# Patient Record
Sex: Female | Born: 1975
Health system: Southern US, Community
[De-identification: ages and names within clinical notes are randomized; demographics above are authoritative.]

## PROBLEM LIST (undated history)

## (undated) DIAGNOSIS — R7303 Prediabetes: Secondary | ICD-10-CM

## (undated) DIAGNOSIS — G4730 Sleep apnea, unspecified: Secondary | ICD-10-CM

## (undated) DIAGNOSIS — B009 Herpesviral infection, unspecified: Secondary | ICD-10-CM

## (undated) DIAGNOSIS — K5900 Constipation, unspecified: Secondary | ICD-10-CM

## (undated) DIAGNOSIS — M1990 Unspecified osteoarthritis, unspecified site: Secondary | ICD-10-CM

## (undated) DIAGNOSIS — N879 Dysplasia of cervix uteri, unspecified: Secondary | ICD-10-CM

## (undated) DIAGNOSIS — R0683 Snoring: Secondary | ICD-10-CM

## (undated) DIAGNOSIS — R87620 Atypical squamous cells of undetermined significance on cytologic smear of vagina (ASC-US): Secondary | ICD-10-CM

## (undated) DIAGNOSIS — M722 Plantar fascial fibromatosis: Secondary | ICD-10-CM

## (undated) DIAGNOSIS — E669 Obesity, unspecified: Secondary | ICD-10-CM

## (undated) DIAGNOSIS — K219 Gastro-esophageal reflux disease without esophagitis: Secondary | ICD-10-CM

## (undated) DIAGNOSIS — G43909 Migraine, unspecified, not intractable, without status migrainosus: Secondary | ICD-10-CM

## (undated) DIAGNOSIS — E559 Vitamin D deficiency, unspecified: Secondary | ICD-10-CM

## (undated) HISTORY — DX: Atypical squamous cells of undetermined significance on cytologic smear of vagina (ASC-US): R87.620

## (undated) HISTORY — PX: COLPOSCOPY: SHX161

## (undated) HISTORY — DX: Unspecified osteoarthritis, unspecified site: M19.90

## (undated) HISTORY — DX: Obesity, unspecified: E66.9

## (undated) HISTORY — PX: ENDOMETRIAL ABLATION: SHX621

## (undated) HISTORY — DX: Vitamin D deficiency, unspecified: E55.9

## (undated) HISTORY — DX: Dysplasia of cervix uteri, unspecified: N87.9

## (undated) HISTORY — PX: LEEP: SHX91

## (undated) HISTORY — DX: Migraine, unspecified, not intractable, without status migrainosus: G43.909

## (undated) HISTORY — DX: Herpesviral infection, unspecified: B00.9

## (undated) HISTORY — DX: Constipation, unspecified: K59.00

## (undated) HISTORY — PX: TUBAL LIGATION: SHX77

## (undated) HISTORY — DX: Plantar fascial fibromatosis: M72.2

## (undated) HISTORY — DX: Snoring: R06.83

---

## 1998-10-22 HISTORY — PX: TUBAL LIGATION: SHX77

## 2005-03-13 ENCOUNTER — Ambulatory Visit: Payer: Self-pay | Admitting: Orthopedic Surgery

## 2009-10-22 HISTORY — PX: ENDOMETRIAL ABLATION: SHX621

## 2010-10-12 ENCOUNTER — Ambulatory Visit: Payer: Self-pay

## 2011-11-30 ENCOUNTER — Ambulatory Visit: Payer: Self-pay | Admitting: Family Medicine

## 2012-09-22 ENCOUNTER — Other Ambulatory Visit: Payer: Self-pay

## 2012-09-22 LAB — COMPREHENSIVE METABOLIC PANEL
Alkaline Phosphatase: 98 U/L (ref 50–136)
BUN: 7 mg/dL (ref 7–18)
Bilirubin,Total: 0.4 mg/dL (ref 0.2–1.0)
Calcium, Total: 8.7 mg/dL (ref 8.5–10.1)
Co2: 26 mmol/L (ref 21–32)
Creatinine: 0.64 mg/dL (ref 0.60–1.30)
EGFR (African American): >60
EGFR (Non-African Amer.): >60
Glucose: 89 mg/dL (ref 65–99)
Osmolality: 277 (ref 275–301)
SGOT(AST): 20 U/L (ref 15–37)
SGPT (ALT): 17 U/L (ref 12–78)

## 2013-09-16 ENCOUNTER — Ambulatory Visit: Payer: Self-pay | Admitting: Family Medicine

## 2013-09-21 LAB — HM PAP SMEAR: HM Pap smear: NORMAL

## 2013-09-23 ENCOUNTER — Other Ambulatory Visit: Payer: Self-pay

## 2013-09-23 LAB — LIPID PANEL
Cholesterol: 143 mg/dL (ref 0–200)
HDL Cholesterol: 56 mg/dL (ref 40–60)
Triglycerides: 165 mg/dL (ref 0–200)
VLDL Cholesterol, Calc: 33 mg/dL (ref 5–40)

## 2013-09-23 LAB — COMPREHENSIVE METABOLIC PANEL
Alkaline Phosphatase: 68 U/L
Anion Gap: 4 — ABNORMAL LOW (ref 7–16)
Bilirubin,Total: 0.5 mg/dL (ref 0.2–1.0)
Creatinine: 0.6 mg/dL (ref 0.60–1.30)
Glucose: 91 mg/dL (ref 65–99)
SGPT (ALT): 15 U/L (ref 12–78)
Total Protein: 6.9 g/dL (ref 6.4–8.2)

## 2014-09-29 ENCOUNTER — Ambulatory Visit: Payer: Self-pay

## 2014-09-29 LAB — COMPREHENSIVE METABOLIC PANEL
ALK PHOS: 71 U/L
ANION GAP: 6 — AB (ref 7–16)
Albumin: 3.4 g/dL (ref 3.4–5.0)
BUN: 8 mg/dL (ref 7–18)
Bilirubin,Total: 0.5 mg/dL (ref 0.2–1.0)
CALCIUM: 8.2 mg/dL — AB (ref 8.5–10.1)
Chloride: 107 mmol/L (ref 98–107)
Co2: 28 mmol/L (ref 21–32)
Creatinine: 0.69 mg/dL (ref 0.60–1.30)
EGFR (African American): >60
GLUCOSE: 84 mg/dL (ref 65–99)
OSMOLALITY: 279 (ref 275–301)
Potassium: 4.1 mmol/L (ref 3.5–5.1)
SGOT(AST): 18 U/L (ref 15–37)
SGPT (ALT): 15 U/L
SODIUM: 141 mmol/L (ref 136–145)
Total Protein: 7 g/dL (ref 6.4–8.2)

## 2014-09-29 LAB — TSH: Thyroid Stimulating Horm: 1.86 u[IU]/mL

## 2014-09-29 LAB — LIPID PANEL
Cholesterol: 157 mg/dL (ref 0–200)
HDL: 52 mg/dL (ref 40–60)
Ldl Cholesterol, Calc: 83 mg/dL (ref 0–100)
Triglycerides: 108 mg/dL (ref 0–200)
VLDL Cholesterol, Calc: 22 mg/dL (ref 5–40)

## 2014-10-01 LAB — LIPID PANEL
CHOLESTEROL: 157 mg/dL (ref 0–200)
HDL: 52 mg/dL (ref 35–70)
LDL CALC: 83 mg/dL
TRIGLYCERIDES: 108 mg/dL (ref 40–160)

## 2014-11-17 ENCOUNTER — Ambulatory Visit: Payer: Self-pay

## 2014-11-18 LAB — HM MAMMOGRAPHY

## 2015-02-18 ENCOUNTER — Encounter: Payer: Self-pay | Admitting: *Deleted

## 2015-04-15 ENCOUNTER — Other Ambulatory Visit: Payer: Self-pay | Admitting: Family Medicine

## 2015-04-15 ENCOUNTER — Telehealth: Payer: Self-pay

## 2015-04-15 MED ORDER — OMEPRAZOLE 40 MG PO CPDR: 40.0000 mg | DELAYED_RELEASE_CAPSULE | Freq: Every day | ORAL | Status: DC

## 2015-04-15 NOTE — Telephone Encounter (Signed)
Patient is requesting refill of Omeprazole. Also states she is ready to try another weight loss medication and states she needs it due to no energy; as Dr. Ancil Boozer and patient discuss during last visit.

## 2015-04-15 NOTE — Telephone Encounter (Signed)
Sent omeprazole but I can't change weight loss medication without seeing her

## 2015-04-15 NOTE — Telephone Encounter (Signed)
Patient states she spoke with Dr. Ancil Boozer on her last visit on 03/03/15, and there is no open appointments for a couple of weeks.

## 2015-05-04 ENCOUNTER — Ambulatory Visit (INDEPENDENT_AMBULATORY_CARE_PROVIDER_SITE_OTHER): Payer: 59 | Admitting: Family Medicine

## 2015-05-04 ENCOUNTER — Encounter: Payer: Self-pay | Admitting: Family Medicine

## 2015-05-04 VITALS — BP 112/68 | HR 86 | Temp 98.1°F | Resp 16 | Ht 62.0 in | Wt 183.0 lb

## 2015-05-04 DIAGNOSIS — G43009 Migraine without aura, not intractable, without status migrainosus: Secondary | ICD-10-CM

## 2015-05-04 DIAGNOSIS — K219 Gastro-esophageal reflux disease without esophagitis: Secondary | ICD-10-CM

## 2015-05-04 DIAGNOSIS — E669 Obesity, unspecified: Secondary | ICD-10-CM | POA: Diagnosis not present

## 2015-05-04 DIAGNOSIS — J3089 Other allergic rhinitis: Secondary | ICD-10-CM | POA: Diagnosis not present

## 2015-05-04 DIAGNOSIS — R0683 Snoring: Secondary | ICD-10-CM | POA: Insufficient documentation

## 2015-05-04 DIAGNOSIS — E66811 Obesity, class 1: Secondary | ICD-10-CM | POA: Insufficient documentation

## 2015-05-04 DIAGNOSIS — E559 Vitamin D deficiency, unspecified: Secondary | ICD-10-CM | POA: Insufficient documentation

## 2015-05-04 DIAGNOSIS — M722 Plantar fascial fibromatosis: Secondary | ICD-10-CM | POA: Insufficient documentation

## 2015-05-04 MED ORDER — PHENTERMINE HCL 37.5 MG PO CAPS: 37.5000 mg | ORAL_CAPSULE | ORAL | Status: DC

## 2015-05-04 NOTE — Progress Notes (Signed)
Name: Lori Beltran   MRN: [id number]    DOB: 1975-10-26   Date:05/04/2015       Progress Note  Subjective  Chief Complaint  Chief Complaint  Patient presents with  . Obesity    wants to try another weight loss med    HPI  Migraine headaches: episodes not as frequent now. Last episode one month ago. Tried Topamax but she stopped because it caused tingling. Taking otc medication, Triptans did not work for her.   Obesity: she has struggling with her weight for the past 10 years. She has tried Archivist without success. She took Adipex before her wedding last year and lost about 30 lbs and would like to try it again.    Environmental Allergies/Hives: she has been seeing Dr. Donneta Romberg, and since started allergy shots about 2 months ago she has not had any hives  Patient Active Problem List   Diagnosis Date Noted  . Migraine without aura and without status migrainosus, not intractable 05/04/2015  . Environmental and seasonal allergies 05/04/2015  . Obesity (BMI 30.0-34.9) 05/04/2015  . Plantar fasciitis of left foot 05/04/2015  . Vitamin D deficiency 05/04/2015  . Snoring 05/04/2015    Past Surgical History  Procedure Laterality Date  . Tubal ligation    . Endometrial ablation      Family History  Problem Relation Age of Onset  . Sleep apnea Mother   . Sleep apnea Father     History   Social History  . Marital Status: Divorced    Spouse Name: N/A  . Number of Children: N/A  . Years of Education: N/A   Occupational History  . Not on file.   Social History Main Topics  . Smoking status: Never Smoker   . Smokeless tobacco: Never Used  . Alcohol Use: No  . Drug Use: No  . Sexual Activity: Yes   Other Topics Concern  . Not on file   Social History Narrative  . No narrative on file     Current outpatient prescriptions:  .  omeprazole (PRILOSEC) 40 MG capsule, Take 1 capsule (40 mg total) by mouth daily., Disp: 30 capsule, Rfl: 2 .  phentermine  37.5 MG capsule, Take 1 capsule (37.5 mg total) by mouth every morning., Disp: 30 capsule, Rfl: 0  No Known Allergies   ROS  Constitutional: Negative for fever but positive for  weight change.  Respiratory: Negative for cough and shortness of breath.   Cardiovascular: Negative for chest pain or palpitations.  Gastrointestinal: Negative for abdominal pain, no bowel changes.  Musculoskeletal: Negative for gait problem or joint swelling.  Skin: Negative for rash.  Neurological: Negative for dizziness or headache.  No other specific complaints in a complete review of systems (except as listed in HPI above).  Objective  Filed Vitals:   05/04/15 1052  BP: 112/68  Pulse: 86  Temp: 98.1 F (36.7 C)  TempSrc: Oral  Resp: 16  Height: 5\' 2"  (1.575 m)  Weight: 183 lb (83.008 kg)  SpO2: 96%    Body mass index is 33.46 kg/(m^2).  Physical Exam   Constitutional: Patient appears well-developed and well-nourished. No distress. Obese Eyes:  No scleral icterus. PERL Neck: Normal range of motion. Neck supple. No thyromegaly Cardiovascular: Normal rate, regular rhythm and normal heart sounds.  No murmur heard. No BLE edema. Pulmonary/Chest: Effort normal and breath sounds normal. No respiratory distress. Abdominal: Soft.  There is no tenderness. Psychiatric: Patient has a normal mood and affect.  behavior is normal. Judgment and thought content normal.  PHQ2/9: Depression screen PHQ 2/9 05/04/2015  Decreased Interest 0  Down, Depressed, Hopeless 0  PHQ - 2 Score 0     Fall Risk: Fall Risk  05/04/2015  Falls in the past year? No     Assessment & Plan  1. Migraine without aura and without status migrainosus, not intractable Continue prn otc Excedrin migraine  2. Environmental and seasonal allergies Continue follow up with Dr. Donneta Romberg and allergy shots  3. Obesity (BMI 30.0-34.9)  we will try phentermine again, discussed possible side effects and importance of combining with  diet and exercise - phentermine 37.5 MG capsule; Take 1 capsule (37.5 mg total) by mouth every morning.  Dispense: 30 capsule; Refill: 0

## 2015-05-04 NOTE — Patient Instructions (Signed)

## 2015-07-13 ENCOUNTER — Ambulatory Visit: Payer: 59 | Admitting: Family Medicine

## 2015-07-15 ENCOUNTER — Ambulatory Visit (INDEPENDENT_AMBULATORY_CARE_PROVIDER_SITE_OTHER): Payer: 59 | Admitting: Family Medicine

## 2015-07-15 ENCOUNTER — Encounter: Payer: Self-pay | Admitting: Family Medicine

## 2015-07-15 VITALS — BP 118/68 | HR 88 | Temp 98.1°F | Resp 18 | Ht 62.0 in | Wt 174.7 lb

## 2015-07-15 DIAGNOSIS — J3089 Other allergic rhinitis: Secondary | ICD-10-CM

## 2015-07-15 DIAGNOSIS — G43009 Migraine without aura, not intractable, without status migrainosus: Secondary | ICD-10-CM

## 2015-07-15 DIAGNOSIS — K5900 Constipation, unspecified: Secondary | ICD-10-CM | POA: Diagnosis not present

## 2015-07-15 DIAGNOSIS — Z862 Personal history of diseases of the blood and blood-forming organs and certain disorders involving the immune mechanism: Secondary | ICD-10-CM | POA: Insufficient documentation

## 2015-07-15 DIAGNOSIS — B009 Herpesviral infection, unspecified: Secondary | ICD-10-CM | POA: Insufficient documentation

## 2015-07-15 DIAGNOSIS — E669 Obesity, unspecified: Secondary | ICD-10-CM

## 2015-07-15 DIAGNOSIS — K5909 Other constipation: Secondary | ICD-10-CM

## 2015-07-15 MED ORDER — PHENTERMINE HCL 37.5 MG PO CAPS: 37.5000 mg | ORAL_CAPSULE | ORAL | Status: DC

## 2015-07-15 NOTE — Progress Notes (Signed)
Name: Lori Beltran   MRN: [id number]    DOB: 07-19-1976   Date:07/15/2015       Progress Note  Subjective  Chief Complaint  Chief Complaint  Patient presents with  . Medication Refill  . Obesity    Doing well on Phentermine, and losted 3 more pounds since last visit. Gives patient more energy.  . Allergic Rhinitis     Well controlled  . Constipation    Uses Linzess as needs goes 2-3 weekly.  . Migraine    Has not had one recently, takes medication prn and will take Excedrin as a headache comes on  . Gastrophageal Reflux    Well controlled with medication, takes PRN    HPI  Migraine Headaches: episodes have been less frequent, no episodes in the past 2 months.  She states when she has an episode they are described as starting on nuchal area or the top of her head, described as sharp, associated with nausea, and occasional vomiting. She has phonophobia and photophobia. She has been taking prn Excedrin Migraine.  Environmental allergies and one episode of angioedema: seeing Dr. Donneta Romberg she is having allergy shots since January 2016. Feeling better. No recent angioedema  Obesity: she was given a prescription for Adipex in July, but started a week later and her starting weight was 181 lbs, she has lost 5 lbs since she started on medication ( 5% weight loss is 9 lbs ) . She takes medications 5 days weekly. She states she has noticed some dry mouth and increase in constipation, however is taking Linzess and is controlling the bowel movement frequency - 3 times weekly - takes Linzess prn   GERD: under control, she has intermittent heartburn, and takes medication prn. Symptoms less than once week.    Patient Active Problem List   Diagnosis Date Noted  . 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 Chronic constipation 07/15/2015  . History of iron deficiency anemia 07/15/2015  . Migraine without aura and without status migrainosus, not intractable 05/04/2015  . Environmental and  seasonal allergies 05/04/2015  . Obesity (BMI 30.0-34.9) 05/04/2015  . Plantar fasciitis of left foot 05/04/2015  . Vitamin D deficiency 05/04/2015  . Snoring 05/04/2015  . GERD (gastroesophageal reflux disease) 05/04/2015    Past Surgical History  Procedure Laterality Date  . Tubal ligation    . Endometrial ablation      Family History  Problem Relation Age of Onset  . Sleep apnea Mother   . Sleep apnea Father   . Allergic rhinitis Daughter   . Allergic rhinitis Son     Social History   Social History  . Marital Status: Married    Spouse Name: N/A  . Number of Children: N/A  . Years of Education: N/A   Occupational History  . Not on file.   Social History Main Topics  . Smoking status: Never Smoker   . Smokeless tobacco: Never Used  . Alcohol Use: No  . Drug Use: No  . Sexual Activity:    Partners: Male   Other Topics Concern  . Not on file   Social History Narrative     Current outpatient prescriptions:  .  EPINEPHrine (EPIPEN 2-PAK) 0.3 mg/0.3 mL IJ SOAJ injection, EPIPEN 2-PAK, 0.3MG /0.3ML (Injection Solution Auto-injector)  1 (one) Soln Auto-inj Soln Auto-inj prn anaphylaxis for 30 days  Quantity: 2;  Refills: 0   Ordered :11-Oct-2014  Cathrine Muster ;  Started 21-Sep-2014 Active, Disp: , Rfl:  .  hydrOXYzine (  ATARAX/VISTARIL) 25 MG tablet, Take by mouth., Disp: , Rfl:  .  Linaclotide (LINZESS) 145 MCG CAPS capsule, Take by mouth., Disp: , Rfl:  .  loratadine (CLARITIN) 10 MG tablet, Take by mouth., Disp: , Rfl:  .  meloxicam (MOBIC) 15 MG tablet, Take by mouth., Disp: , Rfl:  .  Multiple Vitamins-Minerals (MULTIVITAL) tablet, Take by mouth., Disp: , Rfl:  .  omeprazole (PRILOSEC) 40 MG capsule, Take 1 capsule (40 mg total) by mouth daily., Disp: 30 capsule, Rfl: 2 .  phentermine 37.5 MG capsule, Take 1 capsule (37.5 mg total) by mouth every morning., Disp: 30 capsule, Rfl: 0 .  ranitidine (ZANTAC) 150 MG tablet, Take by mouth., Disp: , Rfl:  .   SUMAtriptan (IMITREX) 100 MG tablet, Take by mouth., Disp: , Rfl:  .  valACYclovir (VALTREX) 500 MG tablet, , Disp: , Rfl: 12  No Known Allergies   ROS  Constitutional: Negative for fever . Positive for  weight change.  Respiratory: Negative for cough and shortness of breath.   Cardiovascular: Negative for chest pain or palpitations.  Gastrointestinal: Negative for abdominal pain, no bowel changes.  Musculoskeletal: Negative for gait problem or joint swelling.  Skin: Negative for rash.  Neurological: Negative for dizziness, intermittent  headache.  No other specific complaints in a complete review of systems (except as listed in HPI above).  Objective  Filed Vitals:   07/15/15 0907  BP: 118/68  Pulse: 88  Temp: 98.1 F (36.7 C)  TempSrc: Oral  Resp: 18  Height: 5\' 2"  (1.575 m)  Weight: 174 lb 11.2 oz (79.243 kg)  SpO2: 97%    Body mass index is 31.94 kg/(m^2).  Physical Exam  Constitutional: Patient appears well-developed and well-nourished. Obese No distress.  HEENT: head atraumatic, normocephalic, pupils equal and reactive to light, neck supple, throat within normal limits Cardiovascular: Normal rate, regular rhythm and normal heart sounds.  No murmur heard. No BLE edema. Pulmonary/Chest: Effort normal and breath sounds normal. No respiratory distress. Abdominal: Soft.  There is no tenderness. Psychiatric: Patient has a normal mood and affect. behavior is normal. Judgment and thought content normal.  PHQ2/9: Depression screen [facility] 2/9 07/15/2015 05/04/2015  Decreased Interest 0 0  Down, Depressed, Hopeless 0 0  PHQ - 2 Score 0 0    Fall Risk: Fall Risk  07/15/2015 05/04/2015  Falls in the past year? No No    Functional Status Survey: Is the patient deaf or have difficulty hearing?: No Does the patient have difficulty seeing, even when wearing glasses/contacts?: Yes (glasses) Does the patient have difficulty concentrating, remembering, or making decisions?:  No Does the patient have difficulty walking or climbing stairs?: No Does the patient have difficulty dressing or bathing?: No Does the patient have difficulty doing errands alone such as visiting a doctor's office or shopping?: No    Assessment & Plan  1. Migraine without aura and without status migrainosus, not intractable  Doing well, on prn Excedrin, no recent episodes  2. Obesity (BMI 30.0-34.9)  Doing well on medication, advised to increase water intake and try to take Linzess more often to control constipation  - phentermine 37.5 MG capsule; Take 1 capsule (37.5 mg total) by mouth every morning.  Dispense: 30 capsule; Refill: 0  3. Chronic constipation  Still has Linzess at home  4. Environmental and seasonal allergies  Continue follow up with Dr. Donneta Romberg

## 2015-08-24 ENCOUNTER — Other Ambulatory Visit: Payer: Self-pay

## 2015-08-24 ENCOUNTER — Other Ambulatory Visit: Payer: Self-pay | Admitting: Family Medicine

## 2015-08-24 MED ORDER — POLYETHYLENE GLYCOL 3350 17 GM/SCOOP PO POWD: 17.0000 g | Freq: Two times a day (BID) | ORAL | Status: DC | PRN

## 2015-08-24 MED ORDER — LINACLOTIDE 145 MCG PO CAPS: 145.0000 ug | ORAL_CAPSULE | Freq: Every day | ORAL | Status: DC

## 2015-08-24 NOTE — Telephone Encounter (Signed)
Patient requesting refill, Not sure if this is the right mirlax? It was under historical in her old chart?

## 2015-10-10 ENCOUNTER — Other Ambulatory Visit
Admission: RE | Admit: 2015-10-10 | Discharge: 2015-10-10 | Disposition: A | Discharge status: Home / Self Care | Payer: 59 | Source: Ambulatory Visit | Attending: Obstetrics and Gynecology | Admitting: Obstetrics and Gynecology

## 2015-10-10 DIAGNOSIS — Z0000 Encounter for general adult medical examination without abnormal findings: Secondary | ICD-10-CM | POA: Insufficient documentation

## 2015-10-10 DIAGNOSIS — E559 Vitamin D deficiency, unspecified: Secondary | ICD-10-CM | POA: Insufficient documentation

## 2015-10-10 LAB — LIPID PANEL
CHOL/HDL RATIO: 3.7 ratio
CHOLESTEROL: 155 mg/dL (ref 0–200)
HDL: 42 mg/dL (ref >40)
LDL CALC: 94 mg/dL (ref 0–99)
TRIGLYCERIDES: 96 mg/dL (ref <150)
VLDL: 19 mg/dL (ref 0–40)

## 2015-10-10 LAB — HEMOGLOBIN AND HEMATOCRIT, BLOOD
HCT: 37.7 % (ref 35.0–47.0)
Hemoglobin: 12.4 g/dL (ref 12.0–16.0)

## 2015-10-11 LAB — HIV ANTIBODY (ROUTINE TESTING W REFLEX): HIV Screen 4th Generation wRfx: NONREACTIVE

## 2015-10-11 LAB — HEPATITIS C ANTIBODY

## 2015-10-11 LAB — VITAMIN D 25 HYDROXY (VIT D DEFICIENCY, FRACTURES): Vit D, 25-Hydroxy: 17.5 ng/mL — ABNORMAL LOW (ref 30.0–100.0)

## 2015-10-11 LAB — RPR: RPR: NONREACTIVE

## 2015-10-12 LAB — HM PAP SMEAR: HM PAP: NEGATIVE

## 2015-10-27 DIAGNOSIS — J3089 Other allergic rhinitis: Secondary | ICD-10-CM | POA: Diagnosis not present

## 2015-10-27 DIAGNOSIS — J3081 Allergic rhinitis due to animal (cat) (dog) hair and dander: Secondary | ICD-10-CM | POA: Diagnosis not present

## 2015-10-27 DIAGNOSIS — J301 Allergic rhinitis due to pollen: Secondary | ICD-10-CM | POA: Diagnosis not present

## 2015-11-03 DIAGNOSIS — J301 Allergic rhinitis due to pollen: Secondary | ICD-10-CM | POA: Diagnosis not present

## 2015-11-03 DIAGNOSIS — J3081 Allergic rhinitis due to animal (cat) (dog) hair and dander: Secondary | ICD-10-CM | POA: Diagnosis not present

## 2015-11-03 DIAGNOSIS — J3089 Other allergic rhinitis: Secondary | ICD-10-CM | POA: Diagnosis not present

## 2015-11-10 DIAGNOSIS — J3081 Allergic rhinitis due to animal (cat) (dog) hair and dander: Secondary | ICD-10-CM | POA: Diagnosis not present

## 2015-11-10 DIAGNOSIS — J3089 Other allergic rhinitis: Secondary | ICD-10-CM | POA: Diagnosis not present

## 2015-11-10 DIAGNOSIS — J301 Allergic rhinitis due to pollen: Secondary | ICD-10-CM | POA: Diagnosis not present

## 2015-11-15 DIAGNOSIS — J301 Allergic rhinitis due to pollen: Secondary | ICD-10-CM | POA: Diagnosis not present

## 2015-11-15 DIAGNOSIS — J3081 Allergic rhinitis due to animal (cat) (dog) hair and dander: Secondary | ICD-10-CM | POA: Diagnosis not present

## 2015-11-15 DIAGNOSIS — J3089 Other allergic rhinitis: Secondary | ICD-10-CM | POA: Diagnosis not present

## 2015-11-22 ENCOUNTER — Other Ambulatory Visit: Payer: Self-pay | Admitting: Family Medicine

## 2015-11-22 DIAGNOSIS — Z1231 Encounter for screening mammogram for malignant neoplasm of breast: Secondary | ICD-10-CM

## 2015-11-22 DIAGNOSIS — J3089 Other allergic rhinitis: Secondary | ICD-10-CM | POA: Diagnosis not present

## 2015-11-22 DIAGNOSIS — J3081 Allergic rhinitis due to animal (cat) (dog) hair and dander: Secondary | ICD-10-CM | POA: Diagnosis not present

## 2015-11-22 DIAGNOSIS — J301 Allergic rhinitis due to pollen: Secondary | ICD-10-CM | POA: Diagnosis not present

## 2015-11-29 DIAGNOSIS — J3081 Allergic rhinitis due to animal (cat) (dog) hair and dander: Secondary | ICD-10-CM | POA: Diagnosis not present

## 2015-11-29 DIAGNOSIS — J3089 Other allergic rhinitis: Secondary | ICD-10-CM | POA: Diagnosis not present

## 2015-11-29 DIAGNOSIS — J301 Allergic rhinitis due to pollen: Secondary | ICD-10-CM | POA: Diagnosis not present

## 2015-12-01 ENCOUNTER — Telehealth: Payer: Self-pay

## 2015-12-01 MED ORDER — FLUTICASONE PROPIONATE 50 MCG/ACT NA SUSP: 2.0000 | Freq: Every day | NASAL | Status: DC

## 2015-12-01 NOTE — Telephone Encounter (Signed)
Patient would like to know if you could call her in a nasal spray due to nasal congestion? Thanks

## 2015-12-01 NOTE — Telephone Encounter (Signed)
Patient notified

## 2015-12-01 NOTE — Telephone Encounter (Signed)
done

## 2015-12-02 ENCOUNTER — Encounter: Payer: 59 | Attending: Family Medicine | Admitting: Dietician

## 2015-12-02 VITALS — Ht 61.0 in | Wt 174.8 lb

## 2015-12-02 DIAGNOSIS — E669 Obesity, unspecified: Secondary | ICD-10-CM

## 2015-12-02 DIAGNOSIS — Z713 Dietary counseling and surveillance: Secondary | ICD-10-CM | POA: Insufficient documentation

## 2015-12-02 NOTE — Progress Notes (Signed)
Notes from [employer] employee "self referral" nutrition session: Start time: 0900   End time: 1000  Met with employee to discuss her nutritional concerns and diet history. The employee's questions/concerns were also addressed.  We discussed the following topics:  Healthy Eating  Exercise  Weight Concerns  I also provided the following handouts as reinforcement of the educational session:  Planning a Balanced Meal  Sample menus and/or recipes: Quick and Healthy Meal Ideas, Smart Snacking   Physical activity: walking 20-30 min, treadmill or elliptical 40 min,3-4 times a week  Dietary Intake:  Usual eating pattern includes 3 meals and 2 snacks per day. Dining out frequency: 4 meals per week.  Breakfast: yoplait yogurt Snack: carrots or trail mix or grapes or dry cereal Lunch: salad or varitey of meats i.e fried chicken or baked chicken or salmon. Food brought in by drug reps 4x a week.   Other day: sandwich, carrots Snack: same as am  Supper: [place] burgers, boiled/baked chicken. Limits red meats. Salmon pattie. Frozen vegetables, occasional rice. Decreased pasta Snack: none Beverages: 80oz water daily. Used to drink tea at work, but has now stopped  Encouraged tracking food intake and regular weighing.  Set goals with patient input.

## 2015-12-02 NOTE — Patient Instructions (Signed)
   Include fruits and vegetables often during the day.   Use food lists and your meal plan to eat balanced meals. Include palm-size or less meat portions, fist-size or less for starch portions.   Limit added fats such as salad dressings, mayo, butter, creamy dips or sauces, bacon.  Keep up with your regular exercise -- great job so far!!  Track food intake using phone app such as MyFitnessPal or Lose It. Or use the LiveLifeWell website. At the very least keep a paper diary of what you eat.  Check weight regularly, ideally every day. Think of the weight as just a number, not something that defines who you are.

## 2015-12-06 ENCOUNTER — Ambulatory Visit
Admission: RE | Admit: 2015-12-06 | Discharge: 2015-12-06 | Disposition: A | Discharge status: Home / Self Care | Payer: 59 | Source: Ambulatory Visit | Attending: Family Medicine | Admitting: Family Medicine

## 2015-12-06 DIAGNOSIS — Z1231 Encounter for screening mammogram for malignant neoplasm of breast: Secondary | ICD-10-CM | POA: Diagnosis not present

## 2015-12-06 DIAGNOSIS — J3081 Allergic rhinitis due to animal (cat) (dog) hair and dander: Secondary | ICD-10-CM | POA: Diagnosis not present

## 2015-12-06 DIAGNOSIS — J3089 Other allergic rhinitis: Secondary | ICD-10-CM | POA: Diagnosis not present

## 2015-12-06 DIAGNOSIS — J301 Allergic rhinitis due to pollen: Secondary | ICD-10-CM | POA: Diagnosis not present

## 2015-12-15 DIAGNOSIS — J301 Allergic rhinitis due to pollen: Secondary | ICD-10-CM | POA: Diagnosis not present

## 2015-12-15 DIAGNOSIS — J3089 Other allergic rhinitis: Secondary | ICD-10-CM | POA: Diagnosis not present

## 2015-12-15 DIAGNOSIS — J3081 Allergic rhinitis due to animal (cat) (dog) hair and dander: Secondary | ICD-10-CM | POA: Diagnosis not present

## 2015-12-20 DIAGNOSIS — J301 Allergic rhinitis due to pollen: Secondary | ICD-10-CM | POA: Diagnosis not present

## 2015-12-20 DIAGNOSIS — J3089 Other allergic rhinitis: Secondary | ICD-10-CM | POA: Diagnosis not present

## 2015-12-20 DIAGNOSIS — J3081 Allergic rhinitis due to animal (cat) (dog) hair and dander: Secondary | ICD-10-CM | POA: Diagnosis not present

## 2015-12-27 DIAGNOSIS — J3089 Other allergic rhinitis: Secondary | ICD-10-CM | POA: Diagnosis not present

## 2015-12-27 DIAGNOSIS — J3081 Allergic rhinitis due to animal (cat) (dog) hair and dander: Secondary | ICD-10-CM | POA: Diagnosis not present

## 2015-12-27 DIAGNOSIS — J301 Allergic rhinitis due to pollen: Secondary | ICD-10-CM | POA: Diagnosis not present

## 2016-01-03 DIAGNOSIS — J3089 Other allergic rhinitis: Secondary | ICD-10-CM | POA: Diagnosis not present

## 2016-01-03 DIAGNOSIS — J3081 Allergic rhinitis due to animal (cat) (dog) hair and dander: Secondary | ICD-10-CM | POA: Diagnosis not present

## 2016-01-03 DIAGNOSIS — J301 Allergic rhinitis due to pollen: Secondary | ICD-10-CM | POA: Diagnosis not present

## 2016-01-10 DIAGNOSIS — J3089 Other allergic rhinitis: Secondary | ICD-10-CM | POA: Diagnosis not present

## 2016-01-10 DIAGNOSIS — J3081 Allergic rhinitis due to animal (cat) (dog) hair and dander: Secondary | ICD-10-CM | POA: Diagnosis not present

## 2016-01-10 DIAGNOSIS — J301 Allergic rhinitis due to pollen: Secondary | ICD-10-CM | POA: Diagnosis not present

## 2016-01-17 DIAGNOSIS — J3089 Other allergic rhinitis: Secondary | ICD-10-CM | POA: Diagnosis not present

## 2016-01-17 DIAGNOSIS — J301 Allergic rhinitis due to pollen: Secondary | ICD-10-CM | POA: Diagnosis not present

## 2016-01-17 DIAGNOSIS — J3081 Allergic rhinitis due to animal (cat) (dog) hair and dander: Secondary | ICD-10-CM | POA: Diagnosis not present

## 2016-01-26 DIAGNOSIS — J301 Allergic rhinitis due to pollen: Secondary | ICD-10-CM | POA: Diagnosis not present

## 2016-01-26 DIAGNOSIS — J3089 Other allergic rhinitis: Secondary | ICD-10-CM | POA: Diagnosis not present

## 2016-01-26 DIAGNOSIS — J3081 Allergic rhinitis due to animal (cat) (dog) hair and dander: Secondary | ICD-10-CM | POA: Diagnosis not present

## 2016-02-02 DIAGNOSIS — J3081 Allergic rhinitis due to animal (cat) (dog) hair and dander: Secondary | ICD-10-CM | POA: Diagnosis not present

## 2016-02-02 DIAGNOSIS — J3089 Other allergic rhinitis: Secondary | ICD-10-CM | POA: Diagnosis not present

## 2016-02-02 DIAGNOSIS — J301 Allergic rhinitis due to pollen: Secondary | ICD-10-CM | POA: Diagnosis not present

## 2016-02-07 DIAGNOSIS — J301 Allergic rhinitis due to pollen: Secondary | ICD-10-CM | POA: Diagnosis not present

## 2016-02-07 DIAGNOSIS — J3081 Allergic rhinitis due to animal (cat) (dog) hair and dander: Secondary | ICD-10-CM | POA: Diagnosis not present

## 2016-02-07 DIAGNOSIS — J3089 Other allergic rhinitis: Secondary | ICD-10-CM | POA: Diagnosis not present

## 2016-02-17 DIAGNOSIS — J301 Allergic rhinitis due to pollen: Secondary | ICD-10-CM | POA: Diagnosis not present

## 2016-02-17 DIAGNOSIS — J3089 Other allergic rhinitis: Secondary | ICD-10-CM | POA: Diagnosis not present

## 2016-02-17 DIAGNOSIS — J3081 Allergic rhinitis due to animal (cat) (dog) hair and dander: Secondary | ICD-10-CM | POA: Diagnosis not present

## 2016-02-23 DIAGNOSIS — J3081 Allergic rhinitis due to animal (cat) (dog) hair and dander: Secondary | ICD-10-CM | POA: Diagnosis not present

## 2016-02-23 DIAGNOSIS — J301 Allergic rhinitis due to pollen: Secondary | ICD-10-CM | POA: Diagnosis not present

## 2016-02-23 DIAGNOSIS — J3089 Other allergic rhinitis: Secondary | ICD-10-CM | POA: Diagnosis not present

## 2016-02-28 DIAGNOSIS — J3089 Other allergic rhinitis: Secondary | ICD-10-CM | POA: Diagnosis not present

## 2016-02-28 DIAGNOSIS — J301 Allergic rhinitis due to pollen: Secondary | ICD-10-CM | POA: Diagnosis not present

## 2016-02-28 DIAGNOSIS — J3081 Allergic rhinitis due to animal (cat) (dog) hair and dander: Secondary | ICD-10-CM | POA: Diagnosis not present

## 2016-03-01 DIAGNOSIS — J3089 Other allergic rhinitis: Secondary | ICD-10-CM | POA: Diagnosis not present

## 2016-03-01 DIAGNOSIS — J301 Allergic rhinitis due to pollen: Secondary | ICD-10-CM | POA: Diagnosis not present

## 2016-03-01 DIAGNOSIS — J3081 Allergic rhinitis due to animal (cat) (dog) hair and dander: Secondary | ICD-10-CM | POA: Diagnosis not present

## 2016-03-06 DIAGNOSIS — J3081 Allergic rhinitis due to animal (cat) (dog) hair and dander: Secondary | ICD-10-CM | POA: Diagnosis not present

## 2016-03-06 DIAGNOSIS — J301 Allergic rhinitis due to pollen: Secondary | ICD-10-CM | POA: Diagnosis not present

## 2016-03-06 DIAGNOSIS — J3089 Other allergic rhinitis: Secondary | ICD-10-CM | POA: Diagnosis not present

## 2016-03-09 DIAGNOSIS — J301 Allergic rhinitis due to pollen: Secondary | ICD-10-CM | POA: Diagnosis not present

## 2016-03-09 DIAGNOSIS — J3089 Other allergic rhinitis: Secondary | ICD-10-CM | POA: Diagnosis not present

## 2016-03-09 DIAGNOSIS — J3081 Allergic rhinitis due to animal (cat) (dog) hair and dander: Secondary | ICD-10-CM | POA: Diagnosis not present

## 2016-03-13 DIAGNOSIS — J301 Allergic rhinitis due to pollen: Secondary | ICD-10-CM | POA: Diagnosis not present

## 2016-03-13 DIAGNOSIS — J3089 Other allergic rhinitis: Secondary | ICD-10-CM | POA: Diagnosis not present

## 2016-03-13 DIAGNOSIS — J3081 Allergic rhinitis due to animal (cat) (dog) hair and dander: Secondary | ICD-10-CM | POA: Diagnosis not present

## 2016-03-20 DIAGNOSIS — J301 Allergic rhinitis due to pollen: Secondary | ICD-10-CM | POA: Diagnosis not present

## 2016-03-20 DIAGNOSIS — J3089 Other allergic rhinitis: Secondary | ICD-10-CM | POA: Diagnosis not present

## 2016-03-20 DIAGNOSIS — J3081 Allergic rhinitis due to animal (cat) (dog) hair and dander: Secondary | ICD-10-CM | POA: Diagnosis not present

## 2016-03-27 DIAGNOSIS — J3089 Other allergic rhinitis: Secondary | ICD-10-CM | POA: Diagnosis not present

## 2016-03-27 DIAGNOSIS — J301 Allergic rhinitis due to pollen: Secondary | ICD-10-CM | POA: Diagnosis not present

## 2016-03-27 DIAGNOSIS — J3081 Allergic rhinitis due to animal (cat) (dog) hair and dander: Secondary | ICD-10-CM | POA: Diagnosis not present

## 2016-03-29 ENCOUNTER — Other Ambulatory Visit: Payer: Self-pay | Admitting: Family Medicine

## 2016-03-29 ENCOUNTER — Ambulatory Visit (INDEPENDENT_AMBULATORY_CARE_PROVIDER_SITE_OTHER): Payer: 59 | Admitting: Family Medicine

## 2016-03-29 ENCOUNTER — Encounter: Payer: Self-pay | Admitting: Family Medicine

## 2016-03-29 VITALS — BP 114/66 | HR 92 | Temp 97.5°F | Resp 16 | Ht 61.0 in | Wt 163.8 lb

## 2016-03-29 DIAGNOSIS — K5909 Other constipation: Secondary | ICD-10-CM

## 2016-03-29 DIAGNOSIS — K5900 Constipation, unspecified: Secondary | ICD-10-CM

## 2016-03-29 DIAGNOSIS — G43009 Migraine without aura, not intractable, without status migrainosus: Secondary | ICD-10-CM

## 2016-03-29 DIAGNOSIS — K219 Gastro-esophageal reflux disease without esophagitis: Secondary | ICD-10-CM

## 2016-03-29 DIAGNOSIS — E559 Vitamin D deficiency, unspecified: Secondary | ICD-10-CM

## 2016-03-29 DIAGNOSIS — E669 Obesity, unspecified: Secondary | ICD-10-CM

## 2016-03-29 DIAGNOSIS — Z862 Personal history of diseases of the blood and blood-forming organs and certain disorders involving the immune mechanism: Secondary | ICD-10-CM | POA: Diagnosis not present

## 2016-03-29 DIAGNOSIS — J3089 Other allergic rhinitis: Secondary | ICD-10-CM

## 2016-03-29 MED ORDER — PHENTERMINE HCL 37.5 MG PO CAPS: 37.5000 mg | ORAL_CAPSULE | ORAL | Status: DC

## 2016-03-29 MED ORDER — POLYETHYLENE GLYCOL 3350 17 GM/SCOOP PO POWD: 17.0000 g | Freq: Two times a day (BID) | ORAL | Status: DC | PRN

## 2016-03-29 MED ORDER — LINACLOTIDE 145 MCG PO CAPS: 145.0000 ug | ORAL_CAPSULE | Freq: Every day | ORAL | Status: DC

## 2016-03-29 NOTE — Progress Notes (Signed)
Name: Lori Beltran   MRN: [id number]    DOB: 1976-01-23   Date:03/29/2016       Progress Note  Subjective  Chief Complaint  Chief Complaint  Patient presents with  . Obesity    Wants to talk about weight loss medication  . Migraine    Gets them monthly  . Gastroesophageal Reflux    Well controlled  . Urticaria    Going to allergist for shots weekly    HPI  Migraine Headaches: episodes are on average once a month. She takes Excedrin migraine prn, she has Imitrex but does not take it very often because she does not like the side effect.  She states when she has an episode they are described as starting on nuchal area or the top of her head, described as sharp, associated with nausea, and occasional vomiting. She has phonophobia and photophobia. She denies aura.  Environmental allergies and one episode of angioedema: seeing Dr. Donneta Romberg she is having allergy shots weekly since January 2016. Feeling better. No recent angioedema, she also denies any hives. She also states seasonal allergies also under control, nasal congestion is minimal.  Obesity: she was given a prescription for Adipex in July 2016 and again in September. It works well for her, she could not tolerate Contrave and Qsymia is too expensive. She tried Belviq but it did not work for her. She has noticed  dry mouth in the past and increase in constipation, but improved with increase water intake. She has lost 11 lbs since last visit in September 2016. She had one visit with a dietician and has decrease carbohydrate intake, however frustrated because she is no longer losing weight  Chronic Constipation: she takes Linzess every other day and MIralax occasionally. Bowel movements daily with this regiment. Side effects is diarrhea, but no soiling or accidents.  GERD: under control, she has intermittent heartburn, and takes medication prn. Symptoms less than once week. Advised to stop Omeprazole and only take Ranitidine prn  Patient  Active Problem List   Diagnosis Date Noted  . 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 Chronic constipation 07/15/2015  . History of iron deficiency anemia 07/15/2015  . Migraine without aura and without status migrainosus, not intractable 05/04/2015  . Environmental and seasonal allergies 05/04/2015  . Obesity (BMI 30.0-34.9) 05/04/2015  . Plantar fasciitis of left foot 05/04/2015  . Vitamin D deficiency 05/04/2015  . Snoring 05/04/2015  . GERD (gastroesophageal reflux disease) 05/04/2015    Past Surgical History  Procedure Laterality Date  . Tubal ligation    . Endometrial ablation      Family History  Problem Relation Age of Onset  . Sleep apnea Mother   . Sleep apnea Father   . Allergic rhinitis Daughter   . Allergic rhinitis Son   . Breast cancer Paternal Grandmother     Social History   Social History  . Marital Status: Married    Spouse Name: N/A  . Number of Children: N/A  . Years of Education: N/A   Occupational History  . Not on file.   Social History Main Topics  . Smoking status: Never Smoker   . Smokeless tobacco: Never Used  . Alcohol Use: No  . Drug Use: No  . Sexual Activity:    Partners: Male   Other Topics Concern  . Not on file   Social History Narrative     Current outpatient prescriptions:  .  EPINEPHrine (EPIPEN 2-PAK) 0.3 mg/0.3 mL IJ SOAJ injection,  EPIPEN 2-PAK, 0.3MG /0.3ML (Injection Solution Auto-injector)  1 (one) Soln Auto-inj Soln Auto-inj prn anaphylaxis for 30 days  Quantity: 2;  Refills: 0   Ordered :11-Oct-2014  Cathrine Muster ;  Started 21-Sep-2014 Active, Disp: , Rfl:  .  fluticasone (FLONASE) 50 MCG/ACT nasal spray, Place 2 sprays into both nostrils daily., Disp: 16 g, Rfl: 6 .  hydrOXYzine (ATARAX/VISTARIL) 25 MG tablet, Take by mouth., Disp: , Rfl:  .  linaclotide (LINZESS) 145 MCG CAPS capsule, Take 1 capsule (145 mcg total) by mouth daily., Disp: 30 capsule, Rfl: 2 .  loratadine (CLARITIN) 10 MG tablet, Take by  mouth., Disp: , Rfl:  .  Multiple Vitamins-Minerals (MULTIVITAL) tablet, Take by mouth., Disp: , Rfl:  .  phentermine 37.5 MG capsule, Take 1 capsule (37.5 mg total) by mouth every morning., Disp: 30 capsule, Rfl: 0 .  polyethylene glycol powder (GLYCOLAX/MIRALAX) powder, Take 17 g by mouth 2 (two) times daily as needed., Disp: 3350 g, Rfl: 2 .  ranitidine (ZANTAC) 150 MG tablet, Take by mouth., Disp: , Rfl:  .  SUMAtriptan (IMITREX) 100 MG tablet, Take by mouth., Disp: , Rfl:  .  valACYclovir (VALTREX) 500 MG tablet, , Disp: , Rfl: 12  No Known Allergies   ROS  Constitutional: Negative for fever, positive for  weight change - loss.  Respiratory: Negative for cough and shortness of breath.   Cardiovascular: Negative for chest pain or palpitations.  Gastrointestinal: Negative for abdominal pain, no bowel changes.  Musculoskeletal: Negative for gait problem or joint swelling.  Skin: Negative for rash.  Neurological: Negative for dizziness, positive for intermittent headache No other specific complaints in a complete review of systems (except as listed in HPI above).  Objective  Filed Vitals:   03/29/16 1119  BP: 114/66  Pulse: 92  Temp: 97.5 F (36.4 C)  TempSrc: Oral  Resp: 16  Height: 5\' 1"  (1.549 m)  Weight: 163 lb 12.8 oz (74.299 kg)  SpO2: 98%    Body mass index is 30.97 kg/(m^2).  Physical Exam  Constitutional: Patient appears well-developed and well-nourished. Obese No distress.  HEENT: head atraumatic, normocephalic, pupils equal and reactive to light,  neck supple, throat within normal limits Cardiovascular: Normal rate, regular rhythm and normal heart sounds.  No murmur heard. No BLE edema. Pulmonary/Chest: Effort normal and breath sounds normal. No respiratory distress. Abdominal: Soft.  There is no tenderness. Psychiatric: Patient has a normal mood and affect. behavior is normal. Judgment and thought content normal.  PHQ2/9: Depression screen [facility] 2/9  07/15/2015 05/04/2015  Decreased Interest 0 0  Down, Depressed, Hopeless 0 0  PHQ - 2 Score 0 0    Fall Risk: Fall Risk  07/15/2015 05/04/2015  Falls in the past year? No No    Assessment & Plan  1. Migraine without aura and without status migrainosus, not intractable  Continue prn medication   2. Obesity (BMI 30.0-34.9)  Discussed with the patient the risk posed by an increased BMI. Discussed importance of portion control, calorie counting and at least 150 minutes of physical activity weekly. Avoid sweet beverages and drink more water. Eat at least 6 servings of fruit and vegetables daily  Discussed all optiosns for weight loss medications including Belviq, Qsymia, Saxenda and Contrave. Discussed risk and benefits of each of them. She prefers going back on Adipex - phentermine 37.5 MG capsule; Take 1 capsule (37.5 mg total) by mouth every morning.  Dispense: 30 capsule; Refill: 0  3. Gastroesophageal reflux disease without esophagitis  Continue prn medication  4. History of iron deficiency anemia  Doing well   5. Chronic constipation  Discussed lower dose of Linzess and she will think about it - linaclotide (LINZESS) 145 MCG CAPS capsule; Take 1 capsule (145 mcg total) by mouth daily.  Dispense: 30 capsule; Refill: 2 - polyethylene glycol powder (GLYCOLAX/MIRALAX) powder; Take 17 g by mouth 2 (two) times daily as needed.  Dispense: 3350 g; Refill: 2  6. Environmental and seasonal allergies  Continue follow up with Dr. Donneta Romberg   7. Vitamin D deficiency  Continue otc supplements

## 2016-04-03 DIAGNOSIS — J3089 Other allergic rhinitis: Secondary | ICD-10-CM | POA: Diagnosis not present

## 2016-04-03 DIAGNOSIS — J301 Allergic rhinitis due to pollen: Secondary | ICD-10-CM | POA: Diagnosis not present

## 2016-04-03 DIAGNOSIS — J3081 Allergic rhinitis due to animal (cat) (dog) hair and dander: Secondary | ICD-10-CM | POA: Diagnosis not present

## 2016-04-17 DIAGNOSIS — J301 Allergic rhinitis due to pollen: Secondary | ICD-10-CM | POA: Diagnosis not present

## 2016-04-17 DIAGNOSIS — J3081 Allergic rhinitis due to animal (cat) (dog) hair and dander: Secondary | ICD-10-CM | POA: Diagnosis not present

## 2016-04-17 DIAGNOSIS — J3089 Other allergic rhinitis: Secondary | ICD-10-CM | POA: Diagnosis not present

## 2016-04-27 DIAGNOSIS — J3081 Allergic rhinitis due to animal (cat) (dog) hair and dander: Secondary | ICD-10-CM | POA: Diagnosis not present

## 2016-04-27 DIAGNOSIS — J3089 Other allergic rhinitis: Secondary | ICD-10-CM | POA: Diagnosis not present

## 2016-04-27 DIAGNOSIS — J301 Allergic rhinitis due to pollen: Secondary | ICD-10-CM | POA: Diagnosis not present

## 2016-04-30 ENCOUNTER — Ambulatory Visit: Payer: 59 | Admitting: Family Medicine

## 2016-04-30 ENCOUNTER — Ambulatory Visit: Payer: Self-pay | Admitting: Physician Assistant

## 2016-04-30 ENCOUNTER — Ambulatory Visit (INDEPENDENT_AMBULATORY_CARE_PROVIDER_SITE_OTHER): Payer: 59 | Admitting: Family Medicine

## 2016-04-30 ENCOUNTER — Encounter: Payer: Self-pay | Admitting: Family Medicine

## 2016-04-30 VITALS — BP 110/62 | HR 97 | Temp 98.6°F | Resp 18 | Wt 162.0 lb

## 2016-04-30 DIAGNOSIS — K5909 Other constipation: Secondary | ICD-10-CM

## 2016-04-30 DIAGNOSIS — K5900 Constipation, unspecified: Secondary | ICD-10-CM | POA: Diagnosis not present

## 2016-04-30 DIAGNOSIS — R1013 Epigastric pain: Secondary | ICD-10-CM

## 2016-04-30 DIAGNOSIS — R1033 Periumbilical pain: Secondary | ICD-10-CM

## 2016-04-30 DIAGNOSIS — R112 Nausea with vomiting, unspecified: Secondary | ICD-10-CM

## 2016-04-30 MED ORDER — OMEPRAZOLE 40 MG PO CPDR: 40.0000 mg | DELAYED_RELEASE_CAPSULE | Freq: Every day | ORAL | Status: DC

## 2016-04-30 MED ORDER — LINACLOTIDE 72 MCG PO CAPS: 72.0000 ug | ORAL_CAPSULE | Freq: Every day | ORAL | Status: DC

## 2016-04-30 NOTE — Progress Notes (Signed)
Name: Lori Beltran   MRN: [id number]    DOB: 1976-05-25   Date:04/30/2016       Progress Note  Subjective  Chief Complaint  Chief Complaint  Patient presents with  . Abdominal Pain    patient stated for the past month she has been having abdominal pains everytime she eats something. patient stated that she was vomitting today but normally it is just queasiness. patient stated that it has not been any changes with her bowel moverment. patient has generalized abdominal pain that stays in one place. patient stated that after she takes a Linzess she feels a release on the RLQ.    HPI  Abdominal pain: she states that for the past month she has noticed intermittent episodes of periumbilical pain, associated with nausea and vomiting, usually occurs after bowel movements. No fever, no blood in stools. She has chronic constipations and has Bristol scale of 7 after taking Linzess in am's but if she skips medications stools are Bristol scale of 1. She states sometimes the pain seems to localize on RLQ. She still has her gallbladder. She states it does not matter what kind of food she eats, at times even water can bother her. Vomit usually contains food fragments. Occasionally has regurgitation .   Patient Active Problem List   Diagnosis Date Noted  . 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 Chronic constipation 07/15/2015  . History of iron deficiency anemia 07/15/2015  . Migraine without aura and without status migrainosus, not intractable 05/04/2015  . Environmental and seasonal allergies 05/04/2015  . Obesity (BMI 30.0-34.9) 05/04/2015  . Plantar fasciitis of left foot 05/04/2015  . Vitamin D deficiency 05/04/2015  . Snoring 05/04/2015  . GERD (gastroesophageal reflux disease) 05/04/2015    Past Surgical History  Procedure Laterality Date  . Tubal ligation    . Endometrial ablation      Family History  Problem Relation Age of Onset  . Sleep apnea Mother   . Sleep apnea Father    . Allergic rhinitis Daughter   . Allergic rhinitis Son   . Breast cancer Paternal Grandmother     Social History   Social History  . Marital Status: Married    Spouse Name: N/A  . Number of Children: N/A  . Years of Education: N/A   Occupational History  . Not on file.   Social History Main Topics  . Smoking status: Never Smoker   . Smokeless tobacco: Never Used  . Alcohol Use: No  . Drug Use: No  . Sexual Activity:    Partners: Male   Other Topics Concern  . Not on file   Social History Narrative     Current outpatient prescriptions:  .  fluticasone (FLONASE) 50 MCG/ACT nasal spray, Place 2 sprays into both nostrils daily., Disp: 16 g, Rfl: 6 .  hydrOXYzine (ATARAX/VISTARIL) 25 MG tablet, Take by mouth., Disp: , Rfl:  .  linaclotide (LINZESS) 72 MCG capsule, Take 1 capsule (72 mcg total) by mouth daily., Disp: 30 capsule, Rfl: 2 .  loratadine (CLARITIN) 10 MG tablet, Take by mouth., Disp: , Rfl:  .  Multiple Vitamins-Minerals (MULTIVITAL) tablet, Take by mouth., Disp: , Rfl:  .  polyethylene glycol powder (GLYCOLAX/MIRALAX) powder, Take 17 g by mouth 2 (two) times daily as needed., Disp: 3350 g, Rfl: 2 .  ranitidine (ZANTAC) 150 MG tablet, Take by mouth., Disp: , Rfl:  .  EPINEPHrine (EPIPEN 2-PAK) 0.3 mg/0.3 mL IJ SOAJ injection, EPIPEN 2-PAK, 0.3MG /0.3ML (Injection Solution  Auto-injector)  1 (one) Soln Auto-inj Soln Auto-inj prn anaphylaxis for 30 days  Quantity: 2;  Refills: 0   Ordered :11-Oct-2014  Cathrine Muster ;  Started 21-Sep-2014 Active, Disp: , Rfl:  .  phentermine 37.5 MG capsule, Take 1 capsule (37.5 mg total) by mouth every morning. (Patient not taking: Reported on 04/30/2016), Disp: 30 capsule, Rfl: 0 .  valACYclovir (VALTREX) 500 MG tablet, Reported on 04/30/2016, Disp: , Rfl: 12  No Known Allergies   ROS  Ten systems reviewed and is negative except as mentioned in HPI   Objective  Filed Vitals:   04/30/16 1326  BP: 110/62  Pulse: 97  Temp: 98.6  F (37 C)  TempSrc: Oral  Resp: 18  Weight: 162 lb (73.483 kg)  SpO2: 97%    Body mass index is 30.63 kg/(m^2).  Physical Exam  Constitutional: Patient appears well-developed and well-nourished. Obese  No distress.  HEENT: head atraumatic, normocephalic, pupils equal and reactive to light,  neck supple, throat within normal limits Cardiovascular: Normal rate, regular rhythm and normal heart sounds.  No murmur heard. No BLE edema. Pulmonary/Chest: Effort normal and breath sounds normal. No respiratory distress. Abdominal: Soft.  There pain during palpation of epigastric and RUQ , normal bowel sounds.  Psychiatric: Patient has a normal mood and affect. behavior is normal. Judgment and thought content normal.  PHQ2/9: Depression screen [facility] 2/9 04/30/2016 07/15/2015 05/04/2015  Decreased Interest 0 0 0  Down, Depressed, Hopeless 0 0 0  PHQ - 2 Score 0 0 0     Fall Risk: Fall Risk  04/30/2016 07/15/2015 05/04/2015  Falls in the past year? No No No     Functional Status Survey: Is the patient deaf or have difficulty hearing?: No Does the patient have difficulty seeing, even when wearing glasses/contacts?: No Does the patient have difficulty concentrating, remembering, or making decisions?: No Does the patient have difficulty walking or climbing stairs?: No Does the patient have difficulty dressing or bathing?: No Does the patient have difficulty doing errands alone such as visiting a doctor's office or shopping?: No    Assessment & Plan  1. Periumbilical abdominal pain  Discussed getting labs first , consider gallbladder US if negative h. Pylori test and no improvement of symptoms.  - CBC with Differential/Platelet - Comprehensive metabolic panel - Lipase  2. Nausea and vomiting, vomiting of unspecified type  - CBC with Differential/Platelet - Comprehensive metabolic panel - Lipase  3. Chronic constipation  - linaclotide (LINZESS) 72 MCG capsule; Take 1 capsule (72 mcg  total) by mouth daily.  Dispense: 30 capsule; Refill: 2 Decrease dose, discussed KUB and soiling - but she would like to hold off for now  4. Dyspepsia  - H. pylori antigen, stool - omeprazole (PRILOSEC) 40 MG capsule; Take 1 capsule (40 mg total) by mouth daily.  Dispense: 30 capsule; Refill: 3

## 2016-04-30 NOTE — Addendum Note (Signed)
Addended by: Inda Coke on: 04/30/2016 01:53 PM   Modules accepted: Orders

## 2016-05-01 ENCOUNTER — Ambulatory Visit: Payer: Self-pay | Admitting: Physician Assistant

## 2016-05-01 LAB — COMPREHENSIVE METABOLIC PANEL
ALT: 10 U/L (ref 6–29)
AST: 12 U/L (ref 10–30)
Albumin: 3.9 g/dL (ref 3.6–5.1)
Alkaline Phosphatase: 71 U/L (ref 33–115)
BUN: 7 mg/dL (ref 7–25)
CHLORIDE: 106 mmol/L (ref 98–110)
CO2: 26 mmol/L (ref 20–31)
Calcium: 8.9 mg/dL (ref 8.6–10.2)
Creat: 0.72 mg/dL (ref 0.50–1.10)
GLUCOSE: 91 mg/dL (ref 65–99)
POTASSIUM: 4.1 mmol/L (ref 3.5–5.3)
Sodium: 140 mmol/L (ref 135–146)
Total Bilirubin: 0.5 mg/dL (ref 0.2–1.2)
Total Protein: 6.4 g/dL (ref 6.1–8.1)

## 2016-05-01 LAB — LIPASE: Lipase: 8 U/L (ref 7–60)

## 2016-05-01 LAB — CBC WITH DIFFERENTIAL/PLATELET
BASOS PCT: 1 %
Basophils Absolute: 65 cells/uL (ref 0–200)
EOS ABS: 195 {cells}/uL (ref 15–500)
Eosinophils Relative: 3 %
HCT: 37.4 % (ref 35.0–45.0)
Hemoglobin: 12.1 g/dL (ref 11.7–15.5)
LYMPHS PCT: 41 %
Lymphs Abs: 2665 cells/uL (ref 850–3900)
MCH: 27.3 pg (ref 27.0–33.0)
MCHC: 32.4 g/dL (ref 32.0–36.0)
MCV: 84.2 fL (ref 80.0–100.0)
MONOS PCT: 10 %
MPV: 9.6 fL (ref 7.5–12.5)
Monocytes Absolute: 650 cells/uL (ref 200–950)
Neutro Abs: 2925 cells/uL (ref 1500–7800)
Neutrophils Relative %: 45 %
PLATELETS: 414 10*3/uL — AB (ref 140–400)
RBC: 4.44 MIL/uL (ref 3.80–5.10)
RDW: 14.8 % (ref 11.0–15.0)
WBC: 6.5 10*3/uL (ref 3.8–10.8)

## 2016-05-01 LAB — H. PYLORI BREATH TEST: H. pylori Breath Test: NOT DETECTED

## 2016-05-04 DIAGNOSIS — J301 Allergic rhinitis due to pollen: Secondary | ICD-10-CM | POA: Diagnosis not present

## 2016-05-04 DIAGNOSIS — J3081 Allergic rhinitis due to animal (cat) (dog) hair and dander: Secondary | ICD-10-CM | POA: Diagnosis not present

## 2016-05-04 DIAGNOSIS — J3089 Other allergic rhinitis: Secondary | ICD-10-CM | POA: Diagnosis not present

## 2016-05-18 DIAGNOSIS — J301 Allergic rhinitis due to pollen: Secondary | ICD-10-CM | POA: Diagnosis not present

## 2016-05-18 DIAGNOSIS — J3081 Allergic rhinitis due to animal (cat) (dog) hair and dander: Secondary | ICD-10-CM | POA: Diagnosis not present

## 2016-05-18 DIAGNOSIS — J3089 Other allergic rhinitis: Secondary | ICD-10-CM | POA: Diagnosis not present

## 2016-05-25 DIAGNOSIS — J301 Allergic rhinitis due to pollen: Secondary | ICD-10-CM | POA: Diagnosis not present

## 2016-05-25 DIAGNOSIS — J3089 Other allergic rhinitis: Secondary | ICD-10-CM | POA: Diagnosis not present

## 2016-05-25 DIAGNOSIS — J3081 Allergic rhinitis due to animal (cat) (dog) hair and dander: Secondary | ICD-10-CM | POA: Diagnosis not present

## 2016-06-01 DIAGNOSIS — J301 Allergic rhinitis due to pollen: Secondary | ICD-10-CM | POA: Diagnosis not present

## 2016-06-01 DIAGNOSIS — J3089 Other allergic rhinitis: Secondary | ICD-10-CM | POA: Diagnosis not present

## 2016-06-01 DIAGNOSIS — J3081 Allergic rhinitis due to animal (cat) (dog) hair and dander: Secondary | ICD-10-CM | POA: Diagnosis not present

## 2016-06-08 DIAGNOSIS — J301 Allergic rhinitis due to pollen: Secondary | ICD-10-CM | POA: Diagnosis not present

## 2016-06-08 DIAGNOSIS — J3081 Allergic rhinitis due to animal (cat) (dog) hair and dander: Secondary | ICD-10-CM | POA: Diagnosis not present

## 2016-06-08 DIAGNOSIS — J3089 Other allergic rhinitis: Secondary | ICD-10-CM | POA: Diagnosis not present

## 2016-06-15 DIAGNOSIS — J301 Allergic rhinitis due to pollen: Secondary | ICD-10-CM | POA: Diagnosis not present

## 2016-06-15 DIAGNOSIS — J3089 Other allergic rhinitis: Secondary | ICD-10-CM | POA: Diagnosis not present

## 2016-06-15 DIAGNOSIS — J3081 Allergic rhinitis due to animal (cat) (dog) hair and dander: Secondary | ICD-10-CM | POA: Diagnosis not present

## 2016-06-22 DIAGNOSIS — J3089 Other allergic rhinitis: Secondary | ICD-10-CM | POA: Diagnosis not present

## 2016-06-22 DIAGNOSIS — J3081 Allergic rhinitis due to animal (cat) (dog) hair and dander: Secondary | ICD-10-CM | POA: Diagnosis not present

## 2016-06-22 DIAGNOSIS — J301 Allergic rhinitis due to pollen: Secondary | ICD-10-CM | POA: Diagnosis not present

## 2016-06-29 DIAGNOSIS — J3081 Allergic rhinitis due to animal (cat) (dog) hair and dander: Secondary | ICD-10-CM | POA: Diagnosis not present

## 2016-06-29 DIAGNOSIS — J301 Allergic rhinitis due to pollen: Secondary | ICD-10-CM | POA: Diagnosis not present

## 2016-06-29 DIAGNOSIS — J3089 Other allergic rhinitis: Secondary | ICD-10-CM | POA: Diagnosis not present

## 2016-07-06 DIAGNOSIS — J301 Allergic rhinitis due to pollen: Secondary | ICD-10-CM | POA: Diagnosis not present

## 2016-07-06 DIAGNOSIS — J3089 Other allergic rhinitis: Secondary | ICD-10-CM | POA: Diagnosis not present

## 2016-07-06 DIAGNOSIS — J3081 Allergic rhinitis due to animal (cat) (dog) hair and dander: Secondary | ICD-10-CM | POA: Diagnosis not present

## 2016-07-19 DIAGNOSIS — J301 Allergic rhinitis due to pollen: Secondary | ICD-10-CM | POA: Diagnosis not present

## 2016-07-19 DIAGNOSIS — J3089 Other allergic rhinitis: Secondary | ICD-10-CM | POA: Diagnosis not present

## 2016-08-03 DIAGNOSIS — J301 Allergic rhinitis due to pollen: Secondary | ICD-10-CM | POA: Diagnosis not present

## 2016-08-03 DIAGNOSIS — J3089 Other allergic rhinitis: Secondary | ICD-10-CM | POA: Diagnosis not present

## 2016-08-03 DIAGNOSIS — J3081 Allergic rhinitis due to animal (cat) (dog) hair and dander: Secondary | ICD-10-CM | POA: Diagnosis not present

## 2016-08-09 DIAGNOSIS — J301 Allergic rhinitis due to pollen: Secondary | ICD-10-CM | POA: Diagnosis not present

## 2016-08-09 DIAGNOSIS — J3081 Allergic rhinitis due to animal (cat) (dog) hair and dander: Secondary | ICD-10-CM | POA: Diagnosis not present

## 2016-08-09 DIAGNOSIS — J3089 Other allergic rhinitis: Secondary | ICD-10-CM | POA: Diagnosis not present

## 2016-08-10 DIAGNOSIS — J3081 Allergic rhinitis due to animal (cat) (dog) hair and dander: Secondary | ICD-10-CM | POA: Diagnosis not present

## 2016-08-10 DIAGNOSIS — J3089 Other allergic rhinitis: Secondary | ICD-10-CM | POA: Diagnosis not present

## 2016-08-10 DIAGNOSIS — J301 Allergic rhinitis due to pollen: Secondary | ICD-10-CM | POA: Diagnosis not present

## 2016-08-16 DIAGNOSIS — H1045 Other chronic allergic conjunctivitis: Secondary | ICD-10-CM | POA: Diagnosis not present

## 2016-08-16 DIAGNOSIS — J301 Allergic rhinitis due to pollen: Secondary | ICD-10-CM | POA: Diagnosis not present

## 2016-08-16 DIAGNOSIS — L509 Urticaria, unspecified: Secondary | ICD-10-CM | POA: Diagnosis not present

## 2016-08-16 DIAGNOSIS — J3081 Allergic rhinitis due to animal (cat) (dog) hair and dander: Secondary | ICD-10-CM | POA: Diagnosis not present

## 2016-08-16 DIAGNOSIS — J3089 Other allergic rhinitis: Secondary | ICD-10-CM | POA: Diagnosis not present

## 2016-08-24 DIAGNOSIS — J3089 Other allergic rhinitis: Secondary | ICD-10-CM | POA: Diagnosis not present

## 2016-08-24 DIAGNOSIS — J301 Allergic rhinitis due to pollen: Secondary | ICD-10-CM | POA: Diagnosis not present

## 2016-08-24 DIAGNOSIS — J3081 Allergic rhinitis due to animal (cat) (dog) hair and dander: Secondary | ICD-10-CM | POA: Diagnosis not present

## 2016-08-29 ENCOUNTER — Telehealth: Payer: Self-pay

## 2016-09-05 DIAGNOSIS — H5213 Myopia, bilateral: Secondary | ICD-10-CM | POA: Diagnosis not present

## 2016-09-05 DIAGNOSIS — H43313 Vitreous membranes and strands, bilateral: Secondary | ICD-10-CM | POA: Diagnosis not present

## 2016-09-05 DIAGNOSIS — H3589 Other specified retinal disorders: Secondary | ICD-10-CM | POA: Diagnosis not present

## 2016-09-06 DIAGNOSIS — J301 Allergic rhinitis due to pollen: Secondary | ICD-10-CM | POA: Diagnosis not present

## 2016-09-06 DIAGNOSIS — J3089 Other allergic rhinitis: Secondary | ICD-10-CM | POA: Diagnosis not present

## 2016-09-06 DIAGNOSIS — J3081 Allergic rhinitis due to animal (cat) (dog) hair and dander: Secondary | ICD-10-CM | POA: Diagnosis not present

## 2016-09-19 ENCOUNTER — Other Ambulatory Visit: Payer: Self-pay | Admitting: Family Medicine

## 2016-09-19 DIAGNOSIS — E559 Vitamin D deficiency, unspecified: Secondary | ICD-10-CM | POA: Diagnosis not present

## 2016-09-19 DIAGNOSIS — E538 Deficiency of other specified B group vitamins: Secondary | ICD-10-CM | POA: Diagnosis not present

## 2016-09-19 DIAGNOSIS — R51 Headache: Secondary | ICD-10-CM | POA: Diagnosis not present

## 2016-09-19 DIAGNOSIS — H35361 Drusen (degenerative) of macula, right eye: Secondary | ICD-10-CM | POA: Insufficient documentation

## 2016-09-19 DIAGNOSIS — G43019 Migraine without aura, intractable, without status migrainosus: Secondary | ICD-10-CM | POA: Diagnosis not present

## 2016-09-19 DIAGNOSIS — G43009 Migraine without aura, not intractable, without status migrainosus: Secondary | ICD-10-CM

## 2016-09-20 ENCOUNTER — Encounter (INDEPENDENT_AMBULATORY_CARE_PROVIDER_SITE_OTHER): Payer: 59 | Admitting: Ophthalmology

## 2016-09-20 DIAGNOSIS — H3530 Unspecified macular degeneration: Secondary | ICD-10-CM | POA: Diagnosis not present

## 2016-09-20 DIAGNOSIS — H43813 Vitreous degeneration, bilateral: Secondary | ICD-10-CM

## 2016-09-20 DIAGNOSIS — H2513 Age-related nuclear cataract, bilateral: Secondary | ICD-10-CM | POA: Diagnosis not present

## 2016-09-21 ENCOUNTER — Other Ambulatory Visit: Payer: Self-pay | Admitting: Neurology

## 2016-09-21 DIAGNOSIS — G43019 Migraine without aura, intractable, without status migrainosus: Secondary | ICD-10-CM

## 2016-10-02 NOTE — Telephone Encounter (Signed)
Erroneous Encounter

## 2016-10-03 ENCOUNTER — Ambulatory Visit: Payer: 59

## 2016-10-08 DIAGNOSIS — E559 Vitamin D deficiency, unspecified: Secondary | ICD-10-CM | POA: Diagnosis not present

## 2016-10-08 DIAGNOSIS — Z0000 Encounter for general adult medical examination without abnormal findings: Secondary | ICD-10-CM | POA: Diagnosis not present

## 2016-10-08 DIAGNOSIS — Z01419 Encounter for gynecological examination (general) (routine) without abnormal findings: Secondary | ICD-10-CM | POA: Diagnosis not present

## 2016-10-08 DIAGNOSIS — Z13220 Encounter for screening for lipoid disorders: Secondary | ICD-10-CM | POA: Diagnosis not present

## 2016-10-08 DIAGNOSIS — Z113 Encounter for screening for infections with a predominantly sexual mode of transmission: Secondary | ICD-10-CM | POA: Diagnosis not present

## 2016-10-08 DIAGNOSIS — N920 Excessive and frequent menstruation with regular cycle: Secondary | ICD-10-CM | POA: Diagnosis not present

## 2016-10-08 DIAGNOSIS — Z1329 Encounter for screening for other suspected endocrine disorder: Secondary | ICD-10-CM | POA: Diagnosis not present

## 2016-10-08 DIAGNOSIS — Z1239 Encounter for other screening for malignant neoplasm of breast: Secondary | ICD-10-CM | POA: Diagnosis not present

## 2016-10-11 ENCOUNTER — Other Ambulatory Visit
Admission: RE | Admit: 2016-10-11 | Discharge: 2016-10-11 | Disposition: A | Discharge status: Home / Self Care | Payer: 59 | Source: Ambulatory Visit | Attending: Obstetrics and Gynecology | Admitting: Obstetrics and Gynecology

## 2016-10-11 DIAGNOSIS — E079 Disorder of thyroid, unspecified: Secondary | ICD-10-CM | POA: Diagnosis not present

## 2016-10-11 DIAGNOSIS — E7800 Pure hypercholesterolemia, unspecified: Secondary | ICD-10-CM | POA: Diagnosis not present

## 2016-10-11 LAB — CBC
HEMATOCRIT: 36.1 % (ref 35.0–47.0)
HEMOGLOBIN: 12.3 g/dL (ref 12.0–16.0)
MCH: 29.1 pg (ref 26.0–34.0)
MCHC: 34.1 g/dL (ref 32.0–36.0)
MCV: 85.2 fL (ref 80.0–100.0)
Platelets: 388 10*3/uL (ref 150–440)
RBC: 4.24 MIL/uL (ref 3.80–5.20)
RDW: 14.7 % — AB (ref 11.5–14.5)
WBC: 6.6 10*3/uL (ref 3.6–11.0)

## 2016-10-11 LAB — LIPID PANEL
CHOL/HDL RATIO: 3.3 ratio
Cholesterol: 148 mg/dL (ref 0–200)
HDL: 45 mg/dL (ref >40)
LDL Cholesterol: 72 mg/dL (ref 0–99)
TRIGLYCERIDES: 156 mg/dL — AB (ref <150)
VLDL: 31 mg/dL (ref 0–40)

## 2016-10-11 LAB — TSH: TSH: 2.161 u[IU]/mL (ref 0.350–4.500)

## 2016-10-12 LAB — HIV ANTIBODY (ROUTINE TESTING W REFLEX): HIV Screen 4th Generation wRfx: NONREACTIVE

## 2016-10-12 LAB — RPR: RPR Ser Ql: NONREACTIVE

## 2016-10-12 LAB — HEPATITIS C ANTIBODY: HCV AB: 0.2 {s_co_ratio} (ref 0.0–0.9)

## 2016-10-29 ENCOUNTER — Other Ambulatory Visit: Payer: Self-pay | Admitting: Family Medicine

## 2016-10-29 DIAGNOSIS — Z87898 Personal history of other specified conditions: Secondary | ICD-10-CM

## 2016-10-29 MED ORDER — SCOPOLAMINE 1 MG/3DAYS TD PT72: 1.0000 | MEDICATED_PATCH | TRANSDERMAL | 12 refills | Status: DC

## 2016-12-06 ENCOUNTER — Other Ambulatory Visit: Payer: Self-pay | Admitting: Family Medicine

## 2016-12-06 DIAGNOSIS — Z1231 Encounter for screening mammogram for malignant neoplasm of breast: Secondary | ICD-10-CM

## 2016-12-11 DIAGNOSIS — J301 Allergic rhinitis due to pollen: Secondary | ICD-10-CM | POA: Diagnosis not present

## 2016-12-11 DIAGNOSIS — J3089 Other allergic rhinitis: Secondary | ICD-10-CM | POA: Diagnosis not present

## 2016-12-11 DIAGNOSIS — J3081 Allergic rhinitis due to animal (cat) (dog) hair and dander: Secondary | ICD-10-CM | POA: Diagnosis not present

## 2016-12-12 ENCOUNTER — Ambulatory Visit: Payer: Self-pay

## 2016-12-13 ENCOUNTER — Encounter: Payer: Self-pay | Admitting: Family Medicine

## 2016-12-13 ENCOUNTER — Other Ambulatory Visit: Payer: Self-pay | Admitting: Family Medicine

## 2016-12-13 MED ORDER — RANITIDINE HCL 150 MG PO TABS: 150.0000 mg | ORAL_TABLET | Freq: Two times a day (BID) | ORAL | 5 refills | Status: DC

## 2016-12-13 MED ORDER — FLUTICASONE PROPIONATE 50 MCG/ACT NA SUSP: 2.0000 | Freq: Every day | NASAL | 6 refills | Status: DC

## 2016-12-18 ENCOUNTER — Ambulatory Visit
Admission: RE | Admit: 2016-12-18 | Discharge: 2016-12-18 | Disposition: A | Discharge status: Home / Self Care | Payer: 59 | Source: Ambulatory Visit | Attending: Family Medicine | Admitting: Family Medicine

## 2016-12-18 DIAGNOSIS — Z1231 Encounter for screening mammogram for malignant neoplasm of breast: Secondary | ICD-10-CM | POA: Insufficient documentation

## 2017-01-11 ENCOUNTER — Other Ambulatory Visit: Payer: Self-pay | Admitting: Emergency Medicine

## 2017-01-11 MED ORDER — PREDNISONE 10 MG PO TABS: 10.0000 mg | ORAL_TABLET | Freq: Two times a day (BID) | ORAL | 0 refills | Status: DC

## 2017-01-11 MED ORDER — BENZONATATE 100 MG PO CAPS: 100.0000 mg | ORAL_CAPSULE | Freq: Four times a day (QID) | ORAL | 0 refills | Status: DC

## 2017-08-07 ENCOUNTER — Encounter: Payer: Self-pay | Admitting: Family Medicine

## 2017-08-07 ENCOUNTER — Other Ambulatory Visit: Payer: Self-pay | Admitting: Family Medicine

## 2017-08-07 ENCOUNTER — Ambulatory Visit (INDEPENDENT_AMBULATORY_CARE_PROVIDER_SITE_OTHER): Payer: 59 | Admitting: Family Medicine

## 2017-08-07 VITALS — BP 110/80 | HR 80 | Resp 14 | Ht 61.0 in | Wt 175.1 lb

## 2017-08-07 DIAGNOSIS — G43009 Migraine without aura, not intractable, without status migrainosus: Secondary | ICD-10-CM

## 2017-08-07 DIAGNOSIS — L819 Disorder of pigmentation, unspecified: Secondary | ICD-10-CM

## 2017-08-07 DIAGNOSIS — L83 Acanthosis nigricans: Secondary | ICD-10-CM

## 2017-08-07 DIAGNOSIS — B079 Viral wart, unspecified: Secondary | ICD-10-CM | POA: Diagnosis not present

## 2017-08-07 DIAGNOSIS — Z79899 Other long term (current) drug therapy: Secondary | ICD-10-CM

## 2017-08-07 DIAGNOSIS — E669 Obesity, unspecified: Secondary | ICD-10-CM

## 2017-08-07 DIAGNOSIS — K219 Gastro-esophageal reflux disease without esophagitis: Secondary | ICD-10-CM

## 2017-08-07 DIAGNOSIS — Z13220 Encounter for screening for lipoid disorders: Secondary | ICD-10-CM | POA: Diagnosis not present

## 2017-08-07 DIAGNOSIS — E66811 Obesity, class 1: Secondary | ICD-10-CM

## 2017-08-07 NOTE — Progress Notes (Signed)
Name: Lori Beltran   MRN: [id number]    DOB: Jan 01, 1976   Date:08/07/2017       Progress Note  Subjective  Chief Complaint  Chief Complaint  Patient presents with  . Nevus    HPI  Skin lesion: she states that she has been noticing increase in skin tags but there is one that is changing in color ( white ) and growing and she is concerned about cancer. It does not bleed but it is bothering her  Migraines: resolved since right ear lobe piercing Feb 2018, migraines used to be severe and had episodes about 2-3 times monthly plus daily headaches. Only two migraines since Feb, doing great  GERD: doing better, taking Zantac of Omeprazole prn only, at most once a week. Advised to continue H2 blocker prn but try to avoid PPI because of long term risk  Obesity: going to the gym 3 days a week, however gained weight. Discussed logging calorie intake.   Chronic constipation: she states it is better, she takes Linzess prn only, about once every 2 weeks, she drinks senna leaves tea and has been having bowel movements daily. She still has to strain.    Patient Active Problem List   Diagnosis Date Noted  . 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 Chronic constipation 07/15/2015  . History of iron deficiency anemia 07/15/2015  . Migraine without aura and without status migrainosus, not intractable 05/04/2015  . Environmental and seasonal allergies 05/04/2015  . Obesity (BMI 30.0-34.9) 05/04/2015  . Plantar fasciitis of left foot 05/04/2015  . Vitamin D deficiency 05/04/2015  . Snoring 05/04/2015  . GERD (gastroesophageal reflux disease) 05/04/2015    Past Surgical History:  Procedure Laterality Date  . ENDOMETRIAL ABLATION    . TUBAL LIGATION      Family History  Problem Relation Age of Onset  . Sleep apnea Mother   . Sleep apnea Father   . Allergic rhinitis Daughter   . Allergic rhinitis Son   . Breast cancer Paternal Grandmother    . Breast cancer Maternal Aunt     Social History   Social History  . Marital status: Married    Spouse name: N/A  . Number of children: N/A  . Years of education: N/A   Occupational History  . Not on file.   Social History Main Topics  . Smoking status: Never Smoker  . Smokeless tobacco: Never Used  . Alcohol use No  . Drug use: No  . Sexual activity: Yes    Partners: Male   Other Topics Concern  . Not on file   Social History Narrative  . No narrative on file     Current Outpatient Prescriptions:  .  EPINEPHrine (EPIPEN 2-PAK) 0.3 mg/0.3 mL IJ SOAJ injection, EPIPEN 2-PAK, 0.3MG /0.3ML (Injection Solution Auto-injector)  1 (one) Soln Auto-inj Soln Auto-inj prn anaphylaxis for 30 days  Quantity: 2;  Refills: 0   Ordered :11-Oct-2014  Cathrine Muster ;  Started 21-Sep-2014 Active, Disp: , Rfl:  .  fluticasone (FLONASE) 50 MCG/ACT nasal spray, Place 2 sprays into both nostrils daily., Disp: 16 g, Rfl: 6 .  linaclotide (LINZESS) 72 MCG capsule, Take 1 capsule (72 mcg total) by mouth daily., Disp: 30 capsule, Rfl: 2 .  loratadine (CLARITIN) 10 MG tablet, Take by mouth., Disp: , Rfl:  .  Multiple Vitamins-Minerals (MULTIVITAL) tablet, Take by mouth., Disp: , Rfl:  .  omeprazole (PRILOSEC) 40 MG capsule, Take 1  capsule (40 mg total) by mouth daily., Disp: 30 capsule, Rfl: 3 .  ranitidine (ZANTAC) 150 MG tablet, Take 1 tablet (150 mg total) by mouth 2 (two) times daily., Disp: 60 tablet, Rfl: 5 .  valACYclovir (VALTREX) 500 MG tablet, Reported on 04/30/2016, Disp: , Rfl: 12 .  predniSONE (DELTASONE) 10 MG tablet, Take 1 tablet (10 mg total) by mouth 2 (two) times daily with a meal. (Patient not taking: Reported on 08/07/2017), Disp: 6 tablet, Rfl: 0  No Known Allergies   ROS  Constitutional: Negative for fever, positive for weight change.  Respiratory: Negative for cough and shortness of breath.   Cardiovascular: Negative for chest pain or palpitations.  Gastrointestinal:  Negative for abdominal pain, no bowel changes.  Musculoskeletal: Negative for gait problem or joint swelling.  Skin: Negative for rash.  Neurological: Negative for dizziness or headache.  No other specific complaints in a complete review of systems (except as listed in HPI above).  Objective  Vitals:   08/07/17 0741  BP: 110/80  Pulse: 80  Resp: 14  SpO2: 97%  Weight: 175 lb 1.6 oz (79.4 kg)  Height: 5\' 1"  (1.549 m)    Body mass index is 33.08 kg/m.  Physical Exam  Constitutional: Patient appears well-developed and well-nourished. Obese No distress.  HEENT: head atraumatic, normocephalic, pupils equal and reactive to light,  neck supple, throat within normal limits Cardiovascular: Normal rate, regular rhythm and normal heart sounds.  No murmur heard. No BLE edema. Pulmonary/Chest: Effort normal and breath sounds normal. No respiratory distress. Abdominal: Soft.  There is no tenderness. Skin acanthosis nigricans on her neck, also skin tags on neck ( larger one on the left anterior neck and white in color) Psychiatric: Patient has a normal mood and affect. behavior is normal. Judgment and thought content normal.  PHQ2/9: Depression screen [facility] 2/9 08/07/2017 04/30/2016 07/15/2015 05/04/2015  Decreased Interest 0 0 0 0  Down, Depressed, Hopeless 0 0 0 0  PHQ - 2 Score 0 0 0 0     Fall Risk: Fall Risk  08/07/2017 04/30/2016 07/15/2015 05/04/2015  Falls in the past year? No No No No     Assessment & Plan  1. Change in pigmented skin lesion  - Dermatology pathology ( for one of the skin tags on her neck)   Consent signed: YES  Procedure: Skin Mass Removal Location: neck has multiple skin tags, one looks longer, white and inflamed Equipment used: derma-blade, high temperature cautery, sterile scalpel, tweezers, curved scissors Anesthesia: 1% Lidocaine w/o Epinephrine  Cleaned and prepped: Betadine  After consent signed, are of skin prepped with betadine. Lidocaine w/o  epinephrine injected into skin underneath skin mass. After properly numbed sterile equipment used to remove tag.  Specimen sent for pathology analysis. Instructed on proper care to allow for proper healing. F/U for nursing visit if needed.  2. Acanthosis nigricans  - Hemoglobin A1c - Insulin, random  3. Gastroesophageal reflux disease without esophagitis  Doing well, taking medication prn   4. Migraine without aura and without status migrainosus, not intractable  Doing well since piercing on ear lobe  5. Obesity (BMI 30.0-34.9)  Discussed with the patient the risk posed by an increased BMI. Discussed importance of portion control, calorie counting and at least 150 minutes of physical activity weekly. Avoid sweet beverages and drink more water. Eat at least 6 servings of fruit and vegetables daily   6. Lipid screening  - Lipid panel  7. Long-term use of high-risk medication  -  COMPLETE METABOLIC PANEL WITH GFR

## 2017-08-09 LAB — TISSUE SPECIMEN

## 2017-08-09 LAB — PATHOLOGY

## 2017-08-27 ENCOUNTER — Ambulatory Visit: Payer: 59 | Admitting: Family Medicine

## 2017-08-29 ENCOUNTER — Ambulatory Visit (INDEPENDENT_AMBULATORY_CARE_PROVIDER_SITE_OTHER): Payer: 59 | Admitting: Family Medicine

## 2017-08-29 ENCOUNTER — Encounter: Payer: Self-pay | Admitting: Family Medicine

## 2017-08-29 VITALS — BP 114/76 | HR 81 | Temp 98.3°F | Resp 18 | Ht 61.0 in | Wt 177.0 lb

## 2017-08-29 DIAGNOSIS — E669 Obesity, unspecified: Secondary | ICD-10-CM | POA: Diagnosis not present

## 2017-08-29 DIAGNOSIS — R5383 Other fatigue: Secondary | ICD-10-CM | POA: Diagnosis not present

## 2017-08-29 DIAGNOSIS — F341 Dysthymic disorder: Secondary | ICD-10-CM

## 2017-08-29 DIAGNOSIS — E66811 Obesity, class 1: Secondary | ICD-10-CM

## 2017-08-29 DIAGNOSIS — E559 Vitamin D deficiency, unspecified: Secondary | ICD-10-CM | POA: Diagnosis not present

## 2017-08-29 DIAGNOSIS — K219 Gastro-esophageal reflux disease without esophagitis: Secondary | ICD-10-CM | POA: Diagnosis not present

## 2017-08-29 DIAGNOSIS — Z131 Encounter for screening for diabetes mellitus: Secondary | ICD-10-CM | POA: Diagnosis not present

## 2017-08-29 MED ORDER — OMEPRAZOLE 40 MG PO CPDR: 40.0000 mg | DELAYED_RELEASE_CAPSULE | Freq: Every day | ORAL | 3 refills | Status: DC

## 2017-08-29 NOTE — Progress Notes (Signed)
Name: Lori Beltran   MRN: [id number]    DOB: 11/06/1975   Date:08/29/2017       Progress Note  Subjective  Chief Complaint  Chief Complaint  Patient presents with  . Advice Only    HPI  Dysthymia/fatigue: she has noticed that over the past couple of months she has episodes that she feels sad, without knowing why. She states during episodes she has no energy, feels down and can last a couple of days. At times she wakes up feeling down but improves throughout the day. Her husband works as a Administrator - she has not talked to him about it. She misses when he is gone for months out of time. Her daughter is also a senior in [employer] and they are really close, she is already anticipating her being gone. Her husband also had recently eye surgery and she was really worried about it. She has a history of anemia and vitamin D deficiency and is not taking any supplements. Discussed options. Medication, counseling. She will try counseling through work, and will resume journaling.   Patient Active Problem List   Diagnosis Date Noted  . 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 Chronic constipation 07/15/2015  . History of iron deficiency anemia 07/15/2015  . Migraine without aura and without status migrainosus, not intractable 05/04/2015  . Environmental and seasonal allergies 05/04/2015  . Obesity (BMI 30.0-34.9) 05/04/2015  . Plantar fasciitis of left foot 05/04/2015  . Vitamin D deficiency 05/04/2015  . Snoring 05/04/2015  . GERD (gastroesophageal reflux disease) 05/04/2015    Past Surgical History:  Procedure Laterality Date  . ENDOMETRIAL ABLATION    . TUBAL LIGATION      Family History  Problem Relation Age of Onset  . Sleep apnea Mother   . Sleep apnea Father   . Allergic rhinitis Daughter   . Allergic rhinitis Son   . Breast cancer Paternal Grandmother   . Breast cancer Maternal Aunt     Social History   Socioeconomic History  .  Marital status: Married    Spouse name: Not on file  . Number of children: Not on file  . Years of education: Not on file  . Highest education level: Not on file  Social Needs  . Financial resource strain: Not on file  . Food insecurity - worry: Not on file  . Food insecurity - inability: Not on file  . Transportation needs - medical: Not on file  . Transportation needs - non-medical: Not on file  Occupational History  . Not on file  Tobacco Use  . Smoking status: Never Smoker  . Smokeless tobacco: Never Used  Substance and Sexual Activity  . Alcohol use: No    Alcohol/week: 0.0 oz  . Drug use: No  . Sexual activity: Yes    Partners: Male  Other Topics Concern  . Not on file  Social History Narrative  . Not on file     Current Outpatient Medications:  .  EPINEPHrine (EPIPEN 2-PAK) 0.3 mg/0.3 mL IJ SOAJ injection, EPIPEN 2-PAK, 0.3MG /0.3ML (Injection Solution Auto-injector)  1 (one) Soln Auto-inj Soln Auto-inj prn anaphylaxis for 30 days  Quantity: 2;  Refills: 0   Ordered :11-Oct-2014  Cathrine Muster ;  Started 21-Sep-2014 Active, Disp: , Rfl:  .  fluticasone (FLONASE) 50 MCG/ACT nasal spray, Place 2 sprays into both nostrils daily., Disp: 16 g, Rfl: 6 .  linaclotide (LINZESS) 72 MCG capsule, Take 1  capsule (72 mcg total) by mouth daily., Disp: 30 capsule, Rfl: 2 .  loratadine (CLARITIN) 10 MG tablet, Take by mouth., Disp: , Rfl:  .  Multiple Vitamins-Minerals (MULTIVITAL) tablet, Take by mouth., Disp: , Rfl:  .  omeprazole (PRILOSEC) 40 MG capsule, Take 1 capsule (40 mg total) daily by mouth., Disp: 30 capsule, Rfl: 3 .  ranitidine (ZANTAC) 150 MG tablet, Take 1 tablet (150 mg total) by mouth 2 (two) times daily., Disp: 60 tablet, Rfl: 5 .  valACYclovir (VALTREX) 500 MG tablet, Reported on 04/30/2016, Disp: , Rfl: 12  No Known Allergies   ROS  Constitutional: Negative for fever or significant weight change.  Respiratory: Negative for cough and shortness of breath.    Cardiovascular: Negative for chest pain or palpitations.  Gastrointestinal: Negative for abdominal pain, no bowel changes.  Musculoskeletal: Negative for gait problem or joint swelling.  Skin: Negative for rash.  Neurological: Negative for dizziness or headache.  No other specific complaints in a complete review of systems (except as listed in HPI above).  Objective  Vitals:   08/29/17 0851  BP: 114/76  Pulse: 81  Resp: 18  Temp: 98.3 F (36.8 C)  TempSrc: Oral  SpO2: 97%  Weight: 177 lb (80.3 kg)  Height: 5\' 1"  (1.549 m)    Body mass index is 33.44 kg/m.  Physical Exam  Constitutional: Patient appears well-developed and well-nourished. Obese  No distress.  HEENT: head atraumatic, normocephalic, pupils equal and reactive to light,  neck supple, throat within normal limits Cardiovascular: Normal rate, regular rhythm and normal heart sounds.  No murmur heard. No BLE edema. Pulmonary/Chest: Effort normal and breath sounds normal. No respiratory distress. Abdominal: Soft.  There is no tenderness. Psychiatric: Patient has a normal mood and affect. behavior is normal. Judgment and thought content normal.  Recent Results (from the past 2160 hour(s))  Pathology     Status: None   Collection Time: 08/07/17 10:58 AM  Result Value Ref Range   Relevant History:      Comment: L81.9, L83, history of skin tag, but this one  seems to be changing color     Pathologist:      Comment: Jerald L. Lovena Neighbours, MD, Board Certification in Anatomic/Clinical Pathology Electronically Signed   TISSUE SPECIMEN     Status: None   Collection Time: 08/07/17 10:58 AM  Result Value Ref Range   A Source      Comment: Skin Biopsy, left neck   A Gross Description      Comment: Received in a single container of formalin  labeled with two identifiers, patient name and  DOB, is a shave biopsy measuring 0.6 x 0.5 x 0.3  cm displaying a well-circumscribed, raised, flaky  and light tan lesion measuring  0.3 x 0.2 x 0.2  cm.  The surgical margin is inked red.  The  specimen is bisected and submitted entirely in  cassette A.     A Diagnosis      Comment: Inflamed verruca vulgaris.      PHQ2/9: Depression screen [facility] 2/9 08/29/2017 08/07/2017 04/30/2016 07/15/2015 05/04/2015  Decreased Interest 1 0 0 0 0  Down, Depressed, Hopeless 1 0 0 0 0  PHQ - 2 Score 2 0 0 0 0  Altered sleeping 1 - - - -  Tired, decreased energy 1 - - - -  Change in appetite 1 - - - -  Feeling bad or failure about yourself  1 - - - -  Trouble concentrating  0 - - - -  Moving slowly or fidgety/restless 0 - - - -  Suicidal thoughts 0 - - - -  PHQ-9 Score 6 - - - -  Difficult doing work/chores Somewhat difficult - - - -     Fall Risk: Fall Risk  08/29/2017 08/07/2017 04/30/2016 07/15/2015 05/04/2015  Falls in the past year? No No No No No     Functional Status Survey: Is the patient deaf or have difficulty hearing?: No Does the patient have difficulty seeing, even when wearing glasses/contacts?: No Does the patient have difficulty concentrating, remembering, or making decisions?: No Does the patient have difficulty walking or climbing stairs?: No Does the patient have difficulty dressing or bathing?: No Does the patient have difficulty doing errands alone such as visiting a doctor's office or shopping?: No    Assessment & Plan  1. Gastroesophageal reflux disease without esophagitis  - omeprazole (PRILOSEC) 40 MG capsule; Take 1 capsule (40 mg total) daily by mouth.  Dispense: 30 capsule; Refill: 3  2. Dysthymia  - CBC with Differential/Platelet - Vitamin B12 - VITAMIN D 25 Hydroxy (Vit-D Deficiency, Fractures)  3. Vitamin D deficiency  - VITAMIN D 25 Hydroxy (Vit-D Deficiency, Fractures)  4. Other fatigue  - CBC with Differential/Platelet - Vitamin B12 - VITAMIN D 25 Hydroxy (Vit-D Deficiency, Fractures) - COMPLETE METABOLIC PANEL WITH GFR - TSH  5. Obesity (BMI 30.0-34.9)  - TSH  6.  Diabetes mellitus screening  - Hemoglobin A1c

## 2017-10-04 DIAGNOSIS — H5213 Myopia, bilateral: Secondary | ICD-10-CM | POA: Diagnosis not present

## 2017-10-17 ENCOUNTER — Telehealth: Payer: Self-pay | Admitting: Obstetrics and Gynecology

## 2017-10-17 ENCOUNTER — Other Ambulatory Visit
Admission: RE | Admit: 2017-10-17 | Discharge: 2017-10-17 | Disposition: A | Discharge status: Home / Self Care | Payer: 59 | Source: Ambulatory Visit | Attending: Obstetrics and Gynecology | Admitting: Obstetrics and Gynecology

## 2017-10-17 ENCOUNTER — Ambulatory Visit (INDEPENDENT_AMBULATORY_CARE_PROVIDER_SITE_OTHER): Payer: 59 | Admitting: Obstetrics and Gynecology

## 2017-10-17 ENCOUNTER — Encounter: Payer: Self-pay | Admitting: Obstetrics and Gynecology

## 2017-10-17 VITALS — BP 100/70 | HR 73 | Ht 62.0 in | Wt 181.0 lb

## 2017-10-17 DIAGNOSIS — Z1239 Encounter for other screening for malignant neoplasm of breast: Secondary | ICD-10-CM

## 2017-10-17 DIAGNOSIS — Z01419 Encounter for gynecological examination (general) (routine) without abnormal findings: Secondary | ICD-10-CM | POA: Diagnosis not present

## 2017-10-17 DIAGNOSIS — Z1231 Encounter for screening mammogram for malignant neoplasm of breast: Secondary | ICD-10-CM

## 2017-10-17 DIAGNOSIS — A6004 Herpesviral vulvovaginitis: Secondary | ICD-10-CM | POA: Diagnosis not present

## 2017-10-17 DIAGNOSIS — Z0000 Encounter for general adult medical examination without abnormal findings: Secondary | ICD-10-CM

## 2017-10-17 DIAGNOSIS — Z113 Encounter for screening for infections with a predominantly sexual mode of transmission: Secondary | ICD-10-CM | POA: Diagnosis not present

## 2017-10-17 DIAGNOSIS — Z131 Encounter for screening for diabetes mellitus: Secondary | ICD-10-CM

## 2017-10-17 DIAGNOSIS — Z13220 Encounter for screening for lipoid disorders: Secondary | ICD-10-CM | POA: Diagnosis not present

## 2017-10-17 LAB — COMPREHENSIVE METABOLIC PANEL
ALBUMIN: 3.7 g/dL (ref 3.5–5.0)
ALT: 14 U/L (ref 14–54)
AST: 17 U/L (ref 15–41)
Alkaline Phosphatase: 78 U/L (ref 38–126)
Anion gap: 4 — ABNORMAL LOW (ref 5–15)
BUN: 9 mg/dL (ref 6–20)
CHLORIDE: 108 mmol/L (ref 101–111)
CO2: 25 mmol/L (ref 22–32)
CREATININE: 0.69 mg/dL (ref 0.44–1.00)
Calcium: 8.6 mg/dL — ABNORMAL LOW (ref 8.9–10.3)
GFR calc Af Amer: >60 mL/min (ref >60)
GFR calc non Af Amer: >60 mL/min (ref >60)
GLUCOSE: 102 mg/dL — AB (ref 65–99)
POTASSIUM: 3.9 mmol/L (ref 3.5–5.1)
Sodium: 137 mmol/L (ref 135–145)
Total Bilirubin: 0.4 mg/dL (ref 0.3–1.2)
Total Protein: 6.9 g/dL (ref 6.5–8.1)

## 2017-10-17 LAB — LIPID PANEL
CHOL/HDL RATIO: 3.2 ratio
Cholesterol: 172 mg/dL (ref 0–200)
HDL: 53 mg/dL (ref >40)
LDL CALC: 102 mg/dL — AB (ref 0–99)
Triglycerides: 83 mg/dL (ref <150)
VLDL: 17 mg/dL (ref 0–40)

## 2017-10-17 LAB — HEMOGLOBIN A1C
HEMOGLOBIN A1C: 5.7 % — AB (ref 4.8–5.6)
MEAN PLASMA GLUCOSE: 116.89 mg/dL

## 2017-10-17 MED ORDER — VALACYCLOVIR HCL 500 MG PO TABS: 500.0000 mg | ORAL_TABLET | Freq: Two times a day (BID) | ORAL | 1 refills | Status: DC

## 2017-10-17 NOTE — Telephone Encounter (Deleted)
10/23/17 AT 10:50 FOR NEXPLANON INSERT WITH ABC

## 2017-10-17 NOTE — Progress Notes (Signed)
PCP:  Steele Sizer, MD   Chief Complaint  Patient presents with  . Gynecologic Exam     HPI:      [name] is a 41 y.o. No obstetric history on file. who LMP was No LMP recorded. Patient has had an ablation., presents today for her annual examination.  Her menses are infrequent and light again. Pt was amenorrheic with ablation initially but had heavy periods last yr. Had neg labs and sx resolved. Pt doing well now.   Sex activity: single partner, contraception - tubal ligation.  Last Pap: October 06, 2015  Results were: no abnormalities  Hx of STDs: HSV, takes valtrex prn--needs Rx RF  Last mammo 12/18/16; WNL There is a FH of breast cancer in her pat aunt and PGM, genetic testing not indicated. There is no FH of ovarian cancer. The patient does do self-breast exams.  Tobacco use: The patient denies current or previous tobacco use. Alcohol use: none No drug use.  Exercise: not active  She does get adequate calcium and Vitamin D in her diet.  She would like labs done. FH hyperlipidemia and wants checked. Also wants STD screening--no sx/known exposures. Does at [facility] labs.   Past Medical History:  Diagnosis Date  . Cervical dysplasia    CINII  . Constipation   . Herpes   . Migraine   . Obesity   . Pap smear abnormality of vagina with ASC-US   . Plantar fasciitis   . Snoring   . Vitamin D deficiency     Past Surgical History:  Procedure Laterality Date  . COLPOSCOPY    . ENDOMETRIAL ABLATION    . LEEP    . TUBAL LIGATION      Family History  Problem Relation Age of Onset  . Sleep apnea Mother   . Sleep apnea Father   . Allergic rhinitis Daughter   . Allergic rhinitis Son   . Breast cancer Paternal Grandmother   . Brain cancer Paternal Grandmother   . Cancer Paternal Grandmother        breast  4  . Breast cancer Maternal Aunt   . Bone cancer Maternal Aunt   . Cancer Maternal Aunt        breast 80  . Colon cancer Maternal Uncle   . Lung  cancer Maternal Uncle   . Prostate cancer Maternal Uncle     Social History   Socioeconomic History  . Marital status: Married    Spouse name: Not on file  . Number of children: Not on file  . Years of education: Not on file  . Highest education level: Not on file  Social Needs  . Financial resource strain: Not on file  . Food insecurity - worry: Not on file  . Food insecurity - inability: Not on file  . Transportation needs - medical: Not on file  . Transportation needs - non-medical: Not on file  Occupational History  . Not on file  Tobacco Use  . Smoking status: Never Smoker  . Smokeless tobacco: Never Used  Substance and Sexual Activity  . Alcohol use: No    Alcohol/week: 0.0 oz  . Drug use: No  . Sexual activity: Yes    Partners: Male  Other Topics Concern  . Not on file  Social History Narrative  . Not on file    No outpatient medications have been marked as taking for the 10/17/17 encounter (Office Visit) with Copland, Deirdre Evener, PA-C.  ROS:  Review of Systems  Constitutional: Negative for fatigue, fever and unexpected weight change.  Respiratory: Negative for cough, shortness of breath and wheezing.   Cardiovascular: Negative for chest pain, palpitations and leg swelling.  Gastrointestinal: Negative for blood in stool, constipation, diarrhea, nausea and vomiting.  Endocrine: Negative for cold intolerance, heat intolerance and polyuria.  Genitourinary: Negative for dyspareunia, dysuria, flank pain, frequency, genital sores, hematuria, menstrual problem, pelvic pain, urgency, vaginal bleeding, vaginal discharge and vaginal pain.  Musculoskeletal: Negative for back pain, joint swelling and myalgias.  Skin: Negative for rash.  Neurological: Negative for dizziness, syncope, light-headedness, numbness and headaches.  Hematological: Negative for adenopathy.  Psychiatric/Behavioral: Negative for agitation, confusion, sleep disturbance and suicidal ideas. The  patient is not nervous/anxious.      Objective: BP 100/70   Pulse 73   Ht 5\' 2"  (1.575 m)   Wt 181 lb (82.1 kg)   BMI 33.11 kg/m    Physical Exam  Constitutional: She is oriented to person, place, and time. She appears well-developed and well-nourished.  Genitourinary: Vagina normal and uterus normal. There is no rash or tenderness on the right labia. There is no rash or tenderness on the left labia. No erythema or tenderness in the vagina. No vaginal discharge found. Right adnexum does not display mass and does not display tenderness. Left adnexum does not display mass and does not display tenderness. Cervix does not exhibit motion tenderness or polyp. Uterus is not enlarged or tender.  Neck: Normal range of motion. No thyromegaly present.  Cardiovascular: Normal rate, regular rhythm and normal heart sounds.  No murmur heard. Pulmonary/Chest: Effort normal and breath sounds normal. Right breast exhibits no mass, no nipple discharge, no skin change and no tenderness. Left breast exhibits no mass, no nipple discharge, no skin change and no tenderness.  Abdominal: Soft. There is no tenderness. There is no guarding.  Musculoskeletal: Normal range of motion.  Neurological: She is alert and oriented to person, place, and time. No cranial nerve deficit.  Psychiatric: She has a normal mood and affect. Her behavior is normal.  Vitals reviewed.   Assessment/Plan: Encounter for annual routine gynecological examination  Screening for breast cancer - Pt to sched mammo. - Plan: MM DIGITAL SCREENING BILATERAL  Blood tests for routine general physical examination - Plan: Comprehensive metabolic panel, Lipid panel, Hemoglobin A1c, Chlamydia/Gonococcus/Trichomonas, NAA  Screening cholesterol level - Plan: Lipid panel  Screening for diabetes mellitus - Plan: Hemoglobin A1c  Screening for STD (sexually transmitted disease) - Plan: Chlamydia/Gonococcus/Trichomonas, NAA, HIV antibody, RPR, Hepatitis  C Antibody  Herpes simplex vulvovaginitis - Rx RF valtrex - Plan: valACYclovir (VALTREX) 500 MG tablet  Meds ordered this encounter  Medications  . valACYclovir (VALTREX) 500 MG tablet    Sig: Take 1 tablet (500 mg total) by mouth 2 (two) times daily. For 3 days prn sx    Dispense:  30 tablet    Refill:  1             GYN counsel breast self exam, mammography screening, adequate intake of calcium and vitamin D, diet and exercise     F/U  Return in about 1 year (around 10/17/2018).  Alicia B. Copland, PA-C 10/17/2017 11:41 AM

## 2017-10-17 NOTE — Telephone Encounter (Deleted)
WRONG PT PLEASE IGNORE

## 2017-10-17 NOTE — Patient Instructions (Signed)
I value your feedback and entrusting us with your care. If you get a [facility] patient survey, I would appreciate you taking the time to let us know about your experience today. Thank you! 

## 2017-10-18 LAB — HEPATITIS C ANTIBODY: HCV Ab: <0.1 s/co ratio (ref 0.0–0.9)

## 2017-10-18 LAB — HIV ANTIBODY (ROUTINE TESTING W REFLEX): HIV SCREEN 4TH GENERATION: NONREACTIVE

## 2017-10-18 LAB — RPR: RPR Ser Ql: NONREACTIVE

## 2017-10-19 LAB — CHLAMYDIA/GONOCOCCUS/TRICHOMONAS, NAA
Chlamydia by NAA: NEGATIVE
Gonococcus by NAA: NEGATIVE
TRICH VAG BY NAA: NEGATIVE

## 2017-10-23 ENCOUNTER — Ambulatory Visit: Payer: 59 | Admitting: Obstetrics and Gynecology

## 2017-10-23 NOTE — Telephone Encounter (Signed)
Pt aware of lab results. Recommend diet/exericse/wt loss for LDL and HgA1C. Rechk in 1 yr.

## 2017-12-18 ENCOUNTER — Encounter (INDEPENDENT_AMBULATORY_CARE_PROVIDER_SITE_OTHER): Payer: Self-pay

## 2017-12-23 ENCOUNTER — Ambulatory Visit: Payer: Self-pay

## 2017-12-30 ENCOUNTER — Ambulatory Visit: Payer: 59 | Admitting: Family Medicine

## 2017-12-31 ENCOUNTER — Ambulatory Visit
Admission: RE | Admit: 2017-12-31 | Discharge: 2017-12-31 | Disposition: A | Discharge status: Home / Self Care | Payer: No Typology Code available for payment source | Source: Ambulatory Visit | Attending: Obstetrics and Gynecology | Admitting: Obstetrics and Gynecology

## 2017-12-31 DIAGNOSIS — Z1231 Encounter for screening mammogram for malignant neoplasm of breast: Secondary | ICD-10-CM | POA: Insufficient documentation

## 2017-12-31 DIAGNOSIS — Z1239 Encounter for other screening for malignant neoplasm of breast: Secondary | ICD-10-CM

## 2018-01-01 ENCOUNTER — Encounter: Payer: Self-pay | Admitting: Obstetrics and Gynecology

## 2018-01-02 ENCOUNTER — Encounter (INDEPENDENT_AMBULATORY_CARE_PROVIDER_SITE_OTHER): Payer: Self-pay

## 2018-01-02 ENCOUNTER — Ambulatory Visit (INDEPENDENT_AMBULATORY_CARE_PROVIDER_SITE_OTHER): Payer: 59 | Admitting: Family Medicine

## 2018-02-28 ENCOUNTER — Other Ambulatory Visit: Payer: Self-pay | Admitting: Family Medicine

## 2018-07-04 ENCOUNTER — Encounter: Payer: Self-pay | Admitting: Family Medicine

## 2018-07-04 ENCOUNTER — Ambulatory Visit (INDEPENDENT_AMBULATORY_CARE_PROVIDER_SITE_OTHER): Payer: No Typology Code available for payment source | Admitting: Family Medicine

## 2018-07-04 VITALS — BP 120/70 | HR 86 | Temp 98.0°F | Resp 16 | Ht 61.0 in | Wt 178.4 lb

## 2018-07-04 DIAGNOSIS — J3089 Other allergic rhinitis: Secondary | ICD-10-CM

## 2018-07-04 DIAGNOSIS — T783XXA Angioneurotic edema, initial encounter: Secondary | ICD-10-CM | POA: Diagnosis not present

## 2018-07-04 MED ORDER — DEXAMETHASONE SODIUM PHOSPHATE 4 MG/ML IJ SOLN: 8.0000 mg | Freq: Once | INTRAMUSCULAR | Status: DC

## 2018-07-04 MED ORDER — DIPHENHYDRAMINE HCL 50 MG/ML IJ SOLN
50.0000 mg | Freq: Once | INTRAMUSCULAR | Status: AC
  Administered 2018-07-04: 50 mg via INTRAMUSCULAR

## 2018-07-04 MED ORDER — EPINEPHRINE 0.3 MG/0.3ML IJ SOAJ: 0.3000 mg | Freq: Once | INTRAMUSCULAR | 1 refills | Status: AC

## 2018-07-04 MED ORDER — TRIAMCINOLONE ACETONIDE 40 MG/ML IJ SUSP
40.0000 mg | Freq: Once | INTRAMUSCULAR | Status: AC
  Administered 2018-07-04: 40 mg via INTRAMUSCULAR

## 2018-07-04 MED ORDER — DIPHENHYDRAMINE HCL 25 MG PO CAPS: 25.0000 mg | ORAL_CAPSULE | Freq: Once | ORAL | Status: DC

## 2018-07-04 NOTE — Progress Notes (Signed)
Name: Lori Beltran   MRN: [id number]    DOB: 10-30-1975   Date:07/04/2018       Progress Note  Subjective  Chief Complaint  Chief Complaint  Patient presents with  . Eye Problem    Onset-yesterday at lunch, Right Eye Swelled up.  Has been taking Benadryl and Allegra but her eye is still swollen and itchy.     HPI  Angioedema: she has seen Dr. Donneta Romberg in the past. She does not have food allergies, but has environmental allergies - "everything outside and inside " . 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 Her right eye was completely closed within minutes. She took some benadryl but is still swollen this am. She denies wheezing, cough or SOB. Discussed importance of taking anti-histamines daily and to have epinephrine with her at all times. Also carry benadryl in her purse. She denies palpitation, nausea, vomiting or diarrhea. No hives    Patient Active Problem List   Diagnosis Date Noted  . Angioedema 07/04/2018  . 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 Chronic constipation 07/15/2015  . History of iron deficiency anemia 07/15/2015  . Migraine without aura and without status migrainosus, not intractable 05/04/2015  . Environmental and seasonal allergies 05/04/2015  . Obesity (BMI 30.0-34.9) 05/04/2015  . Plantar fasciitis of left foot 05/04/2015  . Vitamin D deficiency 05/04/2015  . Snoring 05/04/2015  . GERD (gastroesophageal reflux disease) 05/04/2015    Past Surgical History:  Procedure Laterality Date  . COLPOSCOPY    . ENDOMETRIAL ABLATION    . LEEP    . TUBAL LIGATION      Family History  Problem Relation Age of Onset  . Sleep apnea Mother   . Sleep apnea Father   . Allergic rhinitis Daughter   . Allergic rhinitis Son   . Breast cancer Paternal Grandmother   . Brain cancer Paternal Grandmother   .  Cancer Paternal Grandmother        breast  68  . Breast cancer Maternal Aunt   . Bone cancer Maternal Aunt   . Cancer Maternal Aunt        breast 80  . Colon cancer Maternal Uncle   . Lung cancer Maternal Uncle   . Prostate cancer Maternal Uncle     Social History   Socioeconomic History  . Marital status: Married    Spouse name: Not on file  . Number of children: Not on file  . Years of education: Not on file  . Highest education level: Not on file  Occupational History  . Not on file  Social Needs  . Financial resource strain: Not on file  . Food insecurity:    Worry: Not on file    Inability: Not on file  . Transportation needs:    Medical: Not on file    Non-medical: Not on file  Tobacco Use  . Smoking status: Never Smoker  . Smokeless tobacco: Never Used  Substance and Sexual Activity  . Alcohol use: No    Alcohol/week: 0.0 standard drinks  . Drug use: No  . Sexual activity: Yes    Partners: Male  Lifestyle  . Physical activity:    Days per week: Not on file    Minutes per session: Not on file  . Stress: Not on file  Relationships  .  Social connections:    Talks on phone: Not on file    Gets together: Not on file    Attends religious service: Not on file    Active member of club or organization: Not on file    Attends meetings of clubs or organizations: Not on file    Relationship status: Not on file  . Intimate partner violence:    Fear of current or ex partner: Not on file    Emotionally abused: Not on file    Physically abused: Not on file    Forced sexual activity: Not on file  Other Topics Concern  . Not on file  Social History Narrative  . Not on file     Current Outpatient Medications:  .  EPINEPHrine (AUVI-Q) 0.3 mg/0.3 mL IJ SOAJ injection, Inject 0.3 mLs (0.3 mg total) into the muscle once for 1 dose., Disp: 4 Device, Rfl: 1 .  fluticasone (FLONASE) 50 MCG/ACT nasal spray, PLACE 2 SPRAYS INTO BOTH NOSTRILS DAILY., Disp: 16 g, Rfl: 6 .   linaclotide (LINZESS) 72 MCG capsule, Take 1 capsule (72 mcg total) by mouth daily., Disp: 30 capsule, Rfl: 2 .  loratadine (CLARITIN) 10 MG tablet, Take by mouth., Disp: , Rfl:  .  Multiple Vitamins-Minerals (MULTIVITAL) tablet, Take by mouth., Disp: , Rfl:  .  omeprazole (PRILOSEC) 40 MG capsule, Take 1 capsule (40 mg total) daily by mouth., Disp: 30 capsule, Rfl: 3 .  ranitidine (ZANTAC) 150 MG tablet, Take 1 tablet (150 mg total) by mouth 2 (two) times daily., Disp: 60 tablet, Rfl: 5 .  valACYclovir (VALTREX) 500 MG tablet, Take 1 tablet (500 mg total) by mouth 2 (two) times daily. For 3 days prn sx, Disp: 30 tablet, Rfl: 1  No Known Allergies  I personally reviewed active problem list, medication list, allergies, family history, social history with the patient/caregiver today.   ROS  Ten systems reviewed and is negative except as mentioned in HPI   Objective  Vitals:   07/04/18 0818  BP: 120/70  Pulse: 86  Resp: 16  Temp: 98 F (36.7 C)  TempSrc: Oral  SpO2: 98%  Weight: 178 lb 6.4 oz (80.9 kg)  Height: 5\' 1"  (1.549 m)    Body mass index is 33.71 kg/m.  Physical Exam  Constitutional: Patient appears well-developed and well-nourished. Obese  No distress.  HEENT: head atraumatic, normocephalic, angioedema right eye, barely able to see iris ,neck supple, throat within normal limits Cardiovascular: Normal rate, regular rhythm and normal heart sounds.  No murmur heard. No BLE edema. Pulmonary/Chest: Effort normal and breath sounds normal. No respiratory distress. Abdominal: Soft.  There is no tenderness. Psychiatric: Patient has a normal mood and affect. behavior is normal. Judgment and thought content normal.  PHQ2/9: Depression screen [facility] 2/9 07/04/2018 08/29/2017 08/07/2017 04/30/2016 07/15/2015  Decreased Interest 0 1 0 0 0  Down, Depressed, Hopeless 0 1 0 0 0  PHQ - 2 Score 0 2 0 0 0  Altered sleeping 0 1 - - -  Tired, decreased energy 1 1 - - -  Change in appetite  1 1 - - -  Feeling bad or failure about yourself  0 1 - - -  Trouble concentrating 0 0 - - -  Moving slowly or fidgety/restless 0 0 - - -  Suicidal thoughts 0 0 - - -  PHQ-9 Score 2 6 - - -  Difficult doing work/chores Not difficult at all Somewhat difficult - - -  Fall Risk: Fall Risk  07/04/2018 08/29/2017 08/07/2017 04/30/2016 07/15/2015  Falls in the past year? No No No No No      Assessment & Plan  1. Angioedema, initial encounter  - diphenhydrAMINE (BENADRYL) capsule 25 mg - kenalog 40 mg IM  - EPINEPHrine (AUVI-Q) 0.3 mg/0.3 mL IJ SOAJ injection; Inject 0.3 mLs (0.3 mg total) into the muscle once for 1 dose.  Dispense: 4 Device; Refill: 1  2. Environmental and seasonal allergies

## 2018-07-10 ENCOUNTER — Other Ambulatory Visit: Payer: Self-pay | Admitting: Obstetrics and Gynecology

## 2018-07-10 DIAGNOSIS — A6004 Herpesviral vulvovaginitis: Secondary | ICD-10-CM

## 2018-07-10 NOTE — Telephone Encounter (Signed)
Please advise 

## 2018-07-17 ENCOUNTER — Other Ambulatory Visit: Payer: Self-pay

## 2018-07-17 MED ORDER — EPINEPHRINE 0.15 MG/0.3ML IJ SOAJ: 0.1500 mg | INTRAMUSCULAR | 0 refills | Status: DC | PRN

## 2018-07-17 NOTE — Telephone Encounter (Signed)
Refill request for general medication. EpiPen to [facility]. [facility] was unable to fill the new Epi-Pens and the regular size Epi-Pen is on backstock at [facility]. Please send to [facility] per patient request.   Last office visit 07/04/2018   No follow-ups on file.

## 2018-08-15 ENCOUNTER — Other Ambulatory Visit: Payer: Self-pay

## 2018-08-15 MED ORDER — EPINEPHRINE 0.15 MG/0.15ML IJ SOAJ: 0.1500 mg | INTRAMUSCULAR | 1 refills | Status: DC | PRN

## 2018-08-15 MED ORDER — EPINEPHRINE 0.3 MG/0.3ML IJ SOAJ: 0.3000 mg | Freq: Once | INTRAMUSCULAR | 1 refills | Status: AC

## 2018-08-15 NOTE — Telephone Encounter (Signed)
[facility] stated the Auvi-Q was on recall and did not have the smaller EpiPen on hand. Patient is requesting we send them to [facility] since they do have them in stock. Thanks

## 2018-09-10 ENCOUNTER — Encounter: Payer: Self-pay | Admitting: Family Medicine

## 2018-09-10 ENCOUNTER — Ambulatory Visit (INDEPENDENT_AMBULATORY_CARE_PROVIDER_SITE_OTHER): Payer: No Typology Code available for payment source | Admitting: Family Medicine

## 2018-09-10 VITALS — BP 114/80 | HR 80 | Temp 98.1°F | Resp 16 | Ht 61.0 in | Wt 178.0 lb

## 2018-09-10 DIAGNOSIS — R8290 Unspecified abnormal findings in urine: Secondary | ICD-10-CM

## 2018-09-10 LAB — POCT URINALYSIS DIPSTICK
Appearance: ABNORMAL
BILIRUBIN UA: NEGATIVE
Glucose, UA: NEGATIVE
Ketones, UA: NEGATIVE
Nitrite, UA: POSITIVE
Odor: ABNORMAL
PH UA: 6 (ref 5.0–8.0)
Protein, UA: NEGATIVE
RBC UA: POSITIVE
Spec Grav, UA: 1.01 (ref 1.010–1.025)
UROBILINOGEN UA: 0.2 U/dL

## 2018-09-10 MED ORDER — CIPROFLOXACIN HCL 250 MG PO TABS: 250.0000 mg | ORAL_TABLET | Freq: Two times a day (BID) | ORAL | 0 refills | Status: DC

## 2018-09-10 NOTE — Addendum Note (Signed)
Addended by: Inda Coke on: 09/10/2018 02:11 PM   Modules accepted: Orders

## 2018-09-10 NOTE — Progress Notes (Signed)
Name: Lori Beltran   MRN: [id number]    DOB: 07/28/1976   Date:09/10/2018       Progress Note  Subjective  Chief Complaint  Chief Complaint  Patient presents with  . Urinary Tract Infection  . Back Pain    shooting pain.    HPI  Urine odor: she noticed an urine odor about 6 weeks ago. She states she increased water intake but symptoms did not improve. She states this week she has also noticed low back pain . Denies dysuria, no hematuria. She had an ablation years ago and did not cycles for a long time, but two months ago she had a cycle for 2.5 weeks but light  and spotting about 1 week later. She states cycles have been about 6 months lately. She denies vaginal odor or discharge. She denies fever, chills , nausea or vomiting   Patient Active Problem List   Diagnosis Date Noted  . Angioedema 07/04/2018  . 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 Chronic constipation 07/15/2015  . History of iron deficiency anemia 07/15/2015  . Migraine without aura and without status migrainosus, not intractable 05/04/2015  . Environmental and seasonal allergies 05/04/2015  . Obesity (BMI 30.0-34.9) 05/04/2015  . Plantar fasciitis of left foot 05/04/2015  . Vitamin D deficiency 05/04/2015  . Snoring 05/04/2015  . GERD (gastroesophageal reflux disease) 05/04/2015    Past Surgical History:  Procedure Laterality Date  . COLPOSCOPY    . ENDOMETRIAL ABLATION    . LEEP    . TUBAL LIGATION      Family History  Problem Relation Age of Onset  . Sleep apnea Mother   . Sleep apnea Father   . Allergic rhinitis Daughter   . Allergic rhinitis Son   . Breast cancer Paternal Grandmother   . Brain cancer Paternal Grandmother   . Cancer Paternal Grandmother        breast  30  . Breast cancer Maternal Aunt   . Bone cancer Maternal Aunt   . Cancer Maternal Aunt        breast 80  . Colon cancer Maternal Uncle   . Lung cancer Maternal Uncle   .  Prostate cancer Maternal Uncle     Social History   Socioeconomic History  . Marital status: Married    Spouse name: Gerald Stabs   . Number of children: 3  . Years of education: Not on file  . Highest education level: Associate degree: occupational, Hotel manager, or vocational program  Occupational History  . Not on file  Social Needs  . Financial resource strain: Not hard at all  . Food insecurity:    Worry: Never true    Inability: Never true  . Transportation needs:    Medical: No    Non-medical: No  Tobacco Use  . Smoking status: Never Smoker  . Smokeless tobacco: Never Used  Substance and Sexual Activity  . Alcohol use: No    Alcohol/week: 0.0 standard drinks  . Drug use: No  . Sexual activity: Yes    Partners: Male  Lifestyle  . Physical activity:    Days per week: 4 days    Minutes per session: 60 min  . Stress: Not at all  Relationships  . Social connections:    Talks on phone: More than three times a week    Gets together: More than three times a week    Attends religious service: More than 4 times  per year    Active member of club or organization: Yes    Attends meetings of clubs or organizations: More than 4 times per year    Relationship status: Married  . Intimate partner violence:    Fear of current or ex partner: No    Emotionally abused: No    Physically abused: No    Forced sexual activity: No  Other Topics Concern  . Not on file  Social History Narrative  . Not on file     Current Outpatient Medications:  .  EPINEPHrine 0.15 MG/0.15ML IJ injection, Inject 0.15 mLs (0.15 mg total) into the muscle as needed for anaphylaxis., Disp: 2 Device, Rfl: 1 .  fluticasone (FLONASE) 50 MCG/ACT nasal spray, PLACE 2 SPRAYS INTO BOTH NOSTRILS DAILY., Disp: 16 g, Rfl: 6 .  linaclotide (LINZESS) 72 MCG capsule, Take 1 capsule (72 mcg total) by mouth daily., Disp: 30 capsule, Rfl: 2 .  loratadine (CLARITIN) 10 MG tablet, Take by mouth., Disp: , Rfl:  .  Multiple  Vitamins-Minerals (MULTIVITAL) tablet, Take by mouth., Disp: , Rfl:  .  omeprazole (PRILOSEC) 40 MG capsule, Take 1 capsule (40 mg total) daily by mouth., Disp: 30 capsule, Rfl: 3 .  ranitidine (ZANTAC) 150 MG tablet, Take 1 tablet (150 mg total) by mouth 2 (two) times daily., Disp: 60 tablet, Rfl: 5 .  valACYclovir (VALTREX) 500 MG tablet, TAKE 1 TABLET BY MOUTH TWICE DAILY FOR 3 DAYS AS NEEDED FOR SYMPTOMS, Disp: 30 tablet, Rfl: 1 .  ciprofloxacin (CIPRO) 250 MG tablet, Take 1 tablet (250 mg total) by mouth 2 (two) times daily., Disp: 6 tablet, Rfl: 0  No Known Allergies  I personally reviewed active problem list, medication list, allergies, family history, social history with the patient/caregiver today.   ROS  Constitutional: Negative for fever or weight change.  Respiratory: Negative for cough and shortness of breath.   Cardiovascular: Negative for chest pain or palpitations.  Gastrointestinal: Negative for abdominal pain, no bowel changes.  Musculoskeletal: Negative for gait problem or joint swelling.  Skin: Negative for rash.  Neurological: Negative for dizziness or headache.  No other specific complaints in a complete review of systems (except as listed in HPI above).  Objective  Vitals:   09/10/18 1212  BP: 114/80  Pulse: 80  Resp: 16  Temp: 98.1 F (36.7 C)  TempSrc: Oral  SpO2: 99%  Weight: 178 lb (80.7 kg)  Height: 5\' 1"  (1.549 m)    Body mass index is 33.63 kg/m.  Physical Exam  Constitutional: Patient appears well-developed and well-nourished. Obese  No distress.  HEENT: head atraumatic, normocephalic, pupils equal and reactive to light,neck supple, throat within normal limits Cardiovascular: Normal rate, regular rhythm and normal heart sounds.  No murmur heard. No BLE edema. Pulmonary/Chest: Effort normal and breath sounds normal. No respiratory distress. Abdominal: Soft.  There is no tenderness.negative CVA tenderness  Psychiatric: Patient has a normal  mood and affect. behavior is normal. Judgment and thought content normal.  Recent Results (from the past 2160 hour(s))  POCT Urinalysis Dipstick     Status: Abnormal   Collection Time: 09/10/18 12:21 PM  Result Value Ref Range   Color, UA dark yellow    Clarity, UA cloudy    Glucose, UA Negative Negative   Bilirubin, UA Negative    Ketones, UA Negative    Spec Grav, UA 1.010 1.010 - 1.025   Blood, UA Positive    pH, UA 6.0 5.0 - 8.0   Protein,  UA Negative Negative   Urobilinogen, UA 0.2 0.2 or 1.0 E.U./dL   Nitrite, UA Positive    Leukocytes, UA Trace (A) Negative   Appearance Abnormal    Odor Abnormal       PHQ2/9: Depression screen [facility] 2/9 07/04/2018 08/29/2017 08/07/2017 04/30/2016 07/15/2015  Decreased Interest 0 1 0 0 0  Down, Depressed, Hopeless 0 1 0 0 0  PHQ - 2 Score 0 2 0 0 0  Altered sleeping 0 1 - - -  Tired, decreased energy 1 1 - - -  Change in appetite 1 1 - - -  Feeling bad or failure about yourself  0 1 - - -  Trouble concentrating 0 0 - - -  Moving slowly or fidgety/restless 0 0 - - -  Suicidal thoughts 0 0 - - -  PHQ-9 Score 2 6 - - -  Difficult doing work/chores Not difficult at all Somewhat difficult - - -     Fall Risk: Fall Risk  09/10/2018 07/04/2018 08/29/2017 08/07/2017 04/30/2016  Falls in the past year? 0 No No No No     Assessment & Plan  1. Abnormal urine odor  - POCT Urinalysis Dipstick - CULTURE, URINE COMPREHENSIVE - ciprofloxacin (CIPRO) 250 MG tablet; Take 1 tablet (250 mg total) by mouth 2 (two) times daily.  Dispense: 6 tablet; Refill: 0

## 2018-09-12 ENCOUNTER — Other Ambulatory Visit: Payer: Self-pay | Admitting: Family Medicine

## 2018-09-12 ENCOUNTER — Other Ambulatory Visit: Payer: Self-pay

## 2018-09-12 LAB — URINE CULTURE
MICRO NUMBER:: 91400536
SPECIMEN QUALITY:: ADEQUATE

## 2018-09-12 MED ORDER — SULFAMETHOXAZOLE-TRIMETHOPRIM 800-160 MG PO TABS: 1.0000 | ORAL_TABLET | Freq: Two times a day (BID) | ORAL | 0 refills | Status: DC

## 2018-10-03 ENCOUNTER — Encounter: Payer: Self-pay | Admitting: Family Medicine

## 2018-10-03 ENCOUNTER — Ambulatory Visit (INDEPENDENT_AMBULATORY_CARE_PROVIDER_SITE_OTHER): Payer: No Typology Code available for payment source | Admitting: Family Medicine

## 2018-10-03 VITALS — BP 110/62 | HR 67 | Temp 98.0°F | Ht 62.0 in | Wt 179.7 lb

## 2018-10-03 DIAGNOSIS — R739 Hyperglycemia, unspecified: Secondary | ICD-10-CM | POA: Diagnosis not present

## 2018-10-03 DIAGNOSIS — Z13220 Encounter for screening for lipoid disorders: Secondary | ICD-10-CM

## 2018-10-03 DIAGNOSIS — R51 Headache: Secondary | ICD-10-CM | POA: Diagnosis not present

## 2018-10-03 DIAGNOSIS — R0683 Snoring: Secondary | ICD-10-CM | POA: Diagnosis not present

## 2018-10-03 DIAGNOSIS — Z79899 Other long term (current) drug therapy: Secondary | ICD-10-CM

## 2018-10-03 DIAGNOSIS — R519 Headache, unspecified: Secondary | ICD-10-CM

## 2018-10-03 NOTE — Progress Notes (Signed)
Name: Lori Beltran   MRN: [id number]    DOB: 1976/02/14   Date:10/03/2018       Progress Note  Subjective  Chief Complaint  Chief Complaint  Patient presents with  . Snoring    HPI  Snoring: her husband noticed that this past week she had multiple pauses during sleep. She also snores, feels tired during the day and wakes up with headaches at times. He is concerned that she has sleep apnea.   Obesity: she has not been following a healthy diet lately, she goes to gym intermittently  Pre-diabetes: she denies polyphagia, polydipsia or polyuria  Constipation: takes linzess prn only, she states varies based on her diet    Patient Active Problem List   Diagnosis Date Noted  . Angioedema 07/04/2018  . 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 Chronic constipation 07/15/2015  . History of iron deficiency anemia 07/15/2015  . Migraine without aura and without status migrainosus, not intractable 05/04/2015  . Environmental and seasonal allergies 05/04/2015  . Obesity (BMI 30.0-34.9) 05/04/2015  . Plantar fasciitis of left foot 05/04/2015  . Vitamin D deficiency 05/04/2015  . Snoring 05/04/2015  . GERD (gastroesophageal reflux disease) 05/04/2015    Past Surgical History:  Procedure Laterality Date  . COLPOSCOPY    . ENDOMETRIAL ABLATION    . LEEP    . TUBAL LIGATION      Family History  Problem Relation Age of Onset  . Sleep apnea Mother   . Sleep apnea Father   . Allergic rhinitis Daughter   . Allergic rhinitis Son   . Breast cancer Paternal Grandmother   . Brain cancer Paternal Grandmother   . Cancer Paternal Grandmother        breast  68  . Breast cancer Maternal Aunt   . Bone cancer Maternal Aunt   . Cancer Maternal Aunt        breast 80  . Colon cancer Maternal Uncle   . Lung cancer Maternal Uncle   . Prostate cancer Maternal Uncle     Social History   Socioeconomic History  . Marital status: Married     Spouse name: Gerald Stabs   . Number of children: 3  . Years of education: Not on file  . Highest education level: Associate degree: occupational, Hotel manager, or vocational program  Occupational History  . Not on file  Social Needs  . Financial resource strain: Not hard at all  . Food insecurity:    Worry: Never true    Inability: Never true  . Transportation needs:    Medical: No    Non-medical: No  Tobacco Use  . Smoking status: Never Smoker  . Smokeless tobacco: Never Used  Substance and Sexual Activity  . Alcohol use: No    Alcohol/week: 0.0 standard drinks  . Drug use: No  . Sexual activity: Yes    Partners: Male  Lifestyle  . Physical activity:    Days per week: 4 days    Minutes per session: 60 min  . Stress: Not at all  Relationships  . Social connections:    Talks on phone: More than three times a week    Gets together: More than three times a week    Attends religious service: More than 4 times per year    Active member of club or organization: Yes    Attends meetings of clubs or organizations: More than 4 times per year  Relationship status: Married  . Intimate partner violence:    Fear of current or ex partner: No    Emotionally abused: No    Physically abused: No    Forced sexual activity: No  Other Topics Concern  . Not on file  Social History Narrative  . Not on file     Current Outpatient Medications:  .  EPINEPHrine 0.15 MG/0.15ML IJ injection, Inject 0.15 mLs (0.15 mg total) into the muscle as needed for anaphylaxis., Disp: 2 Device, Rfl: 1 .  fluticasone (FLONASE) 50 MCG/ACT nasal spray, PLACE 2 SPRAYS INTO BOTH NOSTRILS DAILY., Disp: 16 g, Rfl: 6 .  linaclotide (LINZESS) 72 MCG capsule, Take 1 capsule (72 mcg total) by mouth daily., Disp: 30 capsule, Rfl: 2 .  loratadine (CLARITIN) 10 MG tablet, Take by mouth., Disp: , Rfl:  .  Multiple Vitamins-Minerals (MULTIVITAL) tablet, Take by mouth., Disp: , Rfl:  .  omeprazole (PRILOSEC) 40 MG capsule, Take  1 capsule (40 mg total) daily by mouth., Disp: 30 capsule, Rfl: 3 .  valACYclovir (VALTREX) 500 MG tablet, TAKE 1 TABLET BY MOUTH TWICE DAILY FOR 3 DAYS AS NEEDED FOR SYMPTOMS, Disp: 30 tablet, Rfl: 1  No Known Allergies  I personally reviewed active problem list, medication list, allergies, family history, social history with the patient/caregiver today.   ROS  Constitutional: Negative for fever or weight change.  Respiratory: Negative for cough and shortness of breath.   Cardiovascular: Negative for chest pain or palpitations.  Gastrointestinal: Negative for abdominal pain, no bowel changes.  Musculoskeletal: Negative for gait problem or joint swelling.  Skin: Negative for rash.  Neurological: Negative for dizziness or headache.  No other specific complaints in a complete review of systems (except as listed in HPI above).  Objective    Vitals:   10/03/18 0822  BP: 110/62  Pulse: 67  Temp: 98 F (36.7 C)  TempSrc: Oral  SpO2: 97%  Weight: 179 lb 11.2 oz (81.5 kg)  Height: 5\' 2"  (1.575 m)    Body mass index is 32.87 kg/m.  Physical Exam  Constitutional: Patient appears well-developed and well-nourished. Obese  No distress.  HEENT: head atraumatic, normocephalic, pupils equal and reactive to light,  neck supple, throat within normal limits Cardiovascular: Normal rate, regular rhythm and normal heart sounds.  No murmur heard. No BLE edema. Pulmonary/Chest: Effort normal and breath sounds normal. No respiratory distress. Abdominal: Soft.  There is no tenderness. Psychiatric: Patient has a normal mood and affect. behavior is normal. Judgment and thought content normal.   Recent Results (from the past 2160 hour(s))  POCT Urinalysis Dipstick     Status: Abnormal   Collection Time: 09/10/18 12:21 PM  Result Value Ref Range   Color, UA dark yellow    Clarity, UA cloudy    Glucose, UA Negative Negative   Bilirubin, UA Negative    Ketones, UA Negative    Spec Grav, UA  1.010 1.010 - 1.025   Blood, UA Positive    pH, UA 6.0 5.0 - 8.0   Protein, UA Negative Negative   Urobilinogen, UA 0.2 0.2 or 1.0 E.U./dL   Nitrite, UA Positive    Leukocytes, UA Trace (A) Negative   Appearance Abnormal    Odor Abnormal   Urine Culture     Status: Abnormal   Collection Time: 09/10/18  2:20 PM  Result Value Ref Range   MICRO NUMBER: [id number]    SPECIMEN QUALITY: Adequate    Sample Source URINE  STATUS: FINAL    ISOLATE 1: Escherichia coli (A)     Comment: Greater than 100,000 CFU/mL of Escherichia coli      Susceptibility   Escherichia coli - URINE CULTURE, REFLEX    AMOX/CLAVULANIC 4 Sensitive     AMPICILLIN 4 Sensitive     AMPICILLIN/SULBACTAM <=2 Sensitive     CEFAZOLIN* <=4 Not Reportable      * For infections other than uncomplicated UTIcaused by E. coli, K. pneumoniae or P. mirabilis:Cefazolin is resistant if MIC > or = 8 mcg/mL.(Distinguishing susceptible versus intermediatefor isolates with MIC < or = 4 mcg/mL requiresadditional testing.)For uncomplicated UTI caused by E. coli,K. pneumoniae or P. mirabilis: Cefazolin issusceptible if MIC <32 mcg/mL and predictssusceptible to the oral agents cefaclor, cefdinir,cefpodoxime, cefprozil, cefuroxime, cephalexinand loracarbef.    CEFEPIME <=1 Sensitive     CEFTRIAXONE <=1 Sensitive     CIPROFLOXACIN <=0.25 Sensitive     LEVOFLOXACIN <=0.12 Sensitive     ERTAPENEM <=0.5 Sensitive     GENTAMICIN <=1 Sensitive     IMIPENEM <=0.25 Sensitive     NITROFURANTOIN <=16 Sensitive     PIP/TAZO <=4 Sensitive     TOBRAMYCIN <=1 Sensitive     TRIMETH/SULFA* <=20 Sensitive      * For infections other than uncomplicated UTIcaused by E. coli, K. pneumoniae or P. mirabilis:Cefazolin is resistant if MIC > or = 8 mcg/mL.(Distinguishing susceptible versus intermediatefor isolates with MIC < or = 4 mcg/mL requiresadditional testing.)For uncomplicated UTI caused by E. coli,K. pneumoniae or P. mirabilis: Cefazolin issusceptible if MIC  <32 mcg/mL and predictssusceptible to the oral agents cefaclor, cefdinir,cefpodoxime, cefprozil, cefuroxime, cephalexinand loracarbef.Legend:S = Susceptible  I = IntermediateR = Resistant  NS = Not susceptible* = Not tested  NR = Not reported**NN = See antimicrobic comments     PHQ2/9: Depression screen [facility] 2/9 10/03/2018 07/04/2018 08/29/2017 08/07/2017 04/30/2016  Decreased Interest 0 0 1 0 0  Down, Depressed, Hopeless 0 0 1 0 0  PHQ - 2 Score 0 0 2 0 0  Altered sleeping 3 0 1 - -  Tired, decreased energy 3 1 1  - -  Change in appetite 0 1 1 - -  Feeling bad or failure about yourself  0 0 1 - -  Trouble concentrating 0 0 0 - -  Moving slowly or fidgety/restless 0 0 0 - -  Suicidal thoughts 0 0 0 - -  PHQ-9 Score 6 2 6  - -  Difficult doing work/chores Somewhat difficult Not difficult at all Somewhat difficult - -    Fall Risk: Fall Risk  10/03/2018 09/10/2018 07/04/2018 08/29/2017 08/07/2017  Falls in the past year? - 0 No No No  Number falls in past yr: 0 - - - -  Injury with Fall? 0 - - - -     Assessment & Plan  1. Snoring  - CBC with Differential/Platelet - sleep study - ESS 13  2. Morning headache   3. Hyperglycemia  - Hemoglobin A1c  4. Lipid screening  - Lipid panel  5. Long-term use of high-risk medication  - COMPLETE METABOLIC PANEL WITH GFR

## 2018-10-16 ENCOUNTER — Other Ambulatory Visit: Payer: Self-pay

## 2018-10-16 NOTE — Telephone Encounter (Signed)
Refill request for general medication. Flonase  Last office visit 10/03/2018   No follow-ups on file.

## 2018-10-17 MED ORDER — FLUTICASONE PROPIONATE 50 MCG/ACT NA SUSP: 2.0000 | Freq: Every day | NASAL | 6 refills | Status: DC

## 2018-10-22 NOTE — Progress Notes (Signed)
PCP:  Steele Sizer, MD   Chief Complaint  Patient presents with  . Gynecologic Exam     HPI:      [name] is a 43 y.o. No obstetric history on file. who LMP was No LMP recorded. Patient has had an ablation., presents today for her annual examination.  Her menses are infrequent and light usually. Pt was amenorrheic with ablation initially but had heavy periods 2 yrs ago. Had neg labs and sx resolved.  Sex activity: single partner, contraception - tubal ligation.  Last Pap: October 06, 2015  Results were: no abnormalities  Hx of STDs: HSV, takes valtrex prn--needs Rx RF  Last mammo: 12/31/17; Results: no abnormalities, repeat in 12 months There is a FH of breast cancer in her mat aunt and PGM, genetic testing not indicated. There is no FH of ovarian cancer. The patient does do self-breast exams.  Tobacco use: The patient denies current or previous tobacco use. Alcohol use: none No drug use.  Exercise: not active  She does get adequate calcium and Vitamin D in her diet.  Labs with PCP. Wants HIV testing.   Past Medical History:  Diagnosis Date  . Cervical dysplasia    CINII  . Constipation   . Herpes   . Migraine   . Obesity   . Pap smear abnormality of vagina with ASC-US   . Plantar fasciitis   . Snoring   . Vitamin D deficiency     Past Surgical History:  Procedure Laterality Date  . COLPOSCOPY    . ENDOMETRIAL ABLATION    . LEEP    . TUBAL LIGATION      Family History  Problem Relation Age of Onset  . Sleep apnea Mother   . Sleep apnea Father   . Allergic rhinitis Daughter   . Allergic rhinitis Son   . Breast cancer Paternal Grandmother 59  . Brain cancer Paternal Grandmother   . Breast cancer Maternal Aunt 80  . Bone cancer Maternal Aunt   . Colon cancer Maternal Uncle   . Lung cancer Maternal Uncle   . Prostate cancer Maternal Uncle     Social History   Socioeconomic History  . Marital status: Married    Spouse name: Gerald Stabs     . Number of children: 3  . Years of education: Not on file  . Highest education level: Associate degree: occupational, Hotel manager, or vocational program  Occupational History  . Not on file  Social Needs  . Financial resource strain: Not hard at all  . Food insecurity:    Worry: Never true    Inability: Never true  . Transportation needs:    Medical: No    Non-medical: No  Tobacco Use  . Smoking status: Never Smoker  . Smokeless tobacco: Never Used  Substance and Sexual Activity  . Alcohol use: No    Alcohol/week: 0.0 standard drinks  . Drug use: No  . Sexual activity: Yes    Partners: Male  Lifestyle  . Physical activity:    Days per week: 4 days    Minutes per session: 60 min  . Stress: Not at all  Relationships  . Social connections:    Talks on phone: More than three times a week    Gets together: More than three times a week    Attends religious service: More than 4 times per year    Active member of club or organization: Yes    Attends meetings of clubs  or organizations: More than 4 times per year    Relationship status: Married  . Intimate partner violence:    Fear of current or ex partner: No    Emotionally abused: No    Physically abused: No    Forced sexual activity: No  Other Topics Concern  . Not on file  Social History Narrative  . Not on file    Current Meds  Medication Sig  . EPINEPHrine 0.15 MG/0.15ML IJ injection Inject 0.15 mLs (0.15 mg total) into the muscle as needed for anaphylaxis.  . fluticasone (FLONASE) 50 MCG/ACT nasal spray Place 2 sprays into both nostrils daily.  Marland Kitchen linaclotide (LINZESS) 72 MCG capsule Take 1 capsule (72 mcg total) by mouth daily.  Marland Kitchen loratadine (CLARITIN) 10 MG tablet Take by mouth.  . Multiple Vitamins-Minerals (MULTIVITAL) tablet Take by mouth.  Marland Kitchen omeprazole (PRILOSEC) 40 MG capsule Take 1 capsule (40 mg total) daily by mouth.  . valACYclovir (VALTREX) 500 MG tablet TAKE 1 TABLET BY MOUTH TWICE DAILY FOR 3 DAYS AS  NEEDED FOR SYMPTOMS  . [DISCONTINUED] valACYclovir (VALTREX) 500 MG tablet TAKE 1 TABLET BY MOUTH TWICE DAILY FOR 3 DAYS AS NEEDED FOR SYMPTOMS     ROS:  Review of Systems  Constitutional: Negative for fatigue, fever and unexpected weight change.  Respiratory: Negative for cough, shortness of breath and wheezing.   Cardiovascular: Negative for chest pain, palpitations and leg swelling.  Gastrointestinal: Negative for blood in stool, constipation, diarrhea, nausea and vomiting.  Endocrine: Negative for cold intolerance, heat intolerance and polyuria.  Genitourinary: Negative for dyspareunia, dysuria, flank pain, frequency, genital sores, hematuria, menstrual problem, pelvic pain, urgency, vaginal bleeding, vaginal discharge and vaginal pain.  Musculoskeletal: Negative for back pain, joint swelling and myalgias.  Skin: Negative for rash.  Neurological: Negative for dizziness, syncope, light-headedness, numbness and headaches.  Hematological: Negative for adenopathy.  Psychiatric/Behavioral: Negative for agitation, confusion, sleep disturbance and suicidal ideas. The patient is not nervous/anxious.      Objective: BP 110/70   Pulse 60   Ht 5\' 2"  (1.575 m)   Wt 182 lb (82.6 kg)   BMI 33.29 kg/m    Physical Exam Constitutional:      Appearance: She is well-developed.  Genitourinary:     Vulva, vagina, uterus, right adnexa and left adnexa normal.     No vulval lesion or tenderness noted.     No vaginal discharge, erythema or tenderness.     No cervical motion tenderness or polyp.     Uterus is not enlarged or tender.     No right or left adnexal mass present.     Right adnexa not tender.     Left adnexa not tender.  Neck:     Musculoskeletal: Normal range of motion.     Thyroid: No thyromegaly.  Cardiovascular:     Rate and Rhythm: Normal rate and regular rhythm.     Heart sounds: Normal heart sounds. No murmur.  Pulmonary:     Effort: Pulmonary effort is normal.      Breath sounds: Normal breath sounds.  Chest:     Breasts:        Right: No mass, nipple discharge, skin change or tenderness.        Left: No mass, nipple discharge, skin change or tenderness.  Abdominal:     Palpations: Abdomen is soft.     Tenderness: There is no abdominal tenderness. There is no guarding.  Musculoskeletal: Normal range of motion.  Neurological:  Mental Status: She is alert and oriented to person, place, and time.     Cranial Nerves: No cranial nerve deficit.  Psychiatric:        Behavior: Behavior normal.  Vitals signs reviewed.     Assessment/Plan: Encounter for annual routine gynecological examination  Cervical cancer screening - Plan: Cytology - PAP  Screening for HPV (human papillomavirus) - Plan: Cytology - PAP  Screening for STD (sexually transmitted disease) - Plan: HIV Antibody (routine testing w rflx)  Screening for breast cancer - Pt to sched mammo - Plan: MM 3D SCREEN BREAST BILATERAL  Herpes simplex vulvovaginitis - Rx RF valtrex prn sx. - Plan: valACYclovir (VALTREX) 500 MG tablet  Meds ordered this encounter  Medications  . valACYclovir (VALTREX) 500 MG tablet    Sig: TAKE 1 TABLET BY MOUTH TWICE DAILY FOR 3 DAYS AS NEEDED FOR SYMPTOMS    Dispense:  30 tablet    Refill:  1    Order Specific Question:   Supervising Provider    Answer:   Gae Dry [[id number]]             GYN counsel breast self exam, mammography screening, adequate intake of calcium and vitamin D, diet and exercise     F/U  Return in about 1 year (around 10/24/2019).  Matti Minney B. Torren Maffeo, PA-C 10/23/2018 9:15 AM

## 2018-10-23 ENCOUNTER — Other Ambulatory Visit (HOSPITAL_COMMUNITY)
Admission: RE | Admit: 2018-10-23 | Discharge: 2018-10-23 | Disposition: A | Discharge status: Home / Self Care | Payer: No Typology Code available for payment source | Source: Ambulatory Visit | Attending: Obstetrics and Gynecology | Admitting: Obstetrics and Gynecology

## 2018-10-23 ENCOUNTER — Encounter: Payer: Self-pay | Admitting: Obstetrics and Gynecology

## 2018-10-23 ENCOUNTER — Other Ambulatory Visit: Payer: Self-pay | Admitting: Family Medicine

## 2018-10-23 ENCOUNTER — Ambulatory Visit (INDEPENDENT_AMBULATORY_CARE_PROVIDER_SITE_OTHER): Payer: No Typology Code available for payment source | Admitting: Obstetrics and Gynecology

## 2018-10-23 VITALS — BP 110/70 | HR 60 | Ht 62.0 in | Wt 182.0 lb

## 2018-10-23 DIAGNOSIS — Z124 Encounter for screening for malignant neoplasm of cervix: Secondary | ICD-10-CM | POA: Insufficient documentation

## 2018-10-23 DIAGNOSIS — A6004 Herpesviral vulvovaginitis: Secondary | ICD-10-CM

## 2018-10-23 DIAGNOSIS — Z01419 Encounter for gynecological examination (general) (routine) without abnormal findings: Secondary | ICD-10-CM

## 2018-10-23 DIAGNOSIS — Z1151 Encounter for screening for human papillomavirus (HPV): Secondary | ICD-10-CM

## 2018-10-23 DIAGNOSIS — Z1239 Encounter for other screening for malignant neoplasm of breast: Secondary | ICD-10-CM

## 2018-10-23 DIAGNOSIS — Z113 Encounter for screening for infections with a predominantly sexual mode of transmission: Secondary | ICD-10-CM

## 2018-10-23 LAB — BASIC METABOLIC PANEL
BUN: 9 (ref 4–21)
Creatinine: 0.8 (ref 0.5–1.1)
GLUCOSE: 92
POTASSIUM: 4.5 (ref 3.4–5.3)
SODIUM: 141 (ref 137–147)

## 2018-10-23 LAB — CBC AND DIFFERENTIAL
HCT: 38 (ref 36–46)
HEMOGLOBIN: 12.2 (ref 12.0–16.0)
Platelets: 410 — AB (ref 150–399)
WBC: 6.8

## 2018-10-23 LAB — LIPID PANEL
CHOLESTEROL: 157 (ref 0–200)
HDL: 44 (ref 35–70)
LDL CALC: 88
TRIGLYCERIDES: 124 (ref 40–160)

## 2018-10-23 LAB — HEMOGLOBIN A1C: Hemoglobin A1C: 5.8

## 2018-10-23 MED ORDER — VALACYCLOVIR HCL 500 MG PO TABS: ORAL_TABLET | ORAL | 1 refills | Status: DC

## 2018-10-23 NOTE — Patient Instructions (Signed)
I value your feedback and entrusting us with your care. If you get a [facility] patient survey, I would appreciate you taking the time to let us know about your experience today. Thank you! 

## 2018-10-24 LAB — HIV ANTIBODY (ROUTINE TESTING W REFLEX): HIV Screen 4th Generation wRfx: NONREACTIVE

## 2018-10-24 NOTE — Progress Notes (Signed)
Pls let pt know HIV was neg.

## 2018-10-24 NOTE — Progress Notes (Signed)
Pt aware.

## 2018-10-27 LAB — CYTOLOGY - PAP
Diagnosis: NEGATIVE
HPV (WINDOPATH): NOT DETECTED

## 2018-10-30 LAB — COMPREHENSIVE METABOLIC PANEL
ALBUMIN: 4.1 g/dL (ref 3.5–5.5)
ALK PHOS: 83 IU/L (ref 39–117)
ALT: 11 IU/L (ref 0–32)
AST: 10 IU/L (ref 0–40)
Albumin/Globulin Ratio: 1.7 (ref 1.2–2.2)
BILIRUBIN TOTAL: 0.3 mg/dL (ref 0.0–1.2)
BUN / CREAT RATIO: 11 (ref 9–23)
BUN: 9 mg/dL (ref 6–24)
CHLORIDE: 104 mmol/L (ref 96–106)
CO2: 23 mmol/L (ref 20–29)
Calcium: 9.5 mg/dL (ref 8.7–10.2)
Creatinine, Ser: 0.82 mg/dL (ref 0.57–1.00)
GFR calc Af Amer: 101 mL/min/{1.73_m2} (ref >59)
GFR calc non Af Amer: 87 mL/min/{1.73_m2} (ref >59)
GLOBULIN, TOTAL: 2.4 g/dL (ref 1.5–4.5)
GLUCOSE: 92 mg/dL (ref 65–99)
POTASSIUM: 4.5 mmol/L (ref 3.5–5.2)
SODIUM: 141 mmol/L (ref 134–144)
Total Protein: 6.5 g/dL (ref 6.0–8.5)

## 2018-10-30 LAB — CBC WITH DIFFERENTIAL/PLATELET
BASOS ABS: 0.1 10*3/uL (ref 0.0–0.2)
Basos: 2 %
EOS (ABSOLUTE): 0.2 10*3/uL (ref 0.0–0.4)
Eos: 3 %
HEMOGLOBIN: 12.2 g/dL (ref 11.1–15.9)
Hematocrit: 37.8 % (ref 34.0–46.6)
Immature Grans (Abs): 0.1 10*3/uL (ref 0.0–0.1)
Immature Granulocytes: 1 %
LYMPHS ABS: 2.6 10*3/uL (ref 0.7–3.1)
Lymphs: 38 %
MCH: 28 pg (ref 26.6–33.0)
MCHC: 32.3 g/dL (ref 31.5–35.7)
MCV: 87 fL (ref 79–97)
Monocytes Absolute: 0.5 10*3/uL (ref 0.1–0.9)
Monocytes: 7 %
NEUTROS ABS: 3.5 10*3/uL (ref 1.4–7.0)
Neutrophils: 49 %
Platelets: 410 10*3/uL (ref 150–450)
RBC: 4.36 x10E6/uL (ref 3.77–5.28)
RDW: 14.2 % (ref 12.3–15.4)
WBC: 6.8 10*3/uL (ref 3.4–10.8)

## 2018-10-30 LAB — LIPID PANEL W/O CHOL/HDL RATIO
CHOLESTEROL TOTAL: 157 mg/dL (ref 100–199)
HDL: 44 mg/dL (ref >39)
LDL Calculated: 88 mg/dL (ref 0–99)
Triglycerides: 124 mg/dL (ref 0–149)
VLDL Cholesterol Cal: 25 mg/dL (ref 5–40)

## 2018-10-30 LAB — HGB A1C W/O EAG: Hgb A1c MFr Bld: 5.8 % — ABNORMAL HIGH (ref 4.8–5.6)

## 2018-10-31 ENCOUNTER — Telehealth: Payer: Self-pay | Admitting: Obstetrics and Gynecology

## 2018-10-31 ENCOUNTER — Encounter: Payer: Self-pay | Admitting: Obstetrics and Gynecology

## 2018-10-31 ENCOUNTER — Encounter: Payer: Self-pay | Admitting: Family Medicine

## 2018-10-31 NOTE — Telephone Encounter (Signed)
Patient is calling for labs results. Please advise. 

## 2018-11-03 ENCOUNTER — Encounter: Payer: Self-pay | Admitting: Family Medicine

## 2018-11-06 ENCOUNTER — Ambulatory Visit: Payer: No Typology Code available for payment source | Attending: Internal Medicine

## 2018-11-06 DIAGNOSIS — R0683 Snoring: Secondary | ICD-10-CM | POA: Insufficient documentation

## 2018-11-06 DIAGNOSIS — R51 Headache: Secondary | ICD-10-CM | POA: Diagnosis not present

## 2018-11-21 ENCOUNTER — Other Ambulatory Visit: Payer: Self-pay

## 2018-11-21 DIAGNOSIS — K219 Gastro-esophageal reflux disease without esophagitis: Secondary | ICD-10-CM

## 2018-11-21 MED ORDER — OMEPRAZOLE 40 MG PO CPDR: 40.0000 mg | DELAYED_RELEASE_CAPSULE | Freq: Every day | ORAL | 0 refills | Status: DC

## 2018-11-21 NOTE — Telephone Encounter (Signed)
Refill request for general medication. Omeprazole to [facility]  Last office visit 10/03/2018   No follow-ups on file.

## 2018-12-05 ENCOUNTER — Ambulatory Visit: Payer: No Typology Code available for payment source | Attending: Neurology

## 2018-12-05 DIAGNOSIS — R0683 Snoring: Secondary | ICD-10-CM | POA: Diagnosis present

## 2018-12-30 ENCOUNTER — Other Ambulatory Visit: Payer: Self-pay | Admitting: Family Medicine

## 2018-12-30 DIAGNOSIS — K219 Gastro-esophageal reflux disease without esophagitis: Secondary | ICD-10-CM

## 2019-02-24 ENCOUNTER — Ambulatory Visit: Payer: Self-pay

## 2019-04-22 ENCOUNTER — Ambulatory Visit
Admission: RE | Admit: 2019-04-22 | Discharge: 2019-04-22 | Disposition: A | Discharge status: Home / Self Care | Payer: No Typology Code available for payment source | Source: Ambulatory Visit | Attending: Obstetrics and Gynecology | Admitting: Obstetrics and Gynecology

## 2019-04-22 ENCOUNTER — Other Ambulatory Visit: Payer: Self-pay

## 2019-04-22 DIAGNOSIS — Z1231 Encounter for screening mammogram for malignant neoplasm of breast: Secondary | ICD-10-CM | POA: Diagnosis not present

## 2019-04-22 DIAGNOSIS — Z1239 Encounter for other screening for malignant neoplasm of breast: Secondary | ICD-10-CM

## 2019-04-23 ENCOUNTER — Encounter: Payer: Self-pay | Admitting: Obstetrics and Gynecology

## 2019-07-09 ENCOUNTER — Encounter: Payer: Self-pay | Admitting: Family Medicine

## 2019-07-09 ENCOUNTER — Ambulatory Visit (INDEPENDENT_AMBULATORY_CARE_PROVIDER_SITE_OTHER): Payer: No Typology Code available for payment source | Admitting: Family Medicine

## 2019-07-09 ENCOUNTER — Other Ambulatory Visit: Payer: Self-pay

## 2019-07-09 VITALS — BP 112/64 | HR 85 | Temp 97.7°F | Resp 14 | Ht 62.0 in | Wt 186.5 lb

## 2019-07-09 DIAGNOSIS — M25562 Pain in left knee: Secondary | ICD-10-CM | POA: Diagnosis not present

## 2019-07-09 DIAGNOSIS — T783XXD Angioneurotic edema, subsequent encounter: Secondary | ICD-10-CM | POA: Diagnosis not present

## 2019-07-09 DIAGNOSIS — G8929 Other chronic pain: Secondary | ICD-10-CM | POA: Diagnosis not present

## 2019-07-09 NOTE — Progress Notes (Signed)
Name: Lori Beltran   MRN: [id number]    DOB: [birth year]   Date:07/09/2019       Progress Note  Subjective  Chief Complaint  Chief Complaint  Patient presents with  . Knee Pain    Onset-1 year, left knee wakes her in the middle night with sharp pain.   . Allergies    HPI   Left knee pain: going on for about 6 months, she states nsaid's does not help, activity makes it worse, she states pain is worse in the middle of the night, described a  Sharp across the top of her knee ( over the patella ) no swelling or redness, she stopped exercising because of it  Angioedema: she has a history of recurrent angioedema, it has happened on both hands in the past, however lately mostly of left upper eyelid. Ate shrimp Tuesday pm and while at work on Wednesday am her eyelid swelled shut, she took xyzal and is slightly down but still has it. 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 Angioedema 07/04/2018  . 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 Chronic constipation 07/15/2015  . History of iron deficiency anemia 07/15/2015  . Migraine without aura and without status migrainosus, not intractable 05/04/2015  . Environmental and seasonal allergies 05/04/2015  . Obesity (BMI 30.0-34.9) 05/04/2015  . Plantar fasciitis of left foot 05/04/2015  . Vitamin D deficiency 05/04/2015  . Snoring 05/04/2015  . GERD (gastroesophageal reflux disease) 05/04/2015    Past Surgical History:  Procedure Laterality Date  . COLPOSCOPY    . ENDOMETRIAL ABLATION    . LEEP    . TUBAL LIGATION      Family History  Problem Relation Age of Onset  . Sleep apnea Mother   . Sleep apnea Father   . Allergic rhinitis Daughter   .  Allergic rhinitis Son   . Breast cancer Paternal Grandmother 52  . Brain cancer Paternal Grandmother   . Breast cancer Maternal Aunt 80  . Colon cancer Maternal Uncle   . Lung cancer Maternal Uncle   . Prostate cancer Maternal Uncle   . Bone cancer Maternal Aunt     Social History   Socioeconomic History  . Marital status: Married    Spouse name: Gerald Stabs   . Number of children: 3  . Years of education: Not on file  . Highest education level: Associate degree: occupational, Hotel manager, or vocational program  Occupational History  . Not on file  Social Needs  . Financial resource strain: Not hard at all  . Food insecurity    Worry: Never true    Inability: Never true  . Transportation needs    Medical: No    Non-medical: No  Tobacco Use  . Smoking status: Never Smoker  . Smokeless tobacco: Never Used  Substance and Sexual Activity  . Alcohol use: No    Alcohol/week: 0.0 standard drinks  . Drug use: No  . Sexual activity: Yes    Partners: Male    Birth control/protection: Other-see comments    Comment: Ablation  Lifestyle  . Physical activity    Days per week: 3 days    Minutes per session: 30 min  .  Stress: Not at all  Relationships  . Social connections    Talks on phone: More than three times a week    Gets together: More than three times a week    Attends religious service: More than 4 times per year    Active member of club or organization: Yes    Attends meetings of clubs or organizations: More than 4 times per year    Relationship status: Married  . Intimate partner violence    Fear of current or ex partner: No    Emotionally abused: No    Physically abused: No    Forced sexual activity: No  Other Topics Concern  . Not on file  Social History Narrative  . Not on file     Current Outpatient Medications:  .  fluticasone (FLONASE) 50 MCG/ACT nasal spray, Place 2 sprays into both nostrils daily., Disp: 16 g, Rfl: 6 .  linaclotide (LINZESS) 72 MCG  capsule, Take 1 capsule (72 mcg total) by mouth daily. (Patient taking differently: Take 72 mcg by mouth as needed. ), Disp: 30 capsule, Rfl: 2 .  loratadine (CLARITIN) 10 MG tablet, Take by mouth., Disp: , Rfl:  .  Multiple Vitamins-Minerals (MULTIVITAL) tablet, Take by mouth., Disp: , Rfl:  .  omeprazole (PRILOSEC) 40 MG capsule, TAKE 1 CAPSULE (40 MG TOTAL) BY MOUTH DAILY., Disp: 30 capsule, Rfl: 5 .  valACYclovir (VALTREX) 500 MG tablet, TAKE 1 TABLET BY MOUTH TWICE DAILY FOR 3 DAYS AS NEEDED FOR SYMPTOMS, Disp: 30 tablet, Rfl: 1 .  EPINEPHrine 0.15 MG/0.15ML IJ injection, Inject 0.15 mLs (0.15 mg total) into the muscle as needed for anaphylaxis. (Patient not taking: Reported on 07/09/2019), Disp: 2 Device, Rfl: 1  No Known Allergies  I personally reviewed active problem list, medication list, allergies, family history, social history, health maintenance with the patient/caregiver today.   ROS  Ten systems reviewed and is negative except as mentioned in HPI   Objective  Vitals:   07/09/19 0959  BP: 112/64  Pulse: 85  Resp: 14  Temp: 97.7 F (36.5 C)  TempSrc: Oral  SpO2: 98%  Weight: 186 lb 8 oz (84.6 kg)  Height: 5\' 2"  (1.575 m)    Body mass index is 34.11 kg/m.  Physical Exam  Constitutional: Patient appears well-developed and well-nourished. Obese  No distress.  HEENT: head atraumatic, normocephalic, pupils equal and reactive to light left upper eyelid angioedema Cardiovascular: Normal rate, regular rhythm and normal heart sounds.  No murmur heard. No BLE edema. Pulmonary/Chest: Effort normal and breath sounds normal. No respiratory distress. Abdominal: Soft.  There is no tenderness. Muscular skeletal: no effusion, pain during movement of patella, worse on lateral aspect of patella .  Psychiatric: Patient has a normal mood and affect. behavior is normal. Judgment and thought content normal.  PHQ2/9: Depression screen [facility] 2/9 07/09/2019 10/03/2018 07/04/2018 08/29/2017  08/07/2017  Decreased Interest 0 0 0 1 0  Down, Depressed, Hopeless 0 0 0 1 0  PHQ - 2 Score 0 0 0 2 0  Altered sleeping 0 3 0 1 -  Tired, decreased energy 2 3 1 1  -  Change in appetite 1 0 1 1 -  Feeling bad or failure about yourself  0 0 0 1 -  Trouble concentrating 0 0 0 0 -  Moving slowly or fidgety/restless 0 0 0 0 -  Suicidal thoughts 0 0 0 0 -  PHQ-9 Score 3 6 2 6  -  Difficult doing work/chores Not difficult at  all Somewhat difficult Not difficult at all Somewhat difficult -    phq 9 is negative   Fall Risk: Fall Risk  07/09/2019 10/03/2018 09/10/2018 07/04/2018 08/29/2017  Falls in the past year? 0 - 0 No No  Number falls in past yr: 0 0 - - -  Injury with Fall? 0 0 - - -    Functional Status Survey: Is the patient deaf or have difficulty hearing?: No Does the patient have difficulty seeing, even when wearing glasses/contacts?: Yes(glasses) Does the patient have difficulty concentrating, remembering, or making decisions?: No Does the patient have difficulty walking or climbing stairs?: No Does the patient have difficulty dressing or bathing?: No Does the patient have difficulty doing errands alone such as visiting a doctor's office or shopping?: No    Assessment & Plan  1. Angioedema, subsequent encounter  - Food Allergy Profile  2. Chronic pain of left knee  - Ambulatory referral to Orthopedic Surgery

## 2019-07-10 LAB — FOOD ALLERGY PROFILE
Allergen, Salmon, f41: <0.1 kU/L
Almonds: <0.1 kU/L
CLASS: 0
CLASS: 0
CLASS: 0
CLASS: 0
CLASS: 0
CLASS: 0
CLASS: 0
CLASS: 0
CLASS: 0
CLASS: 0
CLASS: 1
Cashew IgE: <0.1 kU/L
Class: 0
Class: 0
Class: 0
Class: 0
Egg White IgE: <0.1 kU/L
Fish Cod: <0.1 kU/L
Hazelnut: 0.41 kU/L — ABNORMAL HIGH
Milk IgE: 0.11 kU/L — ABNORMAL HIGH
Peanut IgE: 0.32 kU/L — ABNORMAL HIGH
Scallop IgE: <0.1 kU/L
Sesame Seed f10: 0.17 kU/L — ABNORMAL HIGH
Shrimp IgE: 0.24 kU/L — ABNORMAL HIGH
Soybean IgE: <0.1 kU/L
Tuna IgE: <0.1 kU/L
Walnut: <0.1 kU/L
Wheat IgE: 0.16 kU/L — ABNORMAL HIGH

## 2019-07-10 LAB — INTERPRETATION:

## 2019-07-13 ENCOUNTER — Other Ambulatory Visit: Payer: Self-pay | Admitting: Family Medicine

## 2019-07-13 MED ORDER — EPINEPHRINE 0.3 MG/0.3ML IJ SOAJ: 0.3000 mg | INTRAMUSCULAR | 1 refills | Status: DC | PRN

## 2019-07-14 ENCOUNTER — Ambulatory Visit: Payer: No Typology Code available for payment source | Admitting: Family

## 2019-07-17 ENCOUNTER — Ambulatory Visit (INDEPENDENT_AMBULATORY_CARE_PROVIDER_SITE_OTHER): Payer: No Typology Code available for payment source | Admitting: Family

## 2019-07-17 ENCOUNTER — Encounter: Payer: Self-pay | Admitting: Family

## 2019-07-17 ENCOUNTER — Ambulatory Visit (INDEPENDENT_AMBULATORY_CARE_PROVIDER_SITE_OTHER): Payer: No Typology Code available for payment source

## 2019-07-17 VITALS — Ht 62.0 in | Wt 186.0 lb

## 2019-07-17 DIAGNOSIS — M222X2 Patellofemoral disorders, left knee: Secondary | ICD-10-CM

## 2019-07-17 DIAGNOSIS — G8929 Other chronic pain: Secondary | ICD-10-CM

## 2019-07-17 DIAGNOSIS — M25562 Pain in left knee: Secondary | ICD-10-CM | POA: Diagnosis not present

## 2019-07-22 ENCOUNTER — Encounter: Payer: Self-pay | Admitting: Family

## 2019-07-24 DIAGNOSIS — M222X2 Patellofemoral disorders, left knee: Secondary | ICD-10-CM | POA: Diagnosis not present

## 2019-07-24 DIAGNOSIS — G8929 Other chronic pain: Secondary | ICD-10-CM

## 2019-07-24 DIAGNOSIS — M25562 Pain in left knee: Secondary | ICD-10-CM | POA: Diagnosis not present

## 2019-07-24 MED ORDER — METHYLPREDNISOLONE ACETATE 40 MG/ML IJ SUSP
40.0000 mg | INTRAMUSCULAR | Status: AC | PRN
  Administered 2019-07-24: 40 mg via INTRA_ARTICULAR

## 2019-07-24 MED ORDER — LIDOCAINE HCL 1 % IJ SOLN
5.0000 mL | INTRAMUSCULAR | Status: AC | PRN
  Administered 2019-07-24: 5 mL

## 2019-07-24 NOTE — Progress Notes (Signed)
Office Visit Note   Patient: Lori Beltran           Date of Birth: 03/19/1976           MRN: [id number] Visit Date: 07/17/2019              Requested by: Steele Sizer, [place] [address],   [id number] PCP: Steele Sizer, MD  Chief Complaint  Patient presents with  . Left Knee - Pain      HPI: The patient is a 43 year old woman who presents today complaining of a several month history of knee pain.  This is her left knee pain the pain is primarily anterior pain made worse by ambulation and flexion.  She points to the patella is the most painful area.  There is not been any associated swelling she gets mild relief with when she takes Aleve. No associated injury.  Assessment & Plan: Visit Diagnoses:  1. Patellofemoral disorder, left   2. Chronic pain of left knee     Plan: depomedrol injection. Tolerated well. Discussed course. May use aleve or ibu. Rest x 2 weeks. Return if no better.  Follow-Up Instructions: Return in about 4 weeks (around 08/14/2019).   Left Knee Exam   Muscle Strength  The patient has normal left knee strength.  Tenderness  The patient is experiencing tenderness in the patella (very mild patellar tendon).  Range of Motion  The patient has normal left knee ROM.  Tests  Varus: negative Valgus: negative  Other  Erythema: absent Effusion: no effusion present      Patient is alert, oriented, no adenopathy, well-dressed, normal affect, normal respiratory effort.   Imaging: No results found. No images are attached to the encounter.  Labs: Lab Results  Component Value Date   HGBA1C 5.8 (H) 10/23/2018   HGBA1C 5.8 10/23/2018   HGBA1C 5.7 (H) 10/17/2017     Lab Results  Component Value Date   ALBUMIN 4.1 10/23/2018   ALBUMIN 3.7 10/17/2017   ALBUMIN 3.9 04/30/2016    No results found for: MG Lab Results  Component Value Date   VD25OH 17.5 (L) 10/10/2015    No results found for: PREALBUMIN CBC  EXTENDED Latest Ref Rng & Units 10/23/2018 10/23/2018 10/11/2016  WBC 3.4 - 10.8 x10E3/uL 6.8 6.8 6.6  RBC 3.77 - 5.28 x10E6/uL 4.36 - 4.24  HGB 11.1 - 15.9 g/dL 12.2 12.2 12.3  HCT 34.0 - 46.6 % 37.8 38 36.1  PLT 150 - 450 x10E3/uL 410 410(A) 388  NEUTROABS 1.4 - 7.0 x10E3/uL 3.5 - -  LYMPHSABS 0.7 - 3.1 x10E3/uL 2.6 - -     Body mass index is 34.02 kg/m.  Orders:  Orders Placed This Encounter  Procedures  . XR Knee 1-2 Views Left   No orders of the defined types were placed in this encounter.    Procedures: Large Joint Inj: L knee on 07/24/2019 6:48 AM Indications: pain Details: 18 G 1.5 in needle, anteromedial approach Medications: 5 mL lidocaine 1 %; 40 mg methylPREDNISolone acetate 40 MG/ML Consent was given by the patient.      Clinical Data: No additional findings.  ROS:  All other systems negative, except as noted in the HPI. Review of Systems  Constitutional: Negative for chills and fever.  Cardiovascular: Negative for leg swelling.  Musculoskeletal: Positive for arthralgias.  Neurological: Negative for weakness.    Objective: Vital Signs: Ht 5\' 2"  (1.575 m)   Wt 186 lb (84.4 kg)  BMI 34.02 kg/m   Specialty Comments:  No specialty comments available.  PMFS History: Patient Active Problem List   Diagnosis Date Noted  . Angioedema 07/04/2018  . 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 Chronic constipation 07/15/2015  . History of iron deficiency anemia 07/15/2015  . Migraine without aura and without status migrainosus, not intractable 05/04/2015  . Environmental and seasonal allergies 05/04/2015  . Obesity (BMI 30.0-34.9) 05/04/2015  . Plantar fasciitis of left foot 05/04/2015  . Vitamin D deficiency 05/04/2015  . Snoring 05/04/2015  . GERD (gastroesophageal reflux disease) 05/04/2015   Past Medical History:  Diagnosis Date  . Cervical dysplasia    CINII  . Constipation   . Herpes   .  Migraine   . Obesity   . Pap smear abnormality of vagina with ASC-US   . Plantar fasciitis   . Snoring   . Vitamin D deficiency     Family History  Problem Relation Age of Onset  . Sleep apnea Mother   . Sleep apnea Father   . Allergic rhinitis Daughter   . Allergic rhinitis Son   . Breast cancer Paternal Grandmother 93  . Brain cancer Paternal Grandmother   . Breast cancer Maternal Aunt 80  . Colon cancer Maternal Uncle   . Lung cancer Maternal Uncle   . Prostate cancer Maternal Uncle   . Bone cancer Maternal Aunt     Past Surgical History:  Procedure Laterality Date  . COLPOSCOPY    . ENDOMETRIAL ABLATION    . LEEP    . TUBAL LIGATION     Social History   Occupational History  . Not on file  Tobacco Use  . Smoking status: Never Smoker  . Smokeless tobacco: Never Used  Substance and Sexual Activity  . Alcohol use: No    Alcohol/week: 0.0 standard drinks  . Drug use: No  . Sexual activity: Yes    Partners: Male    Birth control/protection: Other-see comments    Comment: Ablation

## 2019-10-25 NOTE — Progress Notes (Signed)
PCP:  Steele Sizer, MD   Chief Complaint  Patient presents with  . Gynecologic Exam    Bleeding/pain during intercourse     HPI:      [name] is a 44 y.o. No obstetric history on file. who LMP was No LMP recorded. Patient has had an ablation., presents today for her annual examination.  Her menses are infrequent and light usually. Pt was amenorrheic with ablation initially.  Sex activity: single partner, contraception - tubal ligation. Has had red bleeding during sex, intermittently for a couple months. Doesn't continue after sex is over. Has occas pelvic tenderness during sex but doesn't correlate with bleeding.  Last Pap: 10/23/18  Results were: no abnormalities /neg HPV DNA Hx of STDs: HSV, takes valtrex prn--needs Rx RF  Last mammo: 04/23/19 Results: no abnormalities, repeat in 12 months There is a FH of breast cancer in her mat aunt and PGM, genetic testing not indicated. There is no FH of ovarian cancer. The patient does do self-breast exams.  Tobacco use: The patient denies current or previous tobacco use. Alcohol use: none No drug use.  Exercise: not active  She does get adequate calcium and Vitamin D in her diet.  Labs with PCP.    Past Medical History:  Diagnosis Date  . Arthritis    left knee  . Cervical dysplasia    CINII  . Constipation   . Herpes   . Migraine   . Obesity   . Pap smear abnormality of vagina with ASC-US   . Plantar fasciitis   . Snoring   . Vitamin D deficiency     Past Surgical History:  Procedure Laterality Date  . COLPOSCOPY    . ENDOMETRIAL ABLATION    . LEEP    . TUBAL LIGATION      Family History  Problem Relation Age of Onset  . Sleep apnea Mother   . Sleep apnea Father   . Allergic rhinitis Daughter   . Allergic rhinitis Son   . Breast cancer Paternal Grandmother 69  . Brain cancer Paternal Grandmother   . Breast cancer Maternal Aunt 80  . Colon cancer Maternal Uncle   . Lung cancer Maternal Uncle    . Prostate cancer Maternal Uncle   . Bone cancer Maternal Aunt     Social History   Socioeconomic History  . Marital status: Married    Spouse name: Gerald Stabs   . Number of children: 3  . Years of education: Not on file  . Highest education level: Associate degree: occupational, Hotel manager, or vocational program  Occupational History  . Not on file  Tobacco Use  . Smoking status: Never Smoker  . Smokeless tobacco: Never Used  Substance and Sexual Activity  . Alcohol use: No    Alcohol/week: 0.0 standard drinks  . Drug use: No  . Sexual activity: Yes    Partners: Male    Birth control/protection: Other-see comments    Comment: Ablation  Other Topics Concern  . Not on file  Social History Narrative  . Not on file   Social Determinants of Health   Financial Resource Strain:   . Difficulty of Paying Living Expenses: Not on file  Food Insecurity:   . Worried About Charity fundraiser in the Last Year: Not on file  . Ran Out of Food in the Last Year: Not on file  Transportation Needs:   . Lack of Transportation (Medical): Not on file  . Lack of Transportation (  Non-Medical): Not on file  Physical Activity: Insufficiently Active  . Days of Exercise per Week: 3 days  . Minutes of Exercise per Session: 30 min  Stress:   . Feeling of Stress : Not on file  Social Connections:   . Frequency of Communication with Friends and Family: Not on file  . Frequency of Social Gatherings with Friends and Family: Not on file  . Attends Religious Services: Not on file  . Active Member of Clubs or Organizations: Not on file  . Attends Archivist Meetings: Not on file  . Marital Status: Not on file  Intimate Partner Violence:   . Fear of Current or Ex-Partner: Not on file  . Emotionally Abused: Not on file  . Physically Abused: Not on file  . Sexually Abused: Not on file    Current Meds  Medication Sig  . EPINEPHrine 0.3 mg/0.3 mL IJ SOAJ injection Inject 0.3 mLs (0.3 mg  total) into the muscle as needed for anaphylaxis.  . fluticasone (FLONASE) 50 MCG/ACT nasal spray Place 2 sprays into both nostrils daily.  Marland Kitchen linaclotide (LINZESS) 72 MCG capsule Take 1 capsule (72 mcg total) by mouth daily. (Patient taking differently: Take 72 mcg by mouth as needed. )  . loratadine (CLARITIN) 10 MG tablet Take by mouth.  . Multiple Vitamins-Minerals (MULTIVITAL) tablet Take by mouth.  Marland Kitchen omeprazole (PRILOSEC) 40 MG capsule TAKE 1 CAPSULE (40 MG TOTAL) BY MOUTH DAILY.  . valACYclovir (VALTREX) 500 MG tablet TAKE 1 TABLET BY MOUTH TWICE DAILY FOR 3 DAYS AS NEEDED FOR SYMPTOMS  . [DISCONTINUED] valACYclovir (VALTREX) 500 MG tablet TAKE 1 TABLET BY MOUTH TWICE DAILY FOR 3 DAYS AS NEEDED FOR SYMPTOMS     ROS:  Review of Systems  Constitutional: Negative for fatigue, fever and unexpected weight change.  Respiratory: Negative for cough, shortness of breath and wheezing.   Cardiovascular: Negative for chest pain, palpitations and leg swelling.  Gastrointestinal: Positive for constipation. Negative for blood in stool, diarrhea, nausea and vomiting.  Endocrine: Negative for cold intolerance, heat intolerance and polyuria.  Genitourinary: Positive for dyspareunia. Negative for dysuria, flank pain, frequency, genital sores, hematuria, menstrual problem, pelvic pain, urgency, vaginal bleeding, vaginal discharge and vaginal pain.  Musculoskeletal: Negative for back pain, joint swelling and myalgias.  Skin: Negative for rash.  Neurological: Negative for dizziness, syncope, light-headedness, numbness and headaches.  Hematological: Negative for adenopathy.  Psychiatric/Behavioral: Negative for agitation, confusion, sleep disturbance and suicidal ideas. The patient is not nervous/anxious.      Objective: BP 110/68 (BP Location: Right Arm, Patient Position: Sitting, Cuff Size: Normal)   Pulse 83   Ht 5\' 2"  (1.575 m)   Wt 187 lb (84.8 kg)   BMI 34.20 kg/m    Physical  Exam Constitutional:      Appearance: She is well-developed.  Genitourinary:     Vulva, vagina, uterus, right adnexa and left adnexa normal.     No vulval lesion or tenderness noted.     No vaginal discharge, erythema or tenderness.     Cervical polyp present.     No cervical motion tenderness.     Uterus is not enlarged or tender.     No right or left adnexal mass present.     Right adnexa not tender.     Left adnexa not tender.     Genitourinary Comments: ENDOCERVICAL POLYP IN CX OS  Neck:     Thyroid: No thyromegaly.  Cardiovascular:     Rate and Rhythm:  Normal rate and regular rhythm.     Heart sounds: Normal heart sounds. No murmur.  Pulmonary:     Effort: Pulmonary effort is normal.     Breath sounds: Normal breath sounds.  Chest:     Breasts:        Right: No mass, nipple discharge, skin change or tenderness.        Left: No mass, nipple discharge, skin change or tenderness.  Abdominal:     Palpations: Abdomen is soft.     Tenderness: There is no abdominal tenderness. There is no guarding.  Musculoskeletal:        General: Normal range of motion.     Cervical back: Normal range of motion.  Neurological:     General: No focal deficit present.     Mental Status: She is alert and oriented to person, place, and time.     Cranial Nerves: No cranial nerve deficit.  Skin:    General: Skin is warm and dry.  Psychiatric:        Mood and Affect: Mood normal.        Behavior: Behavior normal.        Thought Content: Thought content normal.        Judgment: Judgment normal.  Vitals reviewed.   ENDOCX POLYP REM Cervix visualized and polyp noted.  Ring forcep applied to cervix and twisting motion removed polyp intact.  Hemostasis obtained.  Assessment/Plan: Encounter for annual routine gynecological examination  Encounter for screening mammogram for malignant neoplasm of breast - Plan: 3D MAMMOGRAM SCREENING BILATERAL; pt to sched mammo  Endocervical polyp - Plan:  Surgical pathology; Removed without complications. Most likely cause of postcoital bleeding and tenderness. F/u if sx persist. Sent to path.  Postcoital bleeding  Herpes simplex vulvovaginitis - Rx RF valtrex prn sx. - Plan: valACYclovir (VALTREX) 500 MG tablet  Meds ordered this encounter  Medications  . valACYclovir (VALTREX) 500 MG tablet    Sig: TAKE 1 TABLET BY MOUTH TWICE DAILY FOR 3 DAYS AS NEEDED FOR SYMPTOMS    Dispense:  30 tablet    Refill:  1    Order Specific Question:   Supervising Provider    Answer:   Gae Dry [id number]             GYN counsel breast self exam, mammography screening, adequate intake of calcium and vitamin D, diet and exercise     F/U  Return in about 1 year (around 10/25/2020).  Jesiel Garate B. Arianna Delsanto, PA-C 10/26/2019 8:59 AM

## 2019-10-26 ENCOUNTER — Encounter: Payer: Self-pay | Admitting: Obstetrics and Gynecology

## 2019-10-26 ENCOUNTER — Ambulatory Visit (INDEPENDENT_AMBULATORY_CARE_PROVIDER_SITE_OTHER): Payer: No Typology Code available for payment source | Admitting: Obstetrics and Gynecology

## 2019-10-26 ENCOUNTER — Other Ambulatory Visit: Payer: Self-pay

## 2019-10-26 ENCOUNTER — Other Ambulatory Visit (HOSPITAL_COMMUNITY)
Admission: RE | Admit: 2019-10-26 | Discharge: 2019-10-26 | Disposition: A | Discharge status: Home / Self Care | Payer: No Typology Code available for payment source | Source: Ambulatory Visit | Attending: Obstetrics and Gynecology | Admitting: Obstetrics and Gynecology

## 2019-10-26 VITALS — BP 110/68 | HR 83 | Ht 62.0 in | Wt 187.0 lb

## 2019-10-26 DIAGNOSIS — Z01419 Encounter for gynecological examination (general) (routine) without abnormal findings: Secondary | ICD-10-CM

## 2019-10-26 DIAGNOSIS — A6004 Herpesviral vulvovaginitis: Secondary | ICD-10-CM

## 2019-10-26 DIAGNOSIS — N930 Postcoital and contact bleeding: Secondary | ICD-10-CM

## 2019-10-26 DIAGNOSIS — N841 Polyp of cervix uteri: Secondary | ICD-10-CM | POA: Insufficient documentation

## 2019-10-26 DIAGNOSIS — Z1231 Encounter for screening mammogram for malignant neoplasm of breast: Secondary | ICD-10-CM

## 2019-10-26 MED ORDER — VALACYCLOVIR HCL 500 MG PO TABS: ORAL_TABLET | ORAL | 1 refills | Status: DC

## 2019-10-26 NOTE — Patient Instructions (Signed)
I value your feedback and entrusting us with your care. If you get a [facility] patient survey, I would appreciate you taking the time to let us know about your experience today. Thank you!  As of October 01, 2019, your lab results will be released to your MyChart immediately, before I even have a chance to see them. Please give me time to review them and contact you if there are any abnormalities. Thank you for your patience.  

## 2019-10-27 LAB — SURGICAL PATHOLOGY

## 2019-12-29 ENCOUNTER — Encounter: Payer: Self-pay | Admitting: Family Medicine

## 2019-12-29 ENCOUNTER — Other Ambulatory Visit: Payer: Self-pay

## 2019-12-29 ENCOUNTER — Ambulatory Visit (INDEPENDENT_AMBULATORY_CARE_PROVIDER_SITE_OTHER): Payer: No Typology Code available for payment source | Admitting: Family Medicine

## 2019-12-29 VITALS — BP 118/78 | HR 73 | Temp 97.3°F | Resp 14 | Ht 62.0 in | Wt 185.9 lb

## 2019-12-29 DIAGNOSIS — T783XXD Angioneurotic edema, subsequent encounter: Secondary | ICD-10-CM | POA: Diagnosis not present

## 2019-12-29 DIAGNOSIS — M25462 Effusion, left knee: Secondary | ICD-10-CM | POA: Diagnosis not present

## 2019-12-29 MED ORDER — TRIAMCINOLONE ACETONIDE 40 MG/ML IJ SUSP: 40.0000 mg | Freq: Once | INTRAMUSCULAR | Status: DC

## 2019-12-29 MED ORDER — LIDOCAINE HCL (PF) 1 % IJ SOLN: 2.0000 mL | Freq: Once | INTRAMUSCULAR | Status: DC

## 2019-12-29 NOTE — Progress Notes (Signed)
Name: Lori Beltran   MRN: [id number]    DOB: 02-23-1976   Date:12/29/2019       Progress Note  Subjective  Chief Complaint  Chief Complaint  Patient presents with  . Knee Pain    Left knee pain. Ongoing issue    HPI  Left knee pain: she has chronic left knee pain, went to Ortho last year was given steroid injection on left knee and dx with patella femoral syndrome and OA. She states pain improved temporarily but would like to try it again. She has not noticed effusion, redness or increase in warmth but states it is getting more painful to walk and it is bothering her during the night. Affecting her sleep  Angioedema: seen by allergist, she is allergic to a lot of food groups ( shellfish, wheat, peanuts) she has been trying to avoid but struggling with cutting down on gluten . Doing better , one episode of angioedema about one month ago after she had shrimp.    Patient Active Problem List   Diagnosis Date Noted  . Angioedema 07/04/2018  . 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 Chronic constipation 07/15/2015  . History of iron deficiency anemia 07/15/2015  . Migraine without aura and without status migrainosus, not intractable 05/04/2015  . Environmental and seasonal allergies 05/04/2015  . Obesity (BMI 30.0-34.9) 05/04/2015  . Plantar fasciitis of left foot 05/04/2015  . Vitamin D deficiency 05/04/2015  . Snoring 05/04/2015  . GERD (gastroesophageal reflux disease) 05/04/2015    Past Surgical History:  Procedure Laterality Date  . COLPOSCOPY    . ENDOMETRIAL ABLATION    . LEEP    . TUBAL LIGATION      Family History  Problem Relation Age of Onset  . Sleep apnea Mother   . Sleep apnea Father   . Allergic rhinitis Daughter   . Allergic rhinitis Son   . Breast cancer Paternal Grandmother 28  . Brain cancer Paternal Grandmother   . Breast cancer Maternal Aunt 80  . Colon cancer Maternal Uncle   . Lung cancer Maternal  Uncle   . Prostate cancer Maternal Uncle   . Bone cancer Maternal Aunt     Social History   Tobacco Use  . Smoking status: Never Smoker  . Smokeless tobacco: Never Used  Substance Use Topics  . Alcohol use: No    Alcohol/week: 0.0 standard drinks     Current Outpatient Medications:  .  EPINEPHrine 0.3 mg/0.3 mL IJ SOAJ injection, Inject 0.3 mLs (0.3 mg total) into the muscle as needed for anaphylaxis., Disp: 1 each, Rfl: 1 .  fluticasone (FLONASE) 50 MCG/ACT nasal spray, Place 2 sprays into both nostrils daily., Disp: 16 g, Rfl: 6 .  linaclotide (LINZESS) 72 MCG capsule, Take 1 capsule (72 mcg total) by mouth daily. (Patient taking differently: Take 72 mcg by mouth as needed. ), Disp: 30 capsule, Rfl: 2 .  loratadine (CLARITIN) 10 MG tablet, Take by mouth., Disp: , Rfl:  .  Multiple Vitamins-Minerals (MULTIVITAL) tablet, Take by mouth., Disp: , Rfl:  .  omeprazole (PRILOSEC) 40 MG capsule, TAKE 1 CAPSULE (40 MG TOTAL) BY MOUTH DAILY., Disp: 30 capsule, Rfl: 5 .  valACYclovir (VALTREX) 500 MG tablet, TAKE 1 TABLET BY MOUTH TWICE DAILY FOR 3 DAYS AS NEEDED FOR SYMPTOMS, Disp: 30 tablet, Rfl: 1  No Known Allergies  I personally reviewed active problem list, medication list, allergies, family history, social history, health maintenance  with the patient/caregiver today.   ROS  Ten systems reviewed and is negative except as mentioned in HPI   Objective  Vitals:   12/29/19 1659  BP: 118/78  Pulse: 73  Resp: 14  Temp: (!) 97.3 F (36.3 C)  SpO2: 96%  Weight: 185 lb 14.4 oz (84.3 kg)  Height: 5\' 2"  (1.575 m)    Body mass index is 34 kg/m.  Physical Exam  Constitutional: Patient appears well-developed and well-nourished. Obese  No distress.  HEENT: head atraumatic, normocephalic, pupils equal and reactive to light Cardiovascular: Normal rate, regular rhythm and normal heart sounds.  No murmur heard. No BLE edema. Pulmonary/Chest: Effort normal and breath sounds normal.  No respiratory distress. Abdominal: Soft.  There is no tenderness. Psychiatric: Patient has a normal mood and affect. behavior is normal. Judgment and thought content normal. Muscular Skeletal: effusion on left knee, no increase in warmth or redness.   Recent Results (from the past 2160 hour(s))  Surgical pathology     Status: None   Collection Time: 10/26/19  8:40 AM  Result Value Ref Range   SURGICAL PATHOLOGY      SURGICAL PATHOLOGY CASE: [id number] PATIENT: Lori Beltran Surgical Pathology Report     Clinical History: Endocervical polyp (cm)     FINAL MICROSCOPIC DIAGNOSIS:  A. ENDOCERVIX, POLYPECTOMY: - Endocervical-type polyp, benign.   GROSS DESCRIPTION:  Received formalin is a 3.1 x 0.6 x 0.3 cm tan-pink soft polypoid tissue, bisected and submitted in 1 block.  (AK 10/26/2019)    Final Diagnosis performed by Gillie Manners, MD.   Electronically signed 10/27/2019 Technical and / or Professional components performed at [facility], [place] [address] [place], [place] [address].  Immunohistochemistry Technical component (if applicable) was performed at [facility]. [address] [place], [place], [place] [address].   IMMUNOHISTOCHEMISTRY DISCLAIMER (if applicable): Some of these immunohistochemical stains may have been developed and the performance characteristics determine by [facility]. Some may not hav e been cleared or approved by the U.S. Food and Drug Administration. The FDA has determined that such clearance or approval is not necessary. This test is used for clinical purposes. It should not be regarded as investigational or for research. This laboratory is certified under the [employer] (CLIA-88) as qualified to perform high complexity clinical laboratory testing.  The controls stained appropriately.      PHQ2/9: Depression screen [facility] 2/9 12/29/2019  07/09/2019 10/03/2018 07/04/2018 08/29/2017  Decreased Interest 0 0 0 0 1  Down, Depressed, Hopeless 0 0 0 0 1  PHQ - 2 Score 0 0 0 0 2  Altered sleeping 3 0 3 0 1  Tired, decreased energy 3 2 3 1 1   Change in appetite 0 1 0 1 1  Feeling bad or failure about yourself  0 0 0 0 1  Trouble concentrating 0 0 0 0 0  Moving slowly or fidgety/restless 0 0 0 0 0  Suicidal thoughts 0 0 0 0 0  PHQ-9 Score 6 3 6 2 6   Difficult doing work/chores Not difficult at all Not difficult at all Somewhat difficult Not difficult at all Somewhat difficult    phq 9 is negative   Fall Risk: Fall Risk  12/29/2019 07/09/2019 10/03/2018 09/10/2018 07/04/2018  Falls in the past year? 0 0 - 0 No  Number falls in past yr: 0 0 0 - -  Injury with Fall? 0 0 0 - -  Functional Status Survey: Is the patient deaf or have difficulty hearing?: No Does the patient have difficulty seeing, even when wearing glasses/contacts?: No Does the patient have difficulty concentrating, remembering, or making decisions?: No Does the patient have difficulty walking or climbing stairs?: No Does the patient have difficulty dressing or bathing?: No Does the patient have difficulty doing errands alone such as visiting a doctor's office or shopping?: No    Assessment & Plan  1. Effusion of left knee  Consent signed: YES  Procedure: Knee Joint Injection Location: left knee Injection approach: lateral knee Equipment used: 25 gauge 1.5 inch needle Medication: 1 mL Kenalog (400mg /60mL) Anesthesia: 1% Lidocaine w/o Epinephrine Cleaned and prepped: Betadine  The risks, benefits, treatment options discussed with patient prior to procedure.  Consent signed. Area cleansed with sterile betadine. .  Patient tolerated procedure well with no complications and no bleeding. Bandage placed at site of injection. Patient instructed on potential for steroid reaction pain within the initial 24-48hr period. May use ice packs directly on injected  site as needed.  - lidocaine (PF) (XYLOCAINE) 1 % injection 2 mL - triamcinolone acetonide (KENALOG-40) injection 40 mg  2. Angioedema, subsequent encounter  Avoid food

## 2020-01-22 ENCOUNTER — Encounter: Payer: Self-pay | Admitting: Family Medicine

## 2020-01-22 ENCOUNTER — Ambulatory Visit (INDEPENDENT_AMBULATORY_CARE_PROVIDER_SITE_OTHER): Payer: No Typology Code available for payment source | Admitting: Family Medicine

## 2020-01-22 DIAGNOSIS — R490 Dysphonia: Secondary | ICD-10-CM | POA: Diagnosis not present

## 2020-01-22 DIAGNOSIS — R05 Cough: Secondary | ICD-10-CM

## 2020-01-22 DIAGNOSIS — U071 COVID-19: Secondary | ICD-10-CM | POA: Diagnosis not present

## 2020-01-22 DIAGNOSIS — R059 Cough, unspecified: Secondary | ICD-10-CM

## 2020-01-22 MED ORDER — VITAMIN D 125 MCG (5000 UT) PO CAPS: 1.0000 | ORAL_CAPSULE | Freq: Every day | ORAL | 0 refills | Status: DC

## 2020-01-22 MED ORDER — BENZONATATE 100 MG PO CAPS: 100.0000 mg | ORAL_CAPSULE | Freq: Two times a day (BID) | ORAL | 0 refills | Status: DC | PRN

## 2020-01-22 MED ORDER — HYDROCOD POLST-CPM POLST ER 10-8 MG/5ML PO SUER: 5.0000 mL | Freq: Every day | ORAL | 0 refills | Status: DC

## 2020-01-22 MED ORDER — VITAMIN C 1000 MG PO TABS: 1000.0000 mg | ORAL_TABLET | Freq: Every day | ORAL | 0 refills | Status: DC

## 2020-01-22 MED ORDER — ALBUTEROL SULFATE HFA 108 (90 BASE) MCG/ACT IN AERS: 2.0000 | INHALATION_SPRAY | Freq: Four times a day (QID) | RESPIRATORY_TRACT | 0 refills | Status: DC | PRN

## 2020-01-22 NOTE — Progress Notes (Signed)
Name: Lori Beltran   MRN: [id number]    DOB: 1975/11/23   Date:01/22/2020       Progress Note  Subjective  Chief Complaint  Chief Complaint  Patient presents with  . Covid Exposure    Son tested positive on Sunday 01/17/2020. 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   . Fatigue    Tested Positive on Tuesday for COVID.  Marland Kitchen Laryngitis  . Cough    If she takes a deep breath it will make her chest hurt.  . Nasal Congestion    I connected with  Karene Fry  on 01/22/20 at  8:40 AM EDT by a video enabled telemedicine application and verified that I am speaking with the correct person using two identifiers.  I discussed the limitations of evaluation and management by telemedicine and the availability of in person appointments. The patient expressed understanding and agreed to proceed. Staff also discussed with the patient that there may be a patient responsible charge related to this service. Patient Location: at home  Provider Location: [facility]   HPI  COVID-19: exposed to his son that was positive on 01/17/2020, her symptoms started on Saturday 01/16/2020 - some chills in the evening, but she thought she was just tired. She developed a cough on Monday the 29 th, got tested on Tuesday the 30 th, 2021. She states cough is still present but not as severe as yesterday, some sob with activity, fatigue, lack of appettite, hoarseness, initially body aches. She had low grade fever and chills two days ago but resolved since. Denies nausea, vomiting or diarrhea.   She had first COVID vaccine on 01/07/2020   Patient Active Problem List   Diagnosis Date Noted  . Angioedema 07/04/2018  . 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 Chronic constipation 07/15/2015  . History of iron deficiency anemia 07/15/2015  . Migraine without aura and without status migrainosus, not intractable 05/04/2015  . Environmental and  seasonal allergies 05/04/2015  . Obesity (BMI 30.0-34.9) 05/04/2015  . Plantar fasciitis of left foot 05/04/2015  . Vitamin D deficiency 05/04/2015  . Snoring 05/04/2015  . GERD (gastroesophageal reflux disease) 05/04/2015    Past Surgical History:  Procedure Laterality Date  . COLPOSCOPY    . ENDOMETRIAL ABLATION    . LEEP    . TUBAL LIGATION      Family History  Problem Relation Age of Onset  . Sleep apnea Mother   . Sleep apnea Father   . Allergic rhinitis Daughter   . Allergic rhinitis Son   . Breast cancer Paternal Grandmother 56  . Brain cancer Paternal Grandmother   . Breast cancer Maternal Aunt 80  . Colon cancer Maternal Uncle   . Lung cancer Maternal Uncle   . Prostate cancer Maternal Uncle   . Bone cancer Maternal Aunt     Social History   Tobacco Use  . Smoking status: Never Smoker  . Smokeless tobacco: Never Used  Substance Use Topics  . Alcohol use: No    Alcohol/week: 0.0 standard drinks    Current Outpatient Medications:  .  EPINEPHrine 0.3 mg/0.3 mL IJ SOAJ injection, Inject 0.3 mLs (0.3 mg total) into the muscle as needed for anaphylaxis., Disp: 1 each, Rfl: 1 .  fluticasone (FLONASE) 50 MCG/ACT nasal spray, Place 2 sprays into both nostrils daily., Disp: 16 g, Rfl: 6 .  linaclotide (LINZESS) 72 MCG capsule, Take 1 capsule (  72 mcg total) by mouth daily. (Patient taking differently: Take 72 mcg by mouth as needed. ), Disp: 30 capsule, Rfl: 2 .  loratadine (CLARITIN) 10 MG tablet, Take 10 mg by mouth daily as needed. , Disp: , Rfl:  .  Multiple Vitamins-Minerals (MULTIVITAL) tablet, Take by mouth., Disp: , Rfl:  .  omeprazole (PRILOSEC) 40 MG capsule, TAKE 1 CAPSULE (40 MG TOTAL) BY MOUTH DAILY., Disp: 30 capsule, Rfl: 5 .  valACYclovir (VALTREX) 500 MG tablet, TAKE 1 TABLET BY MOUTH TWICE DAILY FOR 3 DAYS AS NEEDED FOR SYMPTOMS, Disp: 30 tablet, Rfl: 1  Current Facility-Administered Medications:  .  lidocaine (PF) (XYLOCAINE) 1 % injection 2 mL, 2  mL, Other, Once, Andera Cranmer, Drue Stager, MD .  triamcinolone acetonide (KENALOG-40) injection 40 mg, 40 mg, Intra-articular, Once, Steele Sizer, MD  Allergies  Allergen Reactions  . Peanut Butter Flavor Itching and Swelling  . Shellfish Allergy Itching and Swelling    I personally reviewed active problem list, medication list, allergies, family history, social history, health maintenance with the patient/caregiver today.   ROS  Ten systems reviewed and is negative except as mentioned in HPI   Objective  Virtual encounter, vitals not obtained.  There is no height or weight on file to calculate BMI.  Physical Exam  Awake, alert and oriented   PHQ2/9: Depression screen [facility] 2/9 01/22/2020 12/29/2019 07/09/2019 10/03/2018 07/04/2018  Decreased Interest 0 0 0 0 0  Down, Depressed, Hopeless 0 0 0 0 0  PHQ - 2 Score 0 0 0 0 0  Altered sleeping 0 3 0 3 0  Tired, decreased energy 0 3 2 3 1   Change in appetite 0 0 1 0 1  Feeling bad or failure about yourself  0 0 0 0 0  Trouble concentrating 0 0 0 0 0  Moving slowly or fidgety/restless 0 0 0 0 0  Suicidal thoughts 0 0 0 0 0  PHQ-9 Score 0 6 3 6 2   Difficult doing work/chores Not difficult at all Not difficult at all Not difficult at all Somewhat difficult Not difficult at all   PHQ-2/9 Result is negative.    Fall Risk: Fall Risk  01/22/2020 12/29/2019 07/09/2019 10/03/2018 09/10/2018  Falls in the past year? 0 0 0 - 0  Number falls in past yr: 0 0 0 0 -  Injury with Fall? 0 0 0 0 -     Assessment & Plan  1. COVID-19  - benzonatate (TESSALON) 100 MG capsule; Take 1-2 capsules (100-200 mg total) by mouth 2 (two) times daily as needed.  Dispense: 100 capsule; Refill: 0 - albuterol (VENTOLIN HFA) 108 (90 Base) MCG/ACT inhaler; Inhale 2 puffs into the lungs every 6 (six) hours as needed for wheezing or shortness of breath.  Dispense: 18 g; Refill: 0 - chlorpheniramine-HYDROcodone (TUSSIONEX PENNKINETIC ER) 10-8 MG/5ML SUER; Take 5 mLs by  mouth daily. Avoid at night time  Dispense: 140 mL; Refill: 0 - Ascorbic Acid (VITAMIN C) 1000 MG tablet; Take 1 tablet (1,000 mg total) by mouth daily.  Dispense: 30 tablet; Refill: 0 - Cholecalciferol (VITAMIN D) 125 MCG (5000 UT) CAPS; Take 1 capsule by mouth daily.  Dispense: 30 capsule; Refill: 0  She called employee health and was advised to stay home until Tuesday April the 6th, explained that if not improving or has a fever we will have to extend her time off   Explained importance of avoiding sedating cough medications at night, to sleep on her stomach or reclined  on a sofa, try to move around the house and stay hydrated. Self isolate.   2. Hoarseness of voice   3. Cough  - benzonatate (TESSALON) 100 MG capsule; Take 1-2 capsules (100-200 mg total) by mouth 2 (two) times daily as needed.  Dispense: 100 capsule; Refill: 0 - albuterol (VENTOLIN HFA) 108 (90 Base) MCG/ACT inhaler; Inhale 2 puffs into the lungs every 6 (six) hours as needed for wheezing or shortness of breath.  Dispense: 18 g; Refill: 0 - chlorpheniramine-HYDROcodone (TUSSIONEX PENNKINETIC ER) 10-8 MG/5ML SUER; Take 5 mLs by mouth daily. Avoid at night time  Dispense: 140 mL; Refill: 0  I discussed the assessment and treatment plan with the patient. The patient was provided an opportunity to ask questions and all were answered. The patient agreed with the plan and demonstrated an understanding of the instructions.  The patient was advised to call back or seek an in-person evaluation if the symptoms worsen or if the condition fails to improve as anticipated.  I provided 15  minutes of non-face-to-face time during this encounter.

## 2020-04-25 ENCOUNTER — Other Ambulatory Visit: Payer: Self-pay | Admitting: Family Medicine

## 2020-04-25 DIAGNOSIS — K219 Gastro-esophageal reflux disease without esophagitis: Secondary | ICD-10-CM

## 2020-04-26 ENCOUNTER — Ambulatory Visit
Admission: RE | Admit: 2020-04-26 | Discharge: 2020-04-26 | Disposition: A | Discharge status: Home / Self Care | Payer: No Typology Code available for payment source | Source: Ambulatory Visit | Attending: Obstetrics and Gynecology | Admitting: Obstetrics and Gynecology

## 2020-04-26 ENCOUNTER — Other Ambulatory Visit: Payer: Self-pay

## 2020-04-26 DIAGNOSIS — Z1231 Encounter for screening mammogram for malignant neoplasm of breast: Secondary | ICD-10-CM | POA: Insufficient documentation

## 2020-04-27 ENCOUNTER — Encounter: Payer: Self-pay | Admitting: Obstetrics and Gynecology

## 2020-07-06 NOTE — Progress Notes (Signed)
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 She has noticed increase in frequency, every weekend for the past month, she takes benadryl prn. Sometimes she add loratadine, she cannot afford going to immunologist. She would like to have labs done ( panel ) , it can happen when eating/cooking bread, meat, she already stopped eating shellfish, her epi-pen is about to expire  Chronic constipation: doing well at this time, only takes Linzess prn   GERD: she is taking Omeprazole prn only, currently not having heart burn or indigestion  Hyperglycemia: she has been eating more desserts lately, she denies polyphagia, polydipsia or polyuria.   Obesity: she states she will try to cut down on desserts, she has not been very physically active. She asked about medication for weight loss. Explained BMI below 35 and needs to check what covered by insurance. She has pain on right knee, GERD and also pre-diabetes. She try phentermine before her wedding and it really helped her lose weight. Contrave caused nausea and could not tolerate it. She has been trying portion control and drinking water.   .  Patient Active Problem List   Diagnosis Date Noted  . Angioedema 07/04/2018  . 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 Chronic constipation 07/15/2015  . History of iron deficiency anemia 07/15/2015  . Migraine without aura and without status migrainosus, not intractable 05/04/2015  . Environmental and seasonal allergies 05/04/2015  . Obesity (BMI 30.0-34.9) 05/04/2015  . Plantar fasciitis of left foot 05/04/2015  . Vitamin D deficiency 05/04/2015  . Snoring 05/04/2015  . GERD (gastroesophageal reflux disease) 05/04/2015    Past Surgical History:   Procedure Laterality Date  . COLPOSCOPY    . ENDOMETRIAL ABLATION    . LEEP    . TUBAL LIGATION      Family History  Problem Relation Age of Onset  . Sleep apnea Mother   . Sleep apnea Father   . Allergic rhinitis Daughter   . Allergic rhinitis Son   . Breast cancer Paternal Grandmother 30  . Brain cancer Paternal Grandmother   . Breast cancer Maternal Aunt 80  . Colon cancer Maternal Uncle   . Lung cancer Maternal Uncle   . Prostate cancer Maternal Uncle   . Bone cancer Maternal Aunt     Social History   Tobacco Use  . Smoking status: Never Smoker  . Smokeless tobacco: Never Used  Substance Use Topics  . Alcohol use: No    Alcohol/week: 0.0 standard drinks     Current Outpatient Medications:  .  Ascorbic Acid (VITAMIN C) 1000 MG tablet, Take 1 tablet (1,000 mg total) by mouth daily., Disp: 30 tablet, Rfl: 0 .  Cholecalciferol (VITAMIN D) 125 MCG (5000 UT) CAPS, Take 1 capsule by mouth daily., Disp: 30 capsule, Rfl: 0 .  EPINEPHrine 0.3 mg/0.3 mL IJ SOAJ injection, Inject 0.3 mg into the muscle as needed for anaphylaxis., Disp: 1 each, Rfl: 1 .  fluticasone (FLONASE) 50 MCG/ACT nasal spray, Place 2 sprays into both nostrils daily., Disp: 16 g, Rfl: 6 .  linaclotide (LINZESS) 72 MCG capsule, Take 1 capsule (72 mcg total) by mouth daily. (Patient taking differently: Take 72 mcg by mouth as needed. ), Disp:  30 capsule, Rfl: 2 .  loratadine (CLARITIN) 10 MG tablet, Take 10 mg by mouth daily as needed. , Disp: , Rfl:  .  Multiple Vitamins-Minerals (MULTIVITAL) tablet, Take by mouth., Disp: , Rfl:  .  omeprazole (PRILOSEC) 40 MG capsule, TAKE 1 CAPSULE (40 MG TOTAL) BY MOUTH DAILY., Disp: 30 capsule, Rfl: 5 .  valACYclovir (VALTREX) 500 MG tablet, TAKE 1 TABLET BY MOUTH TWICE DAILY FOR 3 DAYS AS NEEDED FOR SYMPTOMS, Disp: 30 tablet, Rfl: 1  Allergies  Allergen Reactions  . Peanut Butter Flavor Itching and Swelling  . Shellfish Allergy Itching and Swelling    I personally  reviewed active problem list, medication list, allergies, family history, social history, health maintenance with the patient/caregiver today.   ROS  Ten systems reviewed and is negative except as mentioned in HPI   Objective  Vitals:   07/07/20 0944  BP: 120/80  Pulse: 76  Resp: 16  Temp: 98.3 F (36.8 C)  TempSrc: Oral  SpO2: 100%  Weight: 184 lb (83.5 kg)  Height: 5\' 2"  (1.575 m)    Body mass index is 33.65 kg/m.  Physical Exam  Constitutional: Patient appears well-developed and well-nourished. Obese  No distress.  HEENT: head atraumatic, normocephalic, pupils equal and reactive to light, , neck supple Cardiovascular: Normal rate, regular rhythm and normal heart sounds.  No murmur heard. No BLE edema. Pulmonary/Chest: Effort normal and breath sounds normal. No respiratory distress. Abdominal: Soft.  There is no tenderness. Psychiatric: Patient has a normal mood and affect. behavior is normal. Judgment and thought content normal.   PHQ2/9: Depression screen [facility] 2/9 01/22/2020 12/29/2019 07/09/2019 10/03/2018 07/04/2018  Decreased Interest 0 0 0 0 0  Down, Depressed, Hopeless 0 0 0 0 0  PHQ - 2 Score 0 0 0 0 0  Altered sleeping 0 3 0 3 0  Tired, decreased energy 0 3 2 3 1   Change in appetite 0 0 1 0 1  Feeling bad or failure about yourself  0 0 0 0 0  Trouble concentrating 0 0 0 0 0  Moving slowly or fidgety/restless 0 0 0 0 0  Suicidal thoughts 0 0 0 0 0  PHQ-9 Score 0 6 3 6 2   Difficult doing work/chores Not difficult at all Not difficult at all Not difficult at all Somewhat difficult Not difficult at all    phq 9 is negative  Fall Risk: Fall Risk  01/22/2020 12/29/2019 07/09/2019 10/03/2018 09/10/2018  Falls in the past year? 0 0 0 - 0  Number falls in past yr: 0 0 0 0 -  Injury with Fall? 0 0 0 0 -    Assessment & Plan  1. Angioedema, subsequent encounter  - Food Allergy Profile - EPINEPHrine 0.3 mg/0.3 mL IJ SOAJ injection; Inject 0.3 mg into the muscle as  needed for anaphylaxis.  Dispense: 1 each; Refill: 1 - CBC with Differential/Platelet  2. Hyperglycemia  - Hemoglobin A1c - COMPLETE METABOLIC PANEL WITH GFR  3. Gastroesophageal reflux disease without esophagitis  - COMPLETE METABOLIC PANEL WITH GFR  4. Lipid screening  - Lipid panel  5. Long-term use of high-risk medication  - COMPLETE METABOLIC PANEL WITH GFR

## 2020-07-07 ENCOUNTER — Other Ambulatory Visit: Payer: Self-pay

## 2020-07-07 ENCOUNTER — Ambulatory Visit (INDEPENDENT_AMBULATORY_CARE_PROVIDER_SITE_OTHER): Payer: No Typology Code available for payment source | Admitting: Family Medicine

## 2020-07-07 ENCOUNTER — Encounter: Payer: Self-pay | Admitting: Family Medicine

## 2020-07-07 VITALS — BP 120/80 | HR 76 | Temp 98.3°F | Resp 16 | Ht 62.0 in | Wt 184.0 lb

## 2020-07-07 DIAGNOSIS — T783XXD Angioneurotic edema, subsequent encounter: Secondary | ICD-10-CM

## 2020-07-07 DIAGNOSIS — K219 Gastro-esophageal reflux disease without esophagitis: Secondary | ICD-10-CM | POA: Diagnosis not present

## 2020-07-07 DIAGNOSIS — R739 Hyperglycemia, unspecified: Secondary | ICD-10-CM

## 2020-07-07 DIAGNOSIS — Z79899 Other long term (current) drug therapy: Secondary | ICD-10-CM

## 2020-07-07 DIAGNOSIS — E669 Obesity, unspecified: Secondary | ICD-10-CM

## 2020-07-07 DIAGNOSIS — Z13220 Encounter for screening for lipoid disorders: Secondary | ICD-10-CM

## 2020-07-07 MED ORDER — EPINEPHRINE 0.3 MG/0.3ML IJ SOAJ: 0.3000 mg | INTRAMUSCULAR | 1 refills | Status: DC | PRN

## 2020-07-07 NOTE — Patient Instructions (Signed)
Call insurance, explain your BMI is 33.65, can you take medications Qsymia, Kirke Shaggy are the options.  Explained it will help with GERD, knee pain and pre-diabetes

## 2020-07-11 LAB — FOOD ALLERGY PROFILE
Allergen, Salmon, f41: <0.1 kU/L
Almonds: <0.1 kU/L
CLASS: 0
CLASS: 0
CLASS: 0
CLASS: 0
CLASS: 0
CLASS: 0
CLASS: 0
CLASS: 0
Cashew IgE: <0.1 kU/L
Class: 1
Egg White IgE: 0.11 kU/L — ABNORMAL HIGH
Fish Cod: <0.1 kU/L
Hazelnut: 0.32 kU/L — ABNORMAL HIGH
Milk IgE: 0.11 kU/L — ABNORMAL HIGH
Peanut IgE: 0.33 kU/L — ABNORMAL HIGH
Scallop IgE: <0.1 kU/L
Sesame Seed f10: 0.19 kU/L — ABNORMAL HIGH
Shrimp IgE: 0.43 kU/L — ABNORMAL HIGH
Soybean IgE: <0.1 kU/L
Tuna IgE: <0.1 kU/L
Walnut: <0.1 kU/L
Wheat IgE: 0.17 kU/L — ABNORMAL HIGH

## 2020-07-11 LAB — COMPLETE METABOLIC PANEL WITH GFR
AG Ratio: 1.6 (calc) (ref 1.0–2.5)
ALT: 10 U/L (ref 6–29)
AST: 10 U/L (ref 10–35)
Albumin: 4.1 g/dL (ref 3.6–5.1)
Alkaline phosphatase (APISO): 78 U/L (ref 31–125)
BUN: 10 mg/dL (ref 7–25)
CO2: 25 mmol/L (ref 20–32)
Calcium: 9.6 mg/dL (ref 8.6–10.2)
Chloride: 105 mmol/L (ref 98–110)
Creat: 0.71 mg/dL (ref 0.50–1.10)
GFR, Est African American: 118 mL/min/{1.73_m2} (ref >=60)
GFR, Est Non African American: 102 mL/min/{1.73_m2} (ref >=60)
Globulin: 2.5 g/dL (calc) (ref 1.9–3.7)
Glucose, Bld: 98 mg/dL (ref 65–99)
Potassium: 4.4 mmol/L (ref 3.5–5.3)
Sodium: 140 mmol/L (ref 135–146)
Total Bilirubin: 0.4 mg/dL (ref 0.2–1.2)
Total Protein: 6.6 g/dL (ref 6.1–8.1)

## 2020-07-11 LAB — LIPID PANEL
Cholesterol: 169 mg/dL (ref <200)
HDL: 45 mg/dL — ABNORMAL LOW (ref >=50)
LDL Cholesterol (Calc): 103 mg/dL (calc) — ABNORMAL HIGH
Non-HDL Cholesterol (Calc): 124 mg/dL (calc) (ref <130)
Total CHOL/HDL Ratio: 3.8 (calc) (ref <5.0)
Triglycerides: 109 mg/dL (ref <150)

## 2020-07-11 LAB — CBC WITH DIFFERENTIAL/PLATELET
Absolute Monocytes: 449 cells/uL (ref 200–950)
Basophils Absolute: 91 cells/uL (ref 0–200)
Basophils Relative: 1.4 %
Eosinophils Absolute: 150 cells/uL (ref 15–500)
Eosinophils Relative: 2.3 %
HCT: 40 % (ref 35.0–45.0)
Hemoglobin: 12.8 g/dL (ref 11.7–15.5)
Lymphs Abs: 2256 cells/uL (ref 850–3900)
MCH: 27.6 pg (ref 27.0–33.0)
MCHC: 32 g/dL (ref 32.0–36.0)
MCV: 86.4 fL (ref 80.0–100.0)
MPV: 10.2 fL (ref 7.5–12.5)
Monocytes Relative: 6.9 %
Neutro Abs: 3556 cells/uL (ref 1500–7800)
Neutrophils Relative %: 54.7 %
Platelets: 416 10*3/uL — ABNORMAL HIGH (ref 140–400)
RBC: 4.63 10*6/uL (ref 3.80–5.10)
RDW: 13.6 % (ref 11.0–15.0)
Total Lymphocyte: 34.7 %
WBC: 6.5 10*3/uL (ref 3.8–10.8)

## 2020-07-11 LAB — INTERPRETATION:

## 2020-07-11 LAB — HEMOGLOBIN A1C
Hgb A1c MFr Bld: 6 % of total Hgb — ABNORMAL HIGH (ref <5.7)
Mean Plasma Glucose: 126 (calc)
eAG (mmol/L): 7 (calc)

## 2020-07-14 ENCOUNTER — Other Ambulatory Visit: Payer: Self-pay | Admitting: Family Medicine

## 2020-07-14 DIAGNOSIS — K9041 Non-celiac gluten sensitivity: Secondary | ICD-10-CM

## 2020-07-15 LAB — CELIAC DISEASE PANEL
(tTG) Ab, IgA: <1 U/mL
(tTG) Ab, IgG: <1 U/mL
Gliadin IgA: 1.1 U/mL
Gliadin IgG: <1 U/mL
Immunoglobulin A: 102 mg/dL (ref 47–310)

## 2020-11-17 ENCOUNTER — Ambulatory Visit: Payer: No Typology Code available for payment source | Admitting: Obstetrics and Gynecology

## 2020-11-21 ENCOUNTER — Ambulatory Visit (INDEPENDENT_AMBULATORY_CARE_PROVIDER_SITE_OTHER): Payer: No Typology Code available for payment source | Admitting: Obstetrics and Gynecology

## 2020-11-21 ENCOUNTER — Encounter: Payer: Self-pay | Admitting: Obstetrics and Gynecology

## 2020-11-21 ENCOUNTER — Other Ambulatory Visit: Payer: Self-pay

## 2020-11-21 VITALS — BP 104/80 | Ht 62.0 in | Wt 179.0 lb

## 2020-11-21 DIAGNOSIS — Z1211 Encounter for screening for malignant neoplasm of colon: Secondary | ICD-10-CM

## 2020-11-21 DIAGNOSIS — A6004 Herpesviral vulvovaginitis: Secondary | ICD-10-CM

## 2020-11-21 DIAGNOSIS — Z1231 Encounter for screening mammogram for malignant neoplasm of breast: Secondary | ICD-10-CM

## 2020-11-21 DIAGNOSIS — Z01419 Encounter for gynecological examination (general) (routine) without abnormal findings: Secondary | ICD-10-CM | POA: Diagnosis not present

## 2020-11-21 NOTE — Patient Instructions (Signed)
I value your feedback and you entrusting us with your care. If you get a [facility] patient survey, I would appreciate you taking the time to let us know about your experience today. Thank you!  [facility]: [phone]      

## 2020-11-21 NOTE — Progress Notes (Signed)
PCP:  Steele Sizer, MD   Chief Complaint  Patient presents with  . Gynecologic Exam    No concerns     HPI:      [name] is a 45 y.o. No obstetric history on file. who LMP was No LMP recorded. Patient has had an ablation., presents today for her annual examination.  Her menses are amenorrheic, s/p ablation. Had light spotting occas lastyr.  Sex activity: single partner, contraception - tubal ligation. S/p endocx polyp removal last yr due to bleeding with sex. Sx resolved. Still has occas pelvic tenderness during sex but doesn't correlate with bleeding; no dryness.  Last Pap: 10/23/18  Results were: no abnormalities /neg HPV DNA Hx of STDs: HSV, takes valtrex prn--doesn't need Rx RF  Last mammo: 04/26/20 Results: no abnormalities, repeat in 12 months There is a FH of breast cancer in her mat aunt and PGM, genetic testing not indicated. There is no FH of ovarian cancer. The patient does do self-breast exams.  Tobacco use: The patient denies current or previous tobacco use. Alcohol use: none No drug use.  Exercise: mod active  Colonoscopy: never  She does get adequate calcium and Vitamin D in her diet.  Labs with PCP.    Past Medical History:  Diagnosis Date  . Arthritis    left knee  . Cervical dysplasia    CINII  . Constipation   . Herpes   . Migraine   . Obesity   . Pap smear abnormality of vagina with ASC-US   . Plantar fasciitis   . Snoring   . Vitamin D deficiency     Past Surgical History:  Procedure Laterality Date  . COLPOSCOPY    . ENDOMETRIAL ABLATION    . LEEP    . TUBAL LIGATION      Family History  Problem Relation Age of Onset  . Sleep apnea Mother   . Sleep apnea Father   . Allergic rhinitis Daughter   . Allergic rhinitis Son   . Breast cancer Paternal Grandmother 13  . Brain cancer Paternal Grandmother   . Breast cancer Maternal Aunt 80  . Colon cancer Maternal Uncle   . Lung cancer Maternal Uncle   . Prostate cancer  Maternal Uncle   . Bone cancer Maternal Aunt     Social History   Socioeconomic History  . Marital status: Married    Spouse name: Gerald Stabs   . Number of children: 3  . Years of education: Not on file  . Highest education level: Associate degree: occupational, Hotel manager, or vocational program  Occupational History  . Not on file  Tobacco Use  . Smoking status: Never Smoker  . Smokeless tobacco: Never Used  Vaping Use  . Vaping Use: Never used  Substance and Sexual Activity  . Alcohol use: No    Alcohol/week: 0.0 standard drinks  . Drug use: No  . Sexual activity: Yes    Partners: Male    Birth control/protection: Other-see comments, Surgical    Comment: TL  Other Topics Concern  . Not on file  Social History Narrative  . Not on file   Social Determinants of Health   Financial Resource Strain: Not on file  Food Insecurity: Not on file  Transportation Needs: Not on file  Physical Activity: Not on file  Stress: Not on file  Social Connections: Not on file  Intimate Partner Violence: Not on file    Current Meds  Medication Sig  . Ascorbic Acid (  VITAMIN C) 1000 MG tablet Take 1 tablet (1,000 mg total) by mouth daily.  . Cholecalciferol (VITAMIN D) 125 MCG (5000 UT) CAPS Take 1 capsule by mouth daily.  Marland Kitchen EPINEPHrine 0.3 mg/0.3 mL IJ SOAJ injection Inject 0.3 mg into the muscle as needed for anaphylaxis.  . fluticasone (FLONASE) 50 MCG/ACT nasal spray Place 2 sprays into both nostrils daily.  Marland Kitchen linaclotide (LINZESS) 72 MCG capsule Take 1 capsule (72 mcg total) by mouth daily. (Patient taking differently: Take 72 mcg by mouth as needed.)  . loratadine (CLARITIN) 10 MG tablet Take 10 mg by mouth daily as needed.   . Multiple Vitamins-Minerals (MULTIVITAL) tablet Take by mouth.  Marland Kitchen omeprazole (PRILOSEC) 40 MG capsule TAKE 1 CAPSULE (40 MG TOTAL) BY MOUTH DAILY.  . valACYclovir (VALTREX) 500 MG tablet TAKE 1 TABLET BY MOUTH TWICE DAILY FOR 3 DAYS AS NEEDED FOR SYMPTOMS      ROS:  Review of Systems  Constitutional: Negative for fatigue, fever and unexpected weight change.  Respiratory: Negative for cough, shortness of breath and wheezing.   Cardiovascular: Negative for chest pain, palpitations and leg swelling.  Gastrointestinal: Negative for blood in stool, constipation, diarrhea, nausea and vomiting.  Endocrine: Negative for cold intolerance, heat intolerance and polyuria.  Genitourinary: Negative for dyspareunia, dysuria, flank pain, frequency, genital sores, hematuria, menstrual problem, pelvic pain, urgency, vaginal bleeding, vaginal discharge and vaginal pain.  Musculoskeletal: Negative for back pain, joint swelling and myalgias.  Skin: Negative for rash.  Neurological: Negative for dizziness, syncope, light-headedness, numbness and headaches.  Hematological: Negative for adenopathy.  Psychiatric/Behavioral: Negative for agitation, confusion, sleep disturbance and suicidal ideas. The patient is not nervous/anxious.      Objective: BP 104/80   Ht 5\' 2"  (1.575 m)   Wt 179 lb (81.2 kg)   BMI 32.74 kg/m   Physical Exam Constitutional:      Appearance: She is well-developed.  Genitourinary:     Vulva normal.     Right Labia: No rash, tenderness or lesions.    Left Labia: No tenderness, lesions or rash.    No vaginal discharge, erythema or tenderness.      Right Adnexa: not tender and no mass present.    Left Adnexa: not tender and no mass present.    No cervical friability or polyp.     Uterus is not enlarged or tender.  Breasts:     Right: No mass, nipple discharge, skin change or tenderness.     Left: No mass, nipple discharge, skin change or tenderness.    Neck:     Thyroid: No thyromegaly.  Cardiovascular:     Rate and Rhythm: Normal rate and regular rhythm.     Heart sounds: Normal heart sounds. No murmur heard.   Pulmonary:     Effort: Pulmonary effort is normal.     Breath sounds: Normal breath sounds.  Abdominal:      Palpations: Abdomen is soft.     Tenderness: There is no abdominal tenderness. There is no guarding or rebound.  Musculoskeletal:        General: Normal range of motion.     Cervical back: Normal range of motion.  Lymphadenopathy:     Cervical: No cervical adenopathy.  Neurological:     General: No focal deficit present.     Mental Status: She is alert and oriented to person, place, and time.     Cranial Nerves: No cranial nerve deficit.  Skin:    General: Skin is warm and  dry.  Psychiatric:        Mood and Affect: Mood normal.        Behavior: Behavior normal.        Thought Content: Thought content normal.        Judgment: Judgment normal.  Vitals reviewed.     Assessment/Plan: Encounter for annual routine gynecological examination  Encounter for screening mammogram for malignant neoplasm of breast - Plan: MM 3D SCREEN BREAST BILATERAL; pt to sched mammo 7/22  Screening for colon cancer - Plan: Ambulatory referral to Gastroenterology; colonoscopy/cologuard discussed. Pt elects colonoscopy. Ref sent.   Herpes simplex vulvovaginitis--will call for Rx RF prn.         GYN counsel breast self exam, mammography screening, adequate intake of calcium and vitamin D, diet and exercise     F/U  Return in about 1 year (around 11/21/2021).  Ersel Wadleigh B. Schawn Byas, PA-C 11/21/2020 2:05 PM

## 2020-11-24 ENCOUNTER — Other Ambulatory Visit: Payer: Self-pay

## 2020-11-24 ENCOUNTER — Other Ambulatory Visit: Payer: Self-pay | Admitting: Gastroenterology

## 2020-11-24 ENCOUNTER — Telehealth (INDEPENDENT_AMBULATORY_CARE_PROVIDER_SITE_OTHER): Payer: Self-pay | Admitting: Gastroenterology

## 2020-11-24 DIAGNOSIS — Z1211 Encounter for screening for malignant neoplasm of colon: Secondary | ICD-10-CM

## 2020-11-24 MED ORDER — NA SULFATE-K SULFATE-MG SULF 17.5-3.13-1.6 GM/177ML PO SOLN: 1.0000 | Freq: Once | ORAL | 0 refills | Status: AC

## 2020-11-24 NOTE — Progress Notes (Signed)
Gastroenterology Pre-Procedure Review  Request Date: Friday 12/30/20 Requesting Physician: Dr. Vicente Males  PATIENT REVIEW QUESTIONS: The patient responded to the following health history questions as indicated:    1. Are you having any GI issues? no 2. Do you have a personal history of Polyps? no 3. Do you have a family history of Colon Cancer or Polyps? unsure 4. Diabetes Mellitus? no 5. Joint replacements in the past 12 months?no 6. Major health problems in the past 3 months?no 7. Any artificial heart valves, MVP, or defibrillator?no    MEDICATIONS & ALLERGIES:    Patient reports the following regarding taking any anticoagulation/antiplatelet therapy:   Plavix, Coumadin, Eliquis, Xarelto, Lovenox, Pradaxa, Brilinta, or Effient? no Aspirin? no  Patient confirms/reports the following medications:  Current Outpatient Medications  Medication Sig Dispense Refill  . Ascorbic Acid (VITAMIN C) 1000 MG tablet Take 1 tablet (1,000 mg total) by mouth daily. 30 tablet 0  . Cholecalciferol (VITAMIN D) 125 MCG (5000 UT) CAPS Take 1 capsule by mouth daily. 30 capsule 0  . EPINEPHrine 0.3 mg/0.3 mL IJ SOAJ injection Inject 0.3 mg into the muscle as needed for anaphylaxis. 1 each 1  . fluticasone (FLONASE) 50 MCG/ACT nasal spray Place 2 sprays into both nostrils daily. 16 g 6  . linaclotide (LINZESS) 72 MCG capsule Take 1 capsule (72 mcg total) by mouth daily. (Patient taking differently: Take 72 mcg by mouth as needed.) 30 capsule 2  . loratadine (CLARITIN) 10 MG tablet Take 10 mg by mouth daily as needed.     . Multiple Vitamins-Minerals (MULTIVITAL) tablet Take by mouth.    Marland Kitchen omeprazole (PRILOSEC) 40 MG capsule TAKE 1 CAPSULE (40 MG TOTAL) BY MOUTH DAILY. 30 capsule 5  . valACYclovir (VALTREX) 500 MG tablet TAKE 1 TABLET BY MOUTH TWICE DAILY FOR 3 DAYS AS NEEDED FOR SYMPTOMS 30 tablet 1   No current facility-administered medications for this visit.    Patient confirms/reports the following  allergies:  Allergies  Allergen Reactions  . Peanut Butter Flavor Itching and Swelling  . Shellfish Allergy Itching and Swelling    No orders of the defined types were placed in this encounter.   AUTHORIZATION INFORMATION Primary Insurance: 1D#: Group #:  Secondary Insurance: 1D#: Group #:  SCHEDULE INFORMATION: Date: 12/30/20 Time: Location:[facility]

## 2020-12-28 ENCOUNTER — Other Ambulatory Visit: Payer: Self-pay

## 2020-12-28 ENCOUNTER — Other Ambulatory Visit
Admission: RE | Admit: 2020-12-28 | Discharge: 2020-12-28 | Disposition: A | Discharge status: Home / Self Care | Payer: No Typology Code available for payment source | Source: Ambulatory Visit | Attending: Gastroenterology | Admitting: Gastroenterology

## 2020-12-28 DIAGNOSIS — Z20822 Contact with and (suspected) exposure to covid-19: Secondary | ICD-10-CM | POA: Insufficient documentation

## 2020-12-28 DIAGNOSIS — Z01812 Encounter for preprocedural laboratory examination: Secondary | ICD-10-CM | POA: Insufficient documentation

## 2020-12-28 LAB — SARS CORONAVIRUS 2 (TAT 6-24 HRS): SARS Coronavirus 2: NEGATIVE

## 2020-12-29 ENCOUNTER — Encounter: Payer: Self-pay | Admitting: Gastroenterology

## 2020-12-30 ENCOUNTER — Ambulatory Visit: Payer: No Typology Code available for payment source | Admitting: Certified Registered Nurse Anesthetist

## 2020-12-30 ENCOUNTER — Encounter: Payer: Self-pay | Admitting: Gastroenterology

## 2020-12-30 ENCOUNTER — Encounter
Admission: RE | Disposition: A | Discharge status: Home / Self Care | Payer: Self-pay | Source: Ambulatory Visit | Attending: Gastroenterology

## 2020-12-30 ENCOUNTER — Ambulatory Visit
Admission: RE | Admit: 2020-12-30 | Discharge: 2020-12-30 | Disposition: A | Discharge status: Home / Self Care | Payer: No Typology Code available for payment source | Source: Ambulatory Visit | Attending: Gastroenterology | Admitting: Gastroenterology

## 2020-12-30 ENCOUNTER — Other Ambulatory Visit: Payer: Self-pay

## 2020-12-30 DIAGNOSIS — Z79899 Other long term (current) drug therapy: Secondary | ICD-10-CM | POA: Diagnosis not present

## 2020-12-30 DIAGNOSIS — Z1211 Encounter for screening for malignant neoplasm of colon: Secondary | ICD-10-CM | POA: Diagnosis not present

## 2020-12-30 DIAGNOSIS — Z91010 Allergy to peanuts: Secondary | ICD-10-CM | POA: Insufficient documentation

## 2020-12-30 DIAGNOSIS — Z91013 Allergy to seafood: Secondary | ICD-10-CM | POA: Insufficient documentation

## 2020-12-30 HISTORY — PX: COLONOSCOPY WITH PROPOFOL: SHX5780

## 2020-12-30 HISTORY — DX: Sleep apnea, unspecified: G47.30

## 2020-12-30 LAB — POCT PREGNANCY, URINE: Preg Test, Ur: NEGATIVE

## 2020-12-30 SURGERY — COLONOSCOPY WITH PROPOFOL
Anesthesia: General

## 2020-12-30 MED ORDER — PHENYLEPHRINE HCL (PRESSORS) 10 MG/ML IV SOLN
INTRAVENOUS | Status: AC
  Filled 2020-12-30: qty 1

## 2020-12-30 MED ORDER — SODIUM CHLORIDE 0.9 % IV SOLN: INTRAVENOUS | Status: DC

## 2020-12-30 MED ORDER — GLYCOPYRROLATE 0.2 MG/ML IJ SOLN
INTRAMUSCULAR | Status: AC
  Filled 2020-12-30: qty 1

## 2020-12-30 MED ORDER — LIDOCAINE HCL (PF) 2 % IJ SOLN
INTRAMUSCULAR | Status: AC
  Filled 2020-12-30: qty 5

## 2020-12-30 MED ORDER — PROPOFOL 500 MG/50ML IV EMUL
INTRAVENOUS | Status: DC | PRN
  Administered 2020-12-30: 170 ug/kg/min via INTRAVENOUS

## 2020-12-30 MED ORDER — PROPOFOL 500 MG/50ML IV EMUL
INTRAVENOUS | Status: AC
  Filled 2020-12-30: qty 200

## 2020-12-30 MED ORDER — PROPOFOL 10 MG/ML IV BOLUS
INTRAVENOUS | Status: DC | PRN
  Administered 2020-12-30: 70 mg via INTRAVENOUS

## 2020-12-30 NOTE — Anesthesia Preprocedure Evaluation (Signed)
Anesthesia Evaluation  Patient identified by MRN, date of birth, ID band Patient awake    Reviewed: Allergy & Precautions, NPO status , Patient's Chart, lab work & pertinent test results  Airway Mallampati: II  TM Distance: >3 FB     Dental  (+) Teeth Intact   Pulmonary neg pulmonary ROS,    Pulmonary exam normal        Cardiovascular negative cardio ROS Normal cardiovascular exam     Neuro/Psych  Headaches, negative psych ROS   GI/Hepatic Neg liver ROS, GERD  Medicated,  Endo/Other  negative endocrine ROS  Renal/GU negative Renal ROS  negative genitourinary   Musculoskeletal  (+) Arthritis ,   Abdominal Normal abdominal exam  (+)   Peds negative pediatric ROS (+)  Hematology negative hematology ROS (+)   Anesthesia Other Findings Past Medical History: No date: Arthritis     Comment:  left knee No date: Cervical dysplasia     Comment:  CINII No date: Constipation No date: Herpes No date: Migraine No date: Obesity No date: Pap smear abnormality of vagina with ASC-US No date: Plantar fasciitis No date: Snoring No date: Vitamin D deficiency  Reproductive/Obstetrics                             Anesthesia Physical Anesthesia Plan  ASA: II  Anesthesia Plan: General   Post-op Pain Management:    Induction: Intravenous  PONV Risk Score and Plan: Propofol infusion  Airway Management Planned: Nasal Cannula  Additional Equipment:   Intra-op Plan:   Post-operative Plan:   Informed Consent: I have reviewed the patients History and Physical, chart, labs and discussed the procedure including the risks, benefits and alternatives for the proposed anesthesia with the patient or authorized representative who has indicated his/her understanding and acceptance.       Plan Discussed with: CRNA and Surgeon  Anesthesia Plan Comments:         Anesthesia Quick Evaluation

## 2020-12-30 NOTE — H&P (Signed)
Jonathon Bellows, MD [address] [place], [place], [place], [address] [address], [place], [place], [place], [address] Phone: [phone]  Fax: [phone]  Primary Care Physician:  Steele Sizer, MD   Pre-Procedure History & Physical: HPI:  Demetris Meinhardt is a 45 y.o. female is here for an colonoscopy.   Past Medical History:  Diagnosis Date  . Arthritis    left knee  . Cervical dysplasia    CINII  . Constipation   . Herpes   . Migraine   . Obesity   . Pap smear abnormality of vagina with ASC-US   . Plantar fasciitis   . Sleep apnea   . Snoring   . Vitamin D deficiency     Past Surgical History:  Procedure Laterality Date  . COLPOSCOPY    . ENDOMETRIAL ABLATION    . LEEP    . TUBAL LIGATION      Prior to Admission medications   Medication Sig Start Date End Date Taking? Authorizing Provider  loratadine (CLARITIN) 10 MG tablet Take 10 mg by mouth daily as needed.  09/21/14  Yes [provider]  Multiple Vitamins-Minerals (MULTIVITAL) tablet Take by mouth.   Yes [provider]  omeprazole (PRILOSEC) 40 MG capsule TAKE 1 CAPSULE (40 MG TOTAL) BY MOUTH DAILY. 04/25/20  Yes Sowles, Drue Stager, MD  valACYclovir (VALTREX) 500 MG tablet TAKE 1 TABLET BY MOUTH TWICE DAILY FOR 3 DAYS AS NEEDED FOR SYMPTOMS 01/25/79  Yes Copland, Deirdre Evener, PA-C  Ascorbic Acid (VITAMIN C) 1000 MG tablet Take 1 tablet (1,000 mg total) by mouth daily. Patient not taking: Reported on 12/30/2020 01/22/20   Steele Sizer, MD  Cholecalciferol (VITAMIN D) 125 MCG (5000 UT) CAPS Take 1 capsule by mouth daily. Patient not taking: Reported on 12/30/2020 01/22/20   Steele Sizer, MD  EPINEPHrine 0.3 mg/0.3 mL IJ SOAJ injection Inject 0.3 mg into the muscle as needed for anaphylaxis. 07/07/20   Steele Sizer, MD  fluticasone (FLONASE) 50 MCG/ACT nasal spray Place 2 sprays into both nostrils daily. 10/17/18   Steele Sizer, MD  linaclotide Rolan Lipa) 72 MCG capsule Take 1 capsule  (72 mcg total) by mouth daily. Patient taking differently: Take 72 mcg by mouth as needed. 04/30/16   Steele Sizer, MD    Allergies as of 11/24/2020 - Review Complete 11/24/2020  Allergen Reaction Noted  . Peanut butter flavor Itching and Swelling 01/22/2020  . Shellfish allergy Itching and Swelling 01/22/2020    Family History  Problem Relation Age of Onset  . Sleep apnea Mother   . Sleep apnea Father   . Allergic rhinitis Daughter   . Allergic rhinitis Son   . Breast cancer Paternal Grandmother 77  . Brain cancer Paternal Grandmother   . Breast cancer Maternal Aunt 80  . Colon cancer Maternal Uncle   . Lung cancer Maternal Uncle   . Prostate cancer Maternal Uncle   . Bone cancer Maternal Aunt     Social History   Socioeconomic History  . Marital status: Married    Spouse name: Gerald Stabs   . Number of children: 3  . Years of education: Not on file  . Highest education level: Associate degree: occupational, Hotel manager, or vocational program  Occupational History  . Not on file  Tobacco Use  . Smoking status: Never Smoker  . Smokeless tobacco: Never Used  Vaping Use  . Vaping Use: Never used  Substance and Sexual Activity  . Alcohol use: No    Alcohol/week:  0.0 standard drinks  . Drug use: No  . Sexual activity: Yes    Partners: Male    Birth control/protection: Other-see comments, Surgical    Comment: TL  Other Topics Concern  . Not on file  Social History Narrative  . Not on file   Social Determinants of Health   Financial Resource Strain: Not on file  Food Insecurity: Not on file  Transportation Needs: Not on file  Physical Activity: Not on file  Stress: Not on file  Social Connections: Not on file  Intimate Partner Violence: Not on file    Review of Systems: See HPI, otherwise negative ROS  Physical Exam: BP 113/89   Pulse 74   Temp (!) 96.6 F (35.9 C) (Temporal)   Resp 20   Ht 5\' 2"  (1.575 m)   Wt 80.7 kg   SpO2 99%   BMI 32.56 kg/m   General:   Alert,  pleasant and cooperative in NAD Head:  Normocephalic and atraumatic. Neck:  Supple; no masses or thyromegaly. Lungs:  Clear throughout to auscultation, normal respiratory effort.    Heart:  +S1, +S2, Regular rate and rhythm, No edema. Abdomen:  Soft, nontender and nondistended. Normal bowel sounds, without guarding, and without rebound.   Neurologic:  Alert and  oriented x4;  grossly normal neurologically.  Impression/Plan: Shirline Kendle is here for an colonoscopy to be performed for Screening colonoscopy average risk   Risks, benefits, limitations, and alternatives regarding  colonoscopy have been reviewed with the patient.  Questions have been answered.  All parties agreeable.   Jonathon Bellows, MD  12/30/2020, 7:41 AM

## 2020-12-30 NOTE — Op Note (Signed)
[facility] Gastroenterology Patient Name: Lori Beltran Procedure Date: 12/30/2020 7:13 AM MRN: [id number] Account #: [id number] Date of Birth: 01-13-1976 Admit Type: Outpatient Age: 45 Room: [facility] ENDO ROOM 2 Gender: Female Note Status: Finalized Procedure:             Colonoscopy Indications:           Screening for colorectal malignant neoplasm Providers:             Jonathon Bellows MD, MD Referring MD:          Bethena Roys. Sowles, MD (Referring MD) Medicines:             Monitored Anesthesia Care Complications:         No immediate complications. Procedure:             Pre-Anesthesia Assessment:                        - Prior to the procedure, a History and Physical was                         performed, and patient medications, allergies and                         sensitivities were reviewed. The patient's tolerance                         of previous anesthesia was reviewed.                        - The risks and benefits of the procedure and the                         sedation options and risks were discussed with the                         patient. All questions were answered and informed                         consent was obtained.                        - ASA Grade Assessment: II - A patient with mild                         systemic disease.                        After obtaining informed consent, the colonoscope was                         passed under direct vision. Throughout the procedure,                         the patient's blood pressure, pulse, and oxygen                         saturations were monitored continuously. The                         Colonoscope was introduced through the anus  and                         advanced to the the cecum, identified by the                         appendiceal orifice. The colonoscopy was performed                         with ease. The patient tolerated the procedure well.                         The  quality of the bowel preparation was excellent. Findings:      The entire examined colon appeared normal on direct and retroflexion       views. Impression:            - The entire examined colon is normal on direct and                         retroflexion views.                        - No specimens collected. Recommendation:        - Discharge patient to home (with escort).                        - Resume previous diet.                        - Continue present medications.                        - Repeat colonoscopy in 10 years for screening                         purposes. Procedure Code(s):     --- Professional ---                        [phone], Colonoscopy, flexible; diagnostic, including                         collection of specimen(s) by brushing or washing, when                         performed (separate procedure) Diagnosis Code(s):     --- Professional ---                        Z12.11, Encounter for screening for malignant neoplasm                         of colon CPT copyright 2019 [employer]. All rights reserved. The codes documented in this report are preliminary and upon coder review may  be revised to meet current compliance requirements. Jonathon Bellows, MD Jonathon Bellows MD, MD 12/30/2020 7:58:45 AM This report has been signed electronically. Number of Addenda: 0 Note Initiated On: 12/30/2020 7:13 AM Scope Withdrawal Time: 0 hours 9 minutes 46 seconds  Total Procedure Duration: 0 hours 12 minutes 5 seconds  Estimated Blood Loss:  Estimated blood loss: none.  [facility]

## 2020-12-30 NOTE — Transfer of Care (Signed)
Immediate Anesthesia Transfer of Care Note  Patient: Lori Beltran  Procedure(s) Performed: COLONOSCOPY WITH PROPOFOL (N/A )  Patient Location: PACU  Anesthesia Type:General  Level of Consciousness: awake and alert   Airway & Oxygen Therapy: Patient Spontanous Breathing  Post-op Assessment: Report given to RN and Post -op Vital signs reviewed and stable  Post vital signs: Reviewed and stable  Last Vitals:  Vitals Value Taken Time  BP 100/73 12/30/20 0800  Temp    Pulse 76 12/30/20 0800  Resp 25 12/30/20 0800  SpO2 99 % 12/30/20 0800  Vitals shown include unvalidated device data.  Last Pain:  Vitals:   12/30/20 0718  TempSrc: Temporal  PainSc: 0-No pain         Complications: No complications documented.

## 2021-01-03 NOTE — Anesthesia Postprocedure Evaluation (Signed)
Anesthesia Post Note  Patient: Lori Beltran  Procedure(s) Performed: COLONOSCOPY WITH PROPOFOL (N/A )  Patient location during evaluation: Endoscopy Anesthesia Type: General Level of consciousness: awake and alert and oriented Pain management: pain level controlled Vital Signs Assessment: post-procedure vital signs reviewed and stable Respiratory status: spontaneous breathing Cardiovascular status: blood pressure returned to baseline Anesthetic complications: no   No complications documented.   Last Vitals:  Vitals:   12/30/20 0820 12/30/20 0830  BP: 122/82 111/71  Pulse: 64 64  Resp: (!) 22 (!) 22  Temp:    SpO2: 100% 100%    Last Pain:  Vitals:   12/31/20 0956  TempSrc:   PainSc: 0-No pain                 Issaiah Seabrooks

## 2021-01-24 NOTE — Progress Notes (Signed)
Name: Lori Beltran   MRN: [id number]    DOB: 1976/09/11   Date:01/25/2021       Progress Note  Subjective  Chief Complaint  Acute referral for knee pain and follow up  HPI  Knee pain: she has a long history of left knee pain and intermittent effusion, however over the past 4 months she noticed pain on right anterior knee also, with intermittent swelling, not associated with redness or increase in warmth. She uses ice and tylenol , sometimes uses Voltaren gel. She very active growing up, playing all sports at school including basketball and track - long jump, high jump. She has been trying to resume physical activity but even walking increases her pain. 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 She was having episodes weekly last Summer. She had allergy panel done that showed allergy to penauts, wheat, egg whites, milk, Hazelnut. She states she has been mindful about her diet and not episodes since last Summer. Taking Zyrtec only a couple of times a week   Chronic constipation:  only takes Linzess prn because it causes diarrhea and cramping, we will try switching to Trulance. Symptoms started as a young adult, after living HS  GERD: she is taking Omeprazole prn only, currently not having heart burn or indigestion, states symptoms improved with dietary changes   Hyperglycemia: she is cutting down on sweets, she denies polyphagia, polydipsia or polyuria.   Obesity: she states she will try to cut down on desserts, she has not been very physically active due to knee pain. She has lost weight since last year. She is trying to lose down to 145 lbs.   Patient Active Problem List   Diagnosis Date Noted  . Angioedema  07/04/2018  . 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 Chronic constipation 07/15/2015  . History of iron deficiency anemia 07/15/2015  . Migraine without aura and without status migrainosus, not intractable 05/04/2015  . Environmental and seasonal allergies 05/04/2015  . Obesity (BMI 30.0-34.9) 05/04/2015  . Plantar fasciitis of left foot 05/04/2015  . Vitamin D deficiency 05/04/2015  . Snoring 05/04/2015  . GERD (gastroesophageal reflux disease) 05/04/2015    Past Surgical History:  Procedure Laterality Date  . COLONOSCOPY WITH PROPOFOL N/A 12/30/2020   Procedure: COLONOSCOPY WITH PROPOFOL;  Surgeon: Jonathon Bellows, MD;  Location: [facility];  Service: Gastroenterology;  Laterality: N/A;  . COLPOSCOPY    . ENDOMETRIAL ABLATION    . LEEP    . TUBAL LIGATION      Family History  Problem Relation Age of Onset  . Sleep apnea Mother   . Sleep apnea Father   . Allergic rhinitis Daughter   . Allergic rhinitis Son   . Breast cancer Paternal Grandmother 54  . Brain cancer Paternal Grandmother   . Breast cancer Maternal Aunt 80  . Colon cancer Maternal Uncle   . Lung cancer Maternal Uncle   . Prostate cancer Maternal Uncle   . Bone cancer Maternal Aunt     Social History   Tobacco Use  . Smoking status: Never Smoker  . Smokeless tobacco: Never Used  Substance Use Topics  .  Alcohol use: No    Alcohol/week: 0.0 standard drinks     Current Outpatient Medications:  .  cetirizine (ZYRTEC) 10 MG tablet, Take 10 mg by mouth daily., Disp: , Rfl:  .  Cholecalciferol (VITAMIN D) 50 MCG (2000 UT) CAPS, Take 1 capsule by mouth daily., Disp: , Rfl:  .  EPINEPHrine 0.3 mg/0.3 mL IJ SOAJ injection, Inject 0.3 mg into the muscle as needed for anaphylaxis., Disp: 1 each, Rfl: 1 .  fluticasone (FLONASE) 50 MCG/ACT nasal spray, Place 2 sprays into both nostrils daily., Disp: 16 g, Rfl: 6 .  Multiple Vitamins-Minerals (MULTIVITAL) tablet, Take  by mouth., Disp: , Rfl:  .  omeprazole (PRILOSEC) 40 MG capsule, TAKE 1 CAPSULE (40 MG TOTAL) BY MOUTH DAILY., Disp: 30 capsule, Rfl: 5 .  Plecanatide (TRULANCE) 3 MG TABS, Take 1 tablet by mouth daily., Disp: 90 tablet, Rfl: 1 .  valACYclovir (VALTREX) 500 MG tablet, TAKE 1 TABLET BY MOUTH TWICE DAILY FOR 3 DAYS AS NEEDED FOR SYMPTOMS, Disp: 30 tablet, Rfl: 1  Allergies  Allergen Reactions  . Peanut Butter Flavor Itching and Swelling  . Shellfish Allergy Itching and Swelling    I personally reviewed active problem list, medication list, allergies, family history, social history, health maintenance with the patient/caregiver today.   ROS  Constitutional: Negative for fever , positive for  weight change.  Respiratory: Negative for cough and shortness of breath.   Cardiovascular: Negative for chest pain or palpitations.  Gastrointestinal: Negative for abdominal pain, no bowel changes.  Musculoskeletal: Positive  for gait problem also positive intermittent  joint swelling.  Skin: Negative for rash.  Neurological: Negative for dizziness or headache.  No other specific complaints in a complete review of systems (except as listed in HPI above).  Objective  Vitals:   01/25/21 0855  BP: 118/82  Pulse: 77  Resp: 16  Temp: 98.1 F (36.7 C)  TempSrc: Oral  SpO2: 97%  Weight: 177 lb 14.4 oz (80.7 kg)  Height: 5\' 2"  (1.575 m)    Body mass index is 32.54 kg/m.  Physical Exam  Constitutional: Patient appears well-developed and well-nourished. Obese No distress.  HEENT: head atraumatic, normocephalic, pupils equal and reactive to light,neck supple Cardiovascular: Normal rate, regular rhythm and normal heart sounds.  No murmur heard. No BLE edema. Pulmonary/Chest: Effort normal and breath sounds normal. No respiratory distress. Muscular skeletal: crepitus with extension of both knees , no effusion  Abdominal: Soft.  There is no tenderness. Psychiatric: Patient has a normal mood and  affect. behavior is normal. Judgment and thought content normal.  Recent Results (from the past 2160 hour(s))  SARS CORONAVIRUS 2 (TAT 6-24 HRS) Nasopharyngeal Nasopharyngeal Swab     Status: None   Collection Time: 12/28/20 10:25 AM   Specimen: Nasopharyngeal Swab  Result Value Ref Range   SARS Coronavirus 2 NEGATIVE NEGATIVE    Comment: (NOTE) SARS-CoV-2 target nucleic acids are NOT DETECTED.  The SARS-CoV-2 RNA is generally detectable in upper and lower respiratory specimens during the acute phase of infection. Negative results do not preclude SARS-CoV-2 infection, do not rule out co-infections with other pathogens, and should not be used as the sole basis for treatment or other patient management decisions. Negative results must be combined with clinical observations, patient history, and epidemiological information. The expected result is Negative.  Fact Sheet for Patients: SugarRoll.be  Fact Sheet for Healthcare Providers: https://www.woods-mathews.com/  This test is not yet approved or cleared by the [place] FDA and  has  been authorized for detection and/or diagnosis of SARS-CoV-2 by FDA under an Emergency Use Authorization (EUA). This EUA will remain  in effect (meaning this test can be used) for the duration of the COVID-19 declaration under Se ction 564(b)(1) of the Act, 21 U.S.C. section 360bbb-3(b)(1), unless the authorization is terminated or revoked sooner.  Performed at [facility], [place] [address] [place], [place] [address]   Pregnancy, urine POC     Status: None   Collection Time: 12/30/20  7:12 AM  Result Value Ref Range   Preg Test, Ur NEGATIVE NEGATIVE    Comment:        THE SENSITIVITY OF THIS METHODOLOGY IS >24 mIU/mL     PHQ2/9: Depression screen [facility] 2/9 01/25/2021 01/22/2020 12/29/2019 07/09/2019 10/03/2018  Decreased Interest 0 0 0 0 0  Down, Depressed, Hopeless 0 0 0 0 0  PHQ - 2 Score 0 0 0 0 0   Altered sleeping - 0 3 0 3  Tired, decreased energy - 0 3 2 3   Change in appetite - 0 0 1 0  Feeling bad or failure about yourself  - 0 0 0 0  Trouble concentrating - 0 0 0 0  Moving slowly or fidgety/restless - 0 0 0 0  Suicidal thoughts - 0 0 0 0  PHQ-9 Score - 0 6 3 6   Difficult doing work/chores - Not difficult at all Not difficult at all Not difficult at all Somewhat difficult    phq 9 is negative   Fall Risk: Fall Risk  01/25/2021 01/22/2020 12/29/2019 07/09/2019 10/03/2018  Falls in the past year? 0 0 0 0 -  Number falls in past yr: 0 0 0 0 0  Injury with Fall? - 0 0 0 0  Follow up Falls prevention discussed - - - -     Assessment & Plan  1. Primary osteoarthritis of both knees  - Ambulatory referral to Sports Medicine We will add meloxicam to see if improves symptoms   2. Gastroesophageal reflux disease without esophagitis  Taking prn PPI  3. Angioedema, subsequent encounter  Doing well   4. Hyperglycemia  Continue life style modification  5. Need for Tdap vaccination  Today   6. Obesity (BMI 30-39.9)  Discussed with the patient the risk posed by an increased BMI. Discussed importance of portion control, calorie counting and at least 150 minutes of physical activity weekly. Avoid sweet beverages and drink more water. Eat at least 6 servings of fruit and vegetables daily   7. Chronic idiopathic constipation  - Plecanatide (TRULANCE) 3 MG TABS; Take 1 tablet by mouth daily.  Dispense: 90 tablet; Refill: 1

## 2021-01-25 ENCOUNTER — Ambulatory Visit (INDEPENDENT_AMBULATORY_CARE_PROVIDER_SITE_OTHER): Payer: No Typology Code available for payment source | Admitting: Family Medicine

## 2021-01-25 ENCOUNTER — Encounter: Payer: Self-pay | Admitting: Family Medicine

## 2021-01-25 ENCOUNTER — Other Ambulatory Visit: Payer: Self-pay

## 2021-01-25 VITALS — BP 118/82 | HR 77 | Temp 98.1°F | Resp 16 | Ht 62.0 in | Wt 177.9 lb

## 2021-01-25 DIAGNOSIS — K219 Gastro-esophageal reflux disease without esophagitis: Secondary | ICD-10-CM | POA: Diagnosis not present

## 2021-01-25 DIAGNOSIS — T783XXD Angioneurotic edema, subsequent encounter: Secondary | ICD-10-CM

## 2021-01-25 DIAGNOSIS — M170 Bilateral primary osteoarthritis of knee: Secondary | ICD-10-CM

## 2021-01-25 DIAGNOSIS — Z23 Encounter for immunization: Secondary | ICD-10-CM

## 2021-01-25 DIAGNOSIS — R739 Hyperglycemia, unspecified: Secondary | ICD-10-CM | POA: Diagnosis not present

## 2021-01-25 DIAGNOSIS — K5904 Chronic idiopathic constipation: Secondary | ICD-10-CM

## 2021-01-25 DIAGNOSIS — E669 Obesity, unspecified: Secondary | ICD-10-CM

## 2021-01-25 MED ORDER — TRULANCE 3 MG PO TABS
1.0000 | ORAL_TABLET | Freq: Every day | ORAL | 1 refills | Status: DC
  Filled 2021-01-25 – 2021-03-14 (×2): qty 90, 90d supply, fill #0

## 2021-01-25 MED ORDER — MELOXICAM 15 MG PO TABS
15.0000 mg | ORAL_TABLET | Freq: Every day | ORAL | 0 refills | Status: DC
Start: 2021-01-25 — End: 2021-02-09
  Filled 2021-01-25: qty 30, 30d supply, fill #0

## 2021-01-25 NOTE — Patient Instructions (Signed)
Journal for Nurse Practitioners, 15(4), 263-267. Retrieved July 28, 2018 from http://clinicalkey.com/nursing">  Knee Exercises Ask your health care provider which exercises are safe for you. Do exercises exactly as told by your health care provider and adjust them as directed. It is normal to feel mild stretching, pulling, tightness, or discomfort as you do these exercises. Stop right away if you feel sudden pain or your pain gets worse. Do not begin these exercises until told by your health care provider. Stretching and range-of-motion exercises These exercises warm up your muscles and joints and improve the movement and flexibility of your knee. These exercises also help to relieve pain and swelling. Knee extension, prone 1. Lie on your abdomen (prone position) on a bed. 2. Place your left / right knee just beyond the edge of the surface so your knee is not on the bed. You can put a towel under your left / right thigh just above your kneecap for comfort. 3. Relax your leg muscles and allow gravity to straighten your knee (extension). You should feel a stretch behind your left / right knee. 4. Hold this position for __________ seconds. 5. Scoot up so your knee is supported between repetitions. Repeat __________ times. Complete this exercise __________ times a day. Knee flexion, active 1. Lie on your back with both legs straight. If this causes back discomfort, bend your left / right knee so your foot is flat on the floor. 2. Slowly slide your left / right heel back toward your buttocks. Stop when you feel a gentle stretch in the front of your knee or thigh (flexion). 3. Hold this position for __________ seconds. 4. Slowly slide your left / right heel back to the starting position. Repeat __________ times. Complete this exercise __________ times a day.   Quadriceps stretch, prone 1. Lie on your abdomen on a firm surface, such as a bed or padded floor. 2. Bend your left / right knee and hold  your ankle. If you cannot reach your ankle or pant leg, loop a belt around your foot and grab the belt instead. 3. Gently pull your heel toward your buttocks. Your knee should not slide out to the side. You should feel a stretch in the front of your thigh and knee (quadriceps). 4. Hold this position for __________ seconds. Repeat __________ times. Complete this exercise __________ times a day.   Hamstring, supine 1. Lie on your back (supine position). 2. Loop a belt or towel over the ball of your left / right foot. The ball of your foot is on the walking surface, right under your toes. 3. Straighten your left / right knee and slowly pull on the belt to raise your leg until you feel a gentle stretch behind your knee (hamstring). ? Do not let your knee bend while you do this. ? Keep your other leg flat on the floor. 4. Hold this position for __________ seconds. Repeat __________ times. Complete this exercise __________ times a day. Strengthening exercises These exercises build strength and endurance in your knee. Endurance is the ability to use your muscles for a long time, even after they get tired. Quadriceps, isometric This exercise stretches the muscles in front of your thigh (quadriceps) without moving your knee joint (isometric). 1. Lie on your back with your left / right leg extended and your other knee bent. Put a rolled towel or small pillow under your knee if told by your health care provider. 2. Slowly tense the muscles in the front of your   left / right thigh. You should see your kneecap slide up toward your hip or see increased dimpling just above the knee. This motion will push the back of the knee toward the floor. 3. For __________ seconds, hold the muscle as tight as you can without increasing your pain. 4. Relax the muscles slowly and completely. Repeat __________ times. Complete this exercise __________ times a day.   Straight leg raises This exercise stretches the muscles in  front of your thigh (quadriceps) and the muscles that move your hips (hip flexors). 1. Lie on your back with your left / right leg extended and your other knee bent. 2. Tense the muscles in the front of your left / right thigh. You should see your kneecap slide up or see increased dimpling just above the knee. Your thigh may even shake a bit. 3. Keep these muscles tight as you raise your leg 4-6 inches (10-15 cm) off the floor. Do not let your knee bend. 4. Hold this position for __________ seconds. 5. Keep these muscles tense as you lower your leg. 6. Relax your muscles slowly and completely after each repetition. Repeat __________ times. Complete this exercise __________ times a day. Hamstring, isometric 1. Lie on your back on a firm surface. 2. Bend your left / right knee about __________ degrees. 3. Dig your left / right heel into the surface as if you are trying to pull it toward your buttocks. Tighten the muscles in the back of your thighs (hamstring) to "dig" as hard as you can without increasing any pain. 4. Hold this position for __________ seconds. 5. Release the tension gradually and allow your muscles to relax completely for __________ seconds after each repetition. Repeat __________ times. Complete this exercise __________ times a day. Hamstring curls If told by your health care provider, do this exercise while wearing ankle weights. Begin with __________ lb weights. Then increase the weight by 1 lb (0.5 kg) increments. Do not wear ankle weights that are more than __________ lb. 1. Lie on your abdomen with your legs straight. 2. Bend your left / right knee as far as you can without feeling pain. Keep your hips flat against the floor. 3. Hold this position for __________ seconds. 4. Slowly lower your leg to the starting position. Repeat __________ times. Complete this exercise __________ times a day.   Squats This exercise strengthens the muscles in front of your thigh and knee  (quadriceps). 1. Stand in front of a table, with your feet and knees pointing straight ahead. You may rest your hands on the table for balance but not for support. 2. Slowly bend your knees and lower your hips like you are going to sit in a chair. ? Keep your weight over your heels, not over your toes. ? Keep your lower legs upright so they are parallel with the table legs. ? Do not let your hips go lower than your knees. ? Do not bend lower than told by your health care provider. ? If your knee pain increases, do not bend as low. 3. Hold the squat position for __________ seconds. 4. Slowly push with your legs to return to standing. Do not use your hands to pull yourself to standing. Repeat __________ times. Complete this exercise __________ times a day. Wall slides This exercise strengthens the muscles in front of your thigh and knee (quadriceps). 1. Lean your back against a smooth wall or door, and walk your feet out 18-24 inches (46-61 cm) from it. 2.   Place your feet hip-width apart. 3. Slowly slide down the wall or door until your knees bend __________ degrees. Keep your knees over your heels, not over your toes. Keep your knees in line with your hips. 4. Hold this position for __________ seconds. Repeat __________ times. Complete this exercise __________ times a day.   Straight leg raises This exercise strengthens the muscles that rotate the leg at the hip and move it away from your body (hip abductors). 1. Lie on your side with your left / right leg in the top position. Lie so your head, shoulder, knee, and hip line up. You may bend your bottom knee to help you keep your balance. 2. Roll your hips slightly forward so your hips are stacked directly over each other and your left / right knee is facing forward. 3. Leading with your heel, lift your top leg 4-6 inches (10-15 cm). You should feel the muscles in your outer hip lifting. ? Do not let your foot drift forward. ? Do not let your  knee roll toward the ceiling. 4. Hold this position for __________ seconds. 5. Slowly return your leg to the starting position. 6. Let your muscles relax completely after each repetition. Repeat __________ times. Complete this exercise __________ times a day.   Straight leg raises This exercise stretches the muscles that move your hips away from the front of the pelvis (hip extensors). 1. Lie on your abdomen on a firm surface. You can put a pillow under your hips if that is more comfortable. 2. Tense the muscles in your buttocks and lift your left / right leg about 4-6 inches (10-15 cm). Keep your knee straight as you lift your leg. 3. Hold this position for __________ seconds. 4. Slowly lower your leg to the starting position. 5. Let your leg relax completely after each repetition. Repeat __________ times. Complete this exercise __________ times a day. This information is not intended to replace advice given to you by your health care provider. Make sure you discuss any questions you have with your health care provider. Document Revised: 07/29/2018 Document Reviewed: 07/29/2018 Elsevier Patient Education  2021 [employer]  

## 2021-01-30 ENCOUNTER — Ambulatory Visit (INDEPENDENT_AMBULATORY_CARE_PROVIDER_SITE_OTHER): Payer: No Typology Code available for payment source | Admitting: Family Medicine

## 2021-01-30 ENCOUNTER — Encounter: Payer: Self-pay | Admitting: Family Medicine

## 2021-01-30 ENCOUNTER — Other Ambulatory Visit: Payer: Self-pay

## 2021-01-30 VITALS — BP 110/62 | HR 80 | Ht 62.0 in | Wt 172.0 lb

## 2021-01-30 DIAGNOSIS — M170 Bilateral primary osteoarthritis of knee: Secondary | ICD-10-CM | POA: Diagnosis not present

## 2021-01-30 NOTE — Patient Instructions (Signed)
-   Dose meloxicam daily until follow-up - We will contact you in regards to gel scheduing

## 2021-01-31 ENCOUNTER — Telehealth: Payer: Self-pay

## 2021-01-31 DIAGNOSIS — M170 Bilateral primary osteoarthritis of knee: Secondary | ICD-10-CM | POA: Insufficient documentation

## 2021-01-31 NOTE — Assessment & Plan Note (Signed)
Patient with chronic bilateral primarily patellofemoral arthralgia in the setting of mild osteoarthritis with radiographic evidence of medial tibiofemoral and patellofemoral degenerative changes bilaterally.  Clinically she is maximally tender at the patellar facets left greater than right, trace effusion noted on the left knee and 1+ effusion on the right knee, painful resisted knee extension bilaterally and crepitus appreciated.  Fortunately provocative testing otherwise benign.  Her clinical history is also negative for mechanical symptoms but has subjective near buckling episodes reported.  Given her stated limited response to intra-articular cortisone in the past, radiographic findings, I have reviewed additional treatment strategies and we will coordinate viscosupplementation bilaterally.  In the interim I have advised her to restart meloxicam as prescribed by her PCP Dr. Ancil Boozer and dose the meloxicam scheduled until her return.  Given her reported avian allergies we will attempt to obtain single injection nonavian viscosupplementation at our clinic will contact her to coordinate appropriate follow-up.

## 2021-01-31 NOTE — Telephone Encounter (Signed)
Patient is waiting on approval or denial for bilateral Monovisc gel injections.  Initiated PA for Monovisc 22mg /ml bilateral via [employer] website with diagnosis M17.0 indicating bilateral knee osteoarthritis.  Patient tried and failed Tylenol Arthritis, Meloxicam, and Methylprednisolone Injection of left knee 06/2019.  Patient has prior left knee X-ray imaging from 07/17/2019 noting "Radiographs of left knee with mild degenerative changes. Patella is a  mildly high riding. No acute finding."  New X-ray orders for 4 views of bilateral knees complete pending as of today.  Faxed completed PA form to [employer] at [phone].  Awaiting for PA to return within 24 hrs.

## 2021-01-31 NOTE — Progress Notes (Signed)
New Patient Office Visit  Subjective:  Patient ID: Lori Beltran, female    DOB: Sep 08, 1976  Age: 45 y.o. MRN: [id number]  CC:  Chief Complaint  Patient presents with  . Establish Care  . Knee Pain    Left knee has been causing pain for 2 years and Rt knee just started but both are getting worse. Everyday intermittent. Tries to walk but causes pain to be worse during the day. She has had cortisone shots in Left knee. Did not help patient with patient.     HPI Lori Beltran presents for evaluation of bilateral atraumatic knee pain, left greater than right, anterior without radiation.  Has noted on and off swelling, near buckling, and severe stiffness primarily with motion after period of immobility.  She has been dosing Tylenol arthritis in a semischeduled manner with limited response.  She was prescribed meloxicam by her PCP Dr. Ancil Boozer but has not dosed this consistently.  She has had cortisone injections in the distant past with a few months of relief following each injection.  Past Medical History:  Diagnosis Date  . Arthritis    left knee  . Cervical dysplasia    CINII  . Constipation   . Herpes   . Migraine   . Obesity   . Pap smear abnormality of vagina with ASC-US   . Plantar fasciitis   . Sleep apnea   . Snoring   . Vitamin D deficiency     Past Surgical History:  Procedure Laterality Date  . COLONOSCOPY WITH PROPOFOL N/A 12/30/2020   Procedure: COLONOSCOPY WITH PROPOFOL;  Surgeon: Jonathon Bellows, MD;  Location: [facility] ENDOSCOPY;  Service: Gastroenterology;  Laterality: N/A;  . COLPOSCOPY    . ENDOMETRIAL ABLATION    . LEEP    . TUBAL LIGATION      Family History  Problem Relation Age of Onset  . Sleep apnea Mother   . Sleep apnea Father   . Allergic rhinitis Daughter   . Allergic rhinitis Son   . Breast cancer Paternal Grandmother 62  . Brain cancer Paternal Grandmother   . Breast cancer Maternal Aunt 80  . Colon cancer Maternal Uncle   . Lung cancer  Maternal Uncle   . Prostate cancer Maternal Uncle   . Bone cancer Maternal Aunt     Social History   Socioeconomic History  . Marital status: Married    Spouse name: Gerald Stabs   . Number of children: 3  . Years of education: Not on file  . Highest education level: Associate degree: occupational, Hotel manager, or vocational program  Occupational History  . Not on file  Tobacco Use  . Smoking status: Never Smoker  . Smokeless tobacco: Never Used  Vaping Use  . Vaping Use: Never used  Substance and Sexual Activity  . Alcohol use: No    Alcohol/week: 0.0 standard drinks  . Drug use: No  . Sexual activity: Yes    Partners: Male    Birth control/protection: Other-see comments, Surgical    Comment: TL  Other Topics Concern  . Not on file  Social History Narrative  . Not on file   Social Determinants of Health   Financial Resource Strain: Not on file  Food Insecurity: Not on file  Transportation Needs: Not on file  Physical Activity: Not on file  Stress: Not on file  Social Connections: Not on file  Intimate Partner Violence: Not on file    ROS Review of Systems details in HPI  Objective:  Today's Vitals: BP 110/62   Pulse 80   Ht 5\' 2"  (1.575 m)   Wt 172 lb (78 kg)   SpO2 97%   BMI 31.46 kg/m   Physical Exam details in A&P  Independent interpretation of MRI of right knee without contrast dated 03/14/2005 reveals chondral thinning at the medial tibiofemoral articulation, mild in nature, robust patellofemoral cartilage present, effusion noted in the suprapatellar bursa, ACL, PCL intact, medial/lateral menisci without overt pathology though image 9/21 on series 12 shows linear signal in posterior horn of the medial meniscus, quadriceps and patellar tendons benign, no evidence of bony edema, no acute osseous or soft tissue processes noted Independent interpretation of x-ray knee 1-2 views left obtained on 07/17/2019 reveals mild medial tibiofemoral degenerative change and  space narrowing, superior greater than inferior patellar pole osteophytes, no acute osseous process identified  Assessment & Plan:   Problem List Items Addressed This Visit      Musculoskeletal and Integument   Primary osteoarthritis of both knees - Primary    Patient with chronic bilateral primarily patellofemoral arthralgia in the setting of mild osteoarthritis with radiographic evidence of medial tibiofemoral and patellofemoral degenerative changes bilaterally.  Clinically she is maximally tender at the patellar facets left greater than right, trace effusion noted on the left knee and 1+ effusion on the right knee, painful resisted knee extension bilaterally and crepitus appreciated.  Fortunately provocative testing otherwise benign.  Her clinical history is also negative for mechanical symptoms but has subjective near buckling episodes reported.  Given her stated limited response to intra-articular cortisone in the past, radiographic findings, I have reviewed additional treatment strategies and we will coordinate viscosupplementation bilaterally.  In the interim I have advised her to restart meloxicam as prescribed by her PCP Dr. Ancil Boozer and dose the meloxicam scheduled until her return.  Given her reported avian allergies we will attempt to obtain single injection nonavian viscosupplementation at our clinic will contact her to coordinate appropriate follow-up.      Relevant Orders   DG Knee Complete 4 Views Left   DG Knee Complete 4 Views Right      Outpatient Encounter Medications as of 01/30/2021  Medication Sig  . cetirizine (ZYRTEC) 10 MG tablet Take 10 mg by mouth daily.  . Cholecalciferol (VITAMIN D) 50 MCG (2000 UT) CAPS Take 1 capsule by mouth daily.  Marland Kitchen EPINEPHrine 0.3 mg/0.3 mL IJ SOAJ injection Inject 0.3 mg into the muscle as needed for anaphylaxis.  . fluticasone (FLONASE) 50 MCG/ACT nasal spray Place 2 sprays into both nostrils daily.  . meloxicam (MOBIC) 15 MG tablet Take  1 tablet (15 mg total) by mouth daily.  . Multiple Vitamins-Minerals (MULTIVITAL) tablet Take by mouth.  Marland Kitchen omeprazole (PRILOSEC) 40 MG capsule TAKE 1 CAPSULE (40 MG TOTAL) BY MOUTH DAILY.  Marland Kitchen Plecanatide (TRULANCE) 3 MG TABS Take 1 tablet by mouth daily.  . valACYclovir (VALTREX) 500 MG tablet TAKE 1 TABLET BY MOUTH TWICE DAILY FOR 3 DAYS AS NEEDED FOR SYMPTOMS   No facility-administered encounter medications on file as of 01/30/2021.    Follow-up: No follow-ups on file.   Montel Culver, MD

## 2021-02-02 ENCOUNTER — Telehealth: Payer: Self-pay

## 2021-02-02 NOTE — Telephone Encounter (Signed)
Spoke with Ivanna with [facility] at [phone] and advised that prescription form was sent over yesterday with a written signature and date.  Spoke with the pharmacist, Sharyn Lull, and gave a verbal order for Monovisc 22 mg/ml 2 syringes.  Will send paperwork if PA needs to be initiated.

## 2021-02-02 NOTE — Telephone Encounter (Unsigned)
Copied from [place] [id number]. Topic: General - Other >> Feb 01, 2021  5:42 PM Pawlus, Brayton Layman A wrote: Reason for CRM: [facility] stated they were sent over prescriptions that did not have a doctor signature or date. Pharmacist stated these could be given verbally. Please call back.

## 2021-02-03 ENCOUNTER — Telehealth: Payer: Self-pay

## 2021-02-03 ENCOUNTER — Other Ambulatory Visit: Payer: Self-pay

## 2021-02-03 ENCOUNTER — Other Ambulatory Visit: Payer: Self-pay | Admitting: Obstetrics and Gynecology

## 2021-02-03 DIAGNOSIS — A6004 Herpesviral vulvovaginitis: Secondary | ICD-10-CM

## 2021-02-03 NOTE — Telephone Encounter (Signed)
Copied from [place] [id number]. Topic: General - Other >> Feb 03, 2021  9:21 AM Leward Quan A wrote: Reason for CRM:  [facility] calledin to inform Dr that patient medication Monovisc 88 MG syringe need a prior authorization. Please call  Ph# [phone]

## 2021-02-05 MED ORDER — VALACYCLOVIR HCL 500 MG PO TABS
ORAL_TABLET | ORAL | 1 refills | Status: DC
  Filled 2021-02-05: qty 30, 15d supply, fill #0

## 2021-02-06 ENCOUNTER — Other Ambulatory Visit: Payer: Self-pay

## 2021-02-06 NOTE — Telephone Encounter (Signed)
PA initiated through CoverMyMeds.  Spoke with Deatra Canter with [facility] and received the key: [id number]

## 2021-02-07 ENCOUNTER — Ambulatory Visit
Admission: RE | Admit: 2021-02-07 | Discharge: 2021-02-07 | Disposition: A | Discharge status: Home / Self Care | Payer: No Typology Code available for payment source | Source: Ambulatory Visit | Attending: Family Medicine | Admitting: Family Medicine

## 2021-02-07 ENCOUNTER — Other Ambulatory Visit: Payer: Self-pay

## 2021-02-07 ENCOUNTER — Ambulatory Visit
Admission: RE | Admit: 2021-02-07 | Discharge: 2021-02-07 | Disposition: A | Discharge status: Home / Self Care | Payer: No Typology Code available for payment source | Attending: Family Medicine | Admitting: Family Medicine

## 2021-02-07 DIAGNOSIS — M170 Bilateral primary osteoarthritis of knee: Secondary | ICD-10-CM | POA: Diagnosis not present

## 2021-02-07 NOTE — Telephone Encounter (Signed)
PA pending

## 2021-02-09 ENCOUNTER — Other Ambulatory Visit: Payer: Self-pay

## 2021-02-09 ENCOUNTER — Encounter: Payer: Self-pay | Admitting: Family Medicine

## 2021-02-09 ENCOUNTER — Other Ambulatory Visit: Payer: Self-pay | Admitting: Family Medicine

## 2021-02-09 DIAGNOSIS — M170 Bilateral primary osteoarthritis of knee: Secondary | ICD-10-CM

## 2021-02-09 MED ORDER — DICLOFENAC SODIUM 75 MG PO TBEC
75.0000 mg | DELAYED_RELEASE_TABLET | Freq: Two times a day (BID) | ORAL | 0 refills | Status: DC | PRN
  Filled 2021-02-09: qty 30, 15d supply, fill #0

## 2021-02-09 NOTE — Progress Notes (Signed)
Patient notified and verbalized understanding.  Reviewed results with patient and answered questions associated.  See telephone encounter regarding Monovisc gel injections.

## 2021-02-09 NOTE — Telephone Encounter (Signed)
PA for Monovisc 88 mg/12mL bilateral knees approved through patient's insurance 02/07/21. Based on the Benefits Investigation Detail, patient's insurance will pay 80% of the injections and patient will be responsible for the remaining 20% (from $5,400 to $1,080). Patient notified and verbalized understanding.  Per patient would like to discuss with her husband before making the decision to receive these injections.  Patient does not have a FSA or HSA account to use for payment which will be collected from the specialty pharmacy before shipping to our office.  Per patient meloxicam prescribed is ineffective in treating her bilateral knee pain.  Please advise patient for alternative if applicable.

## 2021-02-13 NOTE — Telephone Encounter (Signed)
James with [facility] called saying they can not get the patient on the line so they are going to send a letter to the patient.

## 2021-02-14 NOTE — Telephone Encounter (Signed)
James with [facility] called in say that they are not able to reach patient by Ph#s that they have on file and need to speak to her about the Monovisc 88 MG. Can be reached at Ph# [phone]

## 2021-03-13 ENCOUNTER — Encounter: Payer: Self-pay | Admitting: Family Medicine

## 2021-03-14 ENCOUNTER — Other Ambulatory Visit: Payer: Self-pay

## 2021-07-07 IMAGING — CR DG KNEE COMPLETE 4+V*L*
1 series · 4 of 4 positions shown · non-contrast
Comparison: Left knee radiograph 07/17/2019

CLINICAL DATA: Bilateral knee pain for 5-6 months, left knee pain
and anterior swelling with locking. Anterior right knee pain,
lateral to the patella

EXAM:
LEFT KNEE - COMPLETE 4+ VIEW; RIGHT KNEE - COMPLETE 4+ VIEW

[Series 1: dg knee complete 4 views left · 0.14mm/px · 4 of 4 slices shown]
[im 1/4]
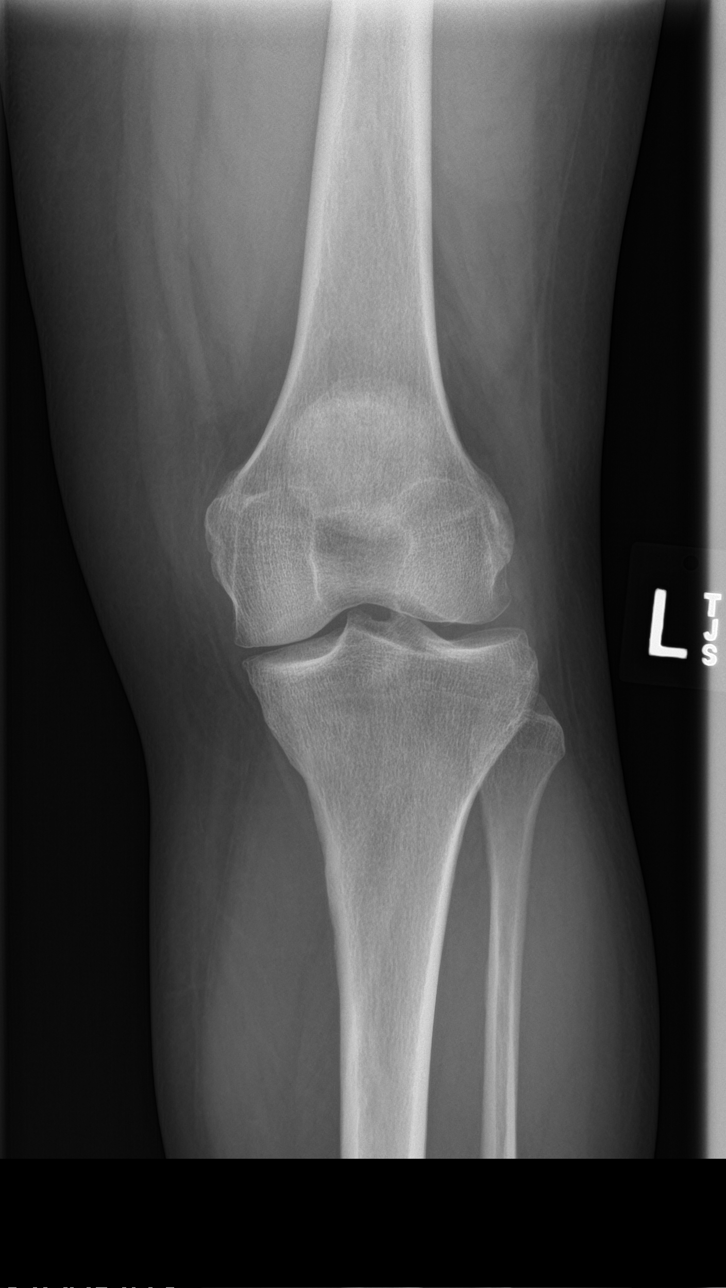
[im 2/4]
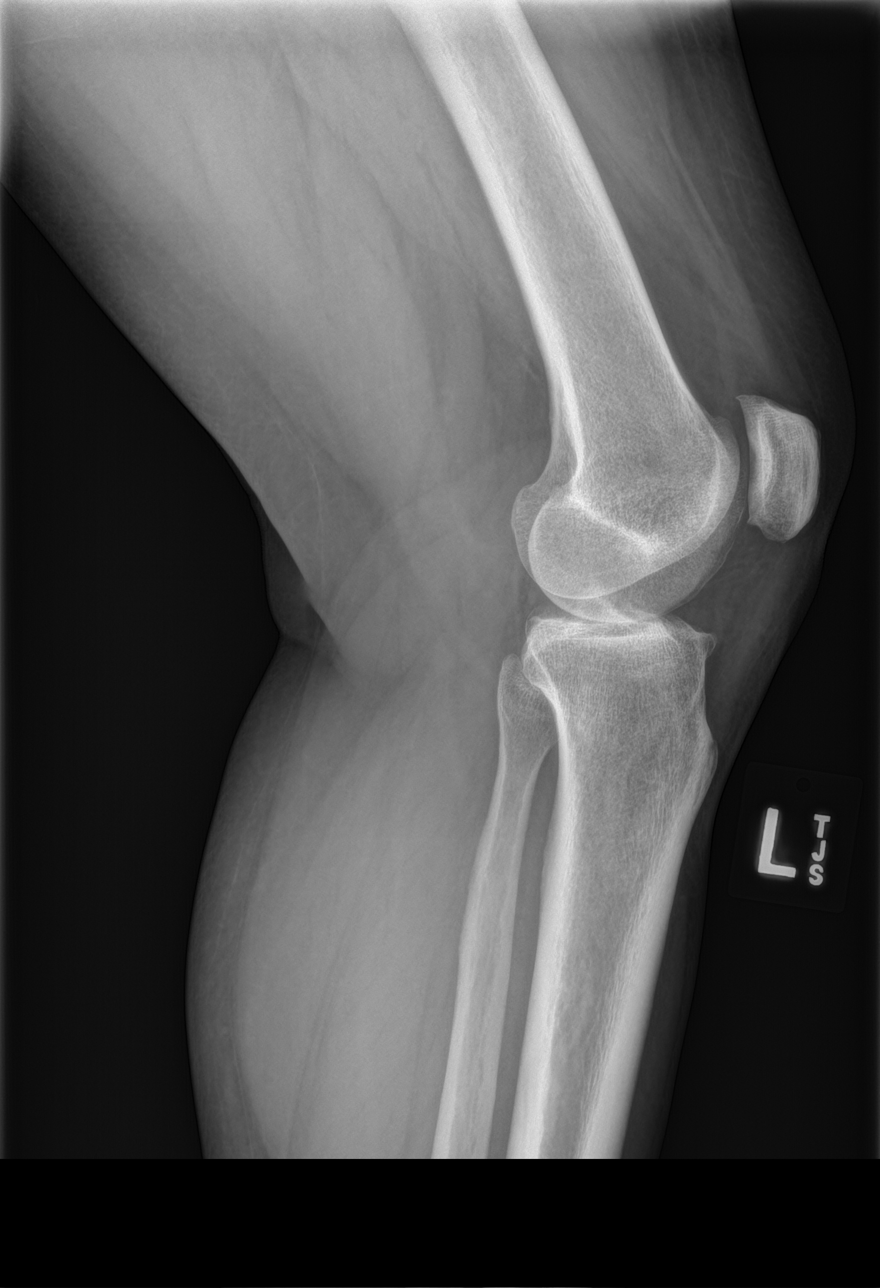
[im 3/4]
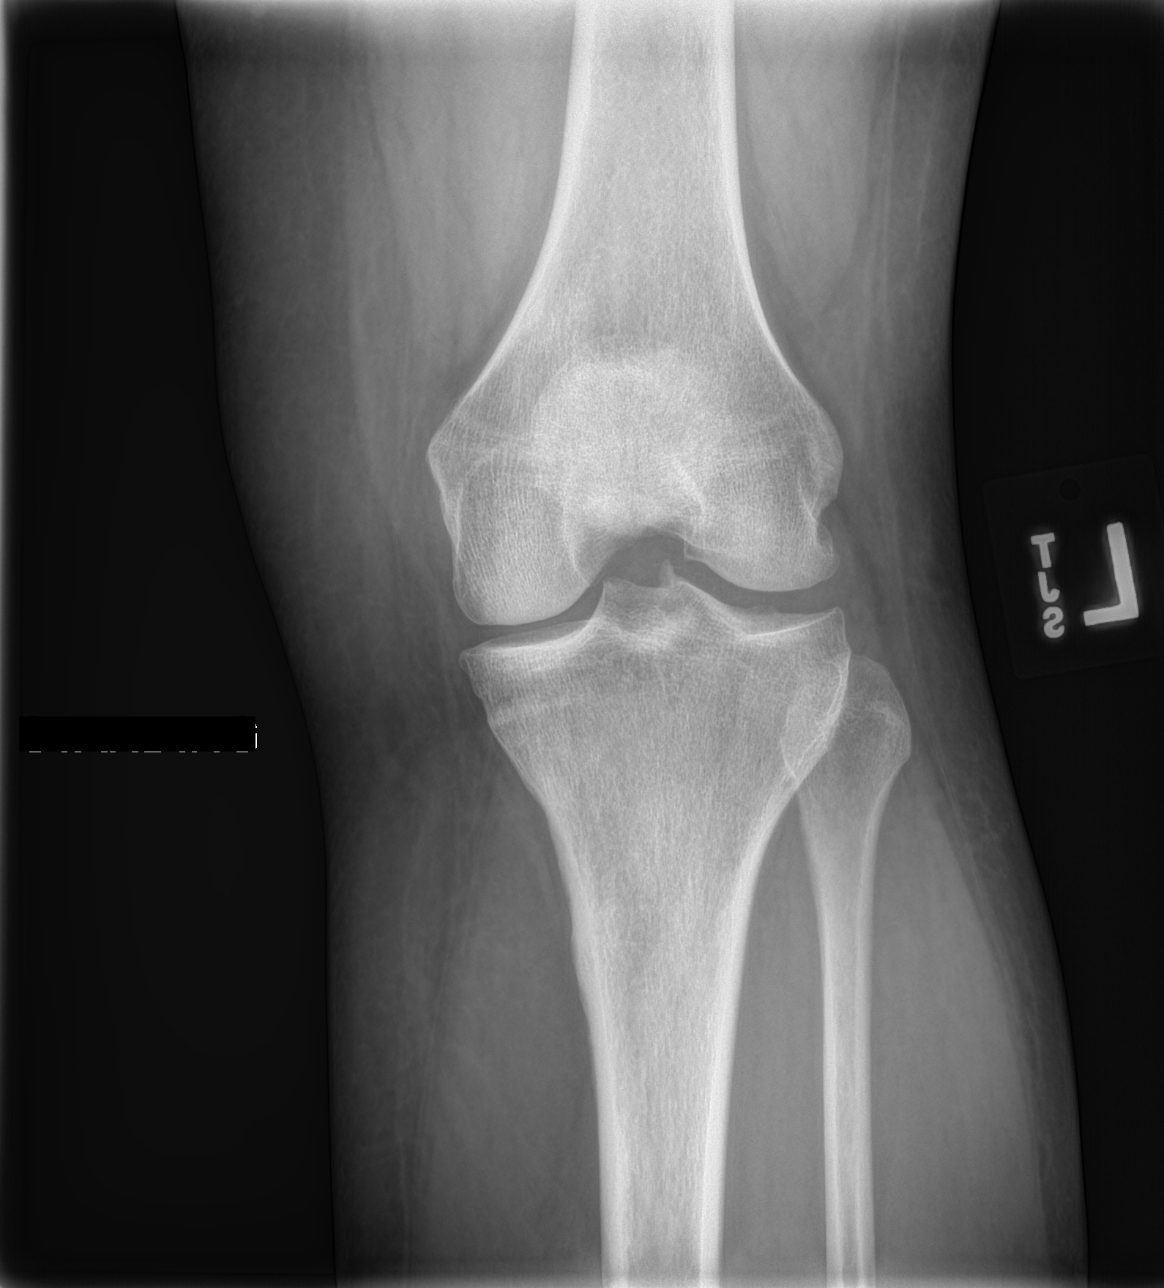
[im 4/4]
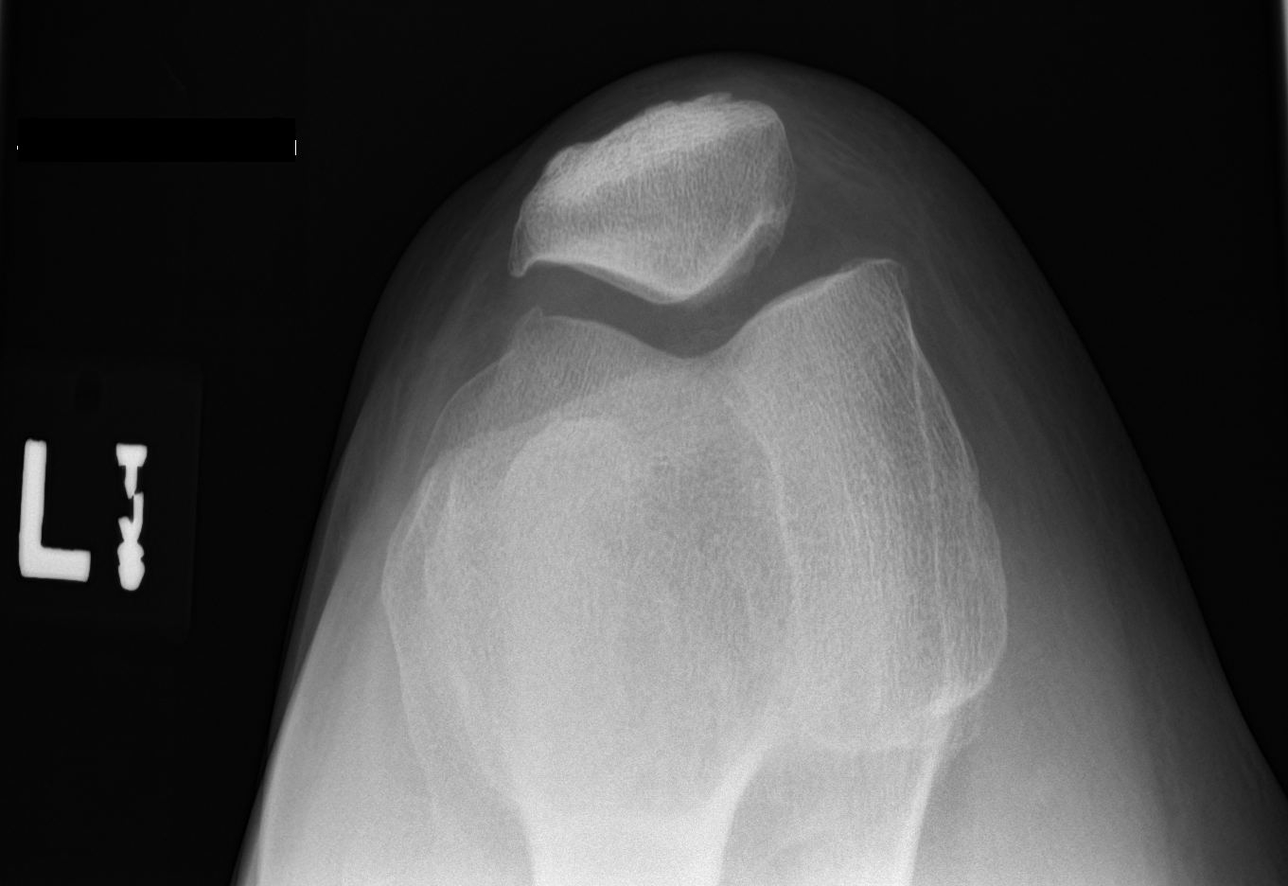

[4 of 4 positions shown; findings below may reference images not displayed]

FINDINGS: Left knee:

Weight-bearing radiographs of the left knee demonstrate some mild
tricompartmental osteoarthrosis and medial compartmental narrowing.
Trace suprapatellar fluid is at the upper limits of physiologic
normal. No significant soft tissue swelling. No acute bony
abnormality. Specifically, no fracture, subluxation, or dislocation.

Right knee:

Weight-bearing radiographs of the right knee demonstrate mild
tricompartmental osteoarthrosis and slight medial compartmental
narrowing. Small suprapatellar effusion. No significant soft tissue
swelling. No acute bony abnormality. Specifically, no fracture,
subluxation, or dislocation.
IMPRESSION: 1. Mild bilateral tricompartmental osteoarthrosis and medial
compartmental narrowing.
2. Small right suprapatellar effusion.
3. No significant swelling or acute soft tissue abnormalities.

## 2021-07-19 NOTE — Progress Notes (Signed)
Name: Lori Beltran   MRN: [id number]    DOB: 03-06-1976   Date:07/20/2021       Progress Note  Subjective  Chief Complaint  Consult  HPI  Obesity:   GERD: she is taking Omeprazole prn only, currently not having heart burn or indigestion. Doing well    Prediabetes  she is cutting down on sweets, she denies polyphagia, polydipsia or polyuria. Last A1C was 6 %, she wants to hold off on repeating labs during her CPE    Obesity: she states her weight when she graduated HS was 120 lbs, She states after birth of her first child her weight stays around 150 lbs, she states after third she started to have problems staying around 150 lbs. She has tried [employer] but only lost 6 lbs, she is always conscious about portion control , unable to do a lot of physical activity due to knee OA . She would like to try medications again. She tried Contrave in the past but it caused nausea and stopped. Discussed Qsymia but she is interested on Wegovy at this time. 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/2015   Chronic constipation 07/15/2015   History of iron deficiency anemia 07/15/2015   Migraine without aura and without status migrainosus, not intractable 05/04/2015   Environmental and seasonal allergies 05/04/2015   Obesity (BMI 30.0-34.9) 05/04/2015   Plantar fasciitis of left foot 05/04/2015   Vitamin D deficiency 05/04/2015   Snoring 05/04/2015   GERD (gastroesophageal reflux disease) 05/04/2015    Past Surgical History:  Procedure Laterality Date   COLONOSCOPY WITH PROPOFOL N/A  12/30/2020   Procedure: COLONOSCOPY WITH PROPOFOL;  Surgeon: Jonathon Bellows, MD;  Location: [facility] ENDOSCOPY;  Service: Gastroenterology;  Laterality: N/A;   COLPOSCOPY     ENDOMETRIAL ABLATION     LEEP     TUBAL LIGATION      Family History  Problem Relation Age of Onset   Sleep apnea Mother    Sleep apnea Father    Allergic rhinitis Daughter    Allergic rhinitis Son    Breast cancer Paternal Grandmother 27   Brain cancer Paternal Grandmother    Breast cancer Maternal Aunt 80   Colon cancer Maternal Uncle    Lung cancer Maternal Uncle    Prostate cancer Maternal Uncle    Bone cancer Maternal Aunt     Social History   Tobacco Use   Smoking status: Never   Smokeless tobacco: Never  Substance Use Topics   Alcohol use: No    Alcohol/week: 0.0 standard drinks     Current Outpatient Medications:    cetirizine (ZYRTEC) 10 MG tablet, Take 10 mg by mouth daily., Disp: , Rfl:    Cholecalciferol (VITAMIN D) 50 MCG (2000 UT) CAPS, Take 1 capsule by mouth daily., Disp: , Rfl:    diclofenac (VOLTAREN) 75 MG EC tablet, Take 1 tablet (75 mg total) by mouth 2 (two) times daily as needed., Disp: 30 tablet, Rfl: 0   EPINEPHrine 0.3 mg/0.3 mL IJ SOAJ injection, Inject 0.3 mg into the  muscle as needed for anaphylaxis., Disp: 1 each, Rfl: 1   fluticasone (FLONASE) 50 MCG/ACT nasal spray, Place 2 sprays into both nostrils daily., Disp: 16 g, Rfl: 6   Multiple Vitamins-Minerals (MULTIVITAL) tablet, Take by mouth., Disp: , Rfl:    Na Sulfate-K Sulfate-Mg Sulf 17.5-3.13-1.6 GM/177ML SOLN, TAKE ACCORDING TO PREP INSTRUCTIONS FROM PROVIDER, Disp: 354 mL, Rfl: 0   omeprazole (PRILOSEC) 40 MG capsule, TAKE 1 CAPSULE (40 MG TOTAL) BY MOUTH DAILY., Disp: 30 capsule, Rfl: 5   Plecanatide (TRULANCE) 3 MG TABS, Take 1 tablet by mouth daily., Disp: 90 tablet, Rfl: 1   valACYclovir (VALTREX) 500 MG tablet, TAKE 1 TABLET BY MOUTH TWICE DAILY FOR 3 DAYS AS NEEDED FOR SYMPTOMS, Disp: 30 tablet, Rfl: 1  Allergies   Allergen Reactions   Peanut Butter Flavor Itching and Swelling   Shellfish Allergy Itching and Swelling   Sodium Hyaluronate (Avian)     Patient reported avian allergy    I personally reviewed active problem list, medication list, allergies, family history, social history, health maintenance with the patient/caregiver today.   ROS  Ten systems reviewed and is negative except as mentioned in HPI   Objective  Vitals:   07/20/21 1115  BP: 112/64  Pulse: 82  Resp: 16  Temp: 98.1 F (36.7 C)  SpO2: 98%  Weight: 185 lb (83.9 kg)  Height: 5\' 2"  (1.575 m)    Body mass index is 33.84 kg/m.  Physical Exam  Constitutional: Patient appears well-developed and well-nourished. Obese  No distress.  HEENT: head atraumatic, normocephalic, pupils equal and reactive to light, neck supple Cardiovascular: Normal rate, regular rhythm and normal heart sounds.  No murmur heard. No BLE edema. Pulmonary/Chest: Effort normal and breath sounds normal. No respiratory distress. Abdominal: Soft.  There is no tenderness. Muscular Skeletal: crepitus with extension of both knees  Psychiatric: Patient has a normal mood and affect. behavior is normal. Judgment and thought content normal.   PHQ2/9: Depression screen [facility] 2/9 07/20/2021 01/25/2021 01/22/2020 12/29/2019 07/09/2019  Decreased Interest 0 0 0 0 0  Down, Depressed, Hopeless 0 0 0 0 0  PHQ - 2 Score 0 0 0 0 0  Altered sleeping - - 0 3 0  Tired, decreased energy - - 0 3 2  Change in appetite - - 0 0 1  Feeling bad or failure about yourself  - - 0 0 0  Trouble concentrating - - 0 0 0  Moving slowly or fidgety/restless - - 0 0 0  Suicidal thoughts - - 0 0 0  PHQ-9 Score - - 0 6 3  Difficult doing work/chores - - Not difficult at all Not difficult at all Not difficult at all    phq 9 is negative   Fall Risk: Fall Risk  07/20/2021 01/25/2021 01/22/2020 12/29/2019 07/09/2019  Falls in the past year? 0 0 0 0 0  Number falls in past yr: 0 0 0 0 0  Injury  with Fall? 0 - 0 0 0  Risk for fall due to : No Fall Risks - - - -  Follow up Falls prevention discussed Falls prevention discussed - - -      Functional Status Survey: Is the patient deaf or have difficulty hearing?: No Does the patient have difficulty seeing, even when wearing glasses/contacts?: No Does the patient have difficulty concentrating, remembering, or making decisions?: No Does the patient have difficulty walking or climbing stairs?: No Does the patient have difficulty dressing or bathing?: No Does the  patient have difficulty doing errands alone such as visiting a doctor's office or shopping?: No    Assessment & Plan  1. OSA on CPAP  Compliant   2. Primary osteoarthritis of both knees   3. Gastroesophageal reflux disease without esophagitis  Controlled   4. Obesity (BMI 30-39.9)  We will try ozempic   5. Thrombocytosis  She would like to recheck it during her physical    6. Prediabetes   Ozempic sent in place of WEgovy due to nationwide shortage

## 2021-07-20 ENCOUNTER — Other Ambulatory Visit: Payer: Self-pay | Admitting: Family Medicine

## 2021-07-20 ENCOUNTER — Ambulatory Visit (INDEPENDENT_AMBULATORY_CARE_PROVIDER_SITE_OTHER): Payer: No Typology Code available for payment source | Admitting: Family Medicine

## 2021-07-20 ENCOUNTER — Other Ambulatory Visit: Payer: Self-pay

## 2021-07-20 ENCOUNTER — Encounter: Payer: Self-pay | Admitting: Family Medicine

## 2021-07-20 VITALS — BP 112/64 | HR 82 | Temp 98.1°F | Resp 16 | Ht 62.0 in | Wt 185.0 lb

## 2021-07-20 DIAGNOSIS — R7303 Prediabetes: Secondary | ICD-10-CM

## 2021-07-20 DIAGNOSIS — K219 Gastro-esophageal reflux disease without esophagitis: Secondary | ICD-10-CM | POA: Diagnosis not present

## 2021-07-20 DIAGNOSIS — G4733 Obstructive sleep apnea (adult) (pediatric): Secondary | ICD-10-CM

## 2021-07-20 DIAGNOSIS — D75839 Thrombocytosis, unspecified: Secondary | ICD-10-CM

## 2021-07-20 DIAGNOSIS — Z9989 Dependence on other enabling machines and devices: Secondary | ICD-10-CM

## 2021-07-20 DIAGNOSIS — M170 Bilateral primary osteoarthritis of knee: Secondary | ICD-10-CM | POA: Diagnosis not present

## 2021-07-20 DIAGNOSIS — E669 Obesity, unspecified: Secondary | ICD-10-CM

## 2021-07-20 MED ORDER — OZEMPIC (0.25 OR 0.5 MG/DOSE) 2 MG/1.5ML ~~LOC~~ SOPN
0.5000 mg | PEN_INJECTOR | SUBCUTANEOUS | 0 refills | Status: DC
Start: 2021-07-20 — End: 2021-09-12
  Filled 2021-07-20: qty 1.5, 28d supply, fill #0

## 2021-07-20 MED ORDER — WEGOVY 0.25 MG/0.5ML ~~LOC~~ SOAJ
0.2500 mg | SUBCUTANEOUS | 0 refills | Status: DC
  Filled 2021-07-20: qty 2, 28d supply, fill #0

## 2021-08-23 ENCOUNTER — Encounter: Payer: Self-pay | Admitting: Family Medicine

## 2021-08-23 ENCOUNTER — Telehealth (INDEPENDENT_AMBULATORY_CARE_PROVIDER_SITE_OTHER): Payer: No Typology Code available for payment source | Admitting: Family Medicine

## 2021-08-23 ENCOUNTER — Other Ambulatory Visit: Payer: Self-pay

## 2021-08-23 DIAGNOSIS — R059 Cough, unspecified: Secondary | ICD-10-CM | POA: Diagnosis not present

## 2021-08-23 DIAGNOSIS — B349 Viral infection, unspecified: Secondary | ICD-10-CM | POA: Diagnosis not present

## 2021-08-23 DIAGNOSIS — J029 Acute pharyngitis, unspecified: Secondary | ICD-10-CM | POA: Diagnosis not present

## 2021-08-23 LAB — POCT INFLUENZA A/B
Influenza A, POC: NEGATIVE
Influenza B, POC: NEGATIVE

## 2021-08-23 MED ORDER — BENZONATATE 100 MG PO CAPS
100.0000 mg | ORAL_CAPSULE | Freq: Three times a day (TID) | ORAL | 0 refills | Status: DC | PRN
  Filled 2021-08-23: qty 60, 10d supply, fill #0

## 2021-08-23 MED ORDER — FLUTICASONE PROPIONATE 50 MCG/ACT NA SUSP
2.0000 | Freq: Every day | NASAL | 0 refills | Status: DC
  Filled 2021-08-23: qty 16, 30d supply, fill #0

## 2021-08-23 NOTE — Progress Notes (Signed)
Name: Lori Beltran   MRN: [id number]    DOB: 1975-11-19   Date:08/23/2021       Progress Note  Subjective  Chief Complaint  Sore Throat/ Cough  I connected with  Karene Fry  on 08/23/21 at 11:40 AM EDT by a video enabled telemedicine application and verified that I am speaking with the correct person using two identifiers.  I discussed the limitations of evaluation and management by telemedicine and the availability of in person appointments. The patient expressed understanding and agreed to proceed with the virtual visit  Staff also discussed with the patient that there may be a patient responsible charge related to this service. Patient Location: in her car/parked  Provider Location: [facility]  Additional Individuals present: alone   HPI  Viral Illness: patient states symptoms started 08/21/21. Symptoms started with a wet cough, followed by fatigue, followed by sore throat, ear pain, nasal congestion, rhinorrhea, no fever or chills. She and has mild SOB with activity. She has been taking Nyquil. She states fatigue is the same but cough is worse today .   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/2015   Chronic constipation 07/15/2015   History of iron deficiency anemia 07/15/2015   Migraine without aura and without status migrainosus, not intractable 05/04/2015   Environmental and seasonal allergies 05/04/2015   Obesity (BMI 30.0-34.9) 05/04/2015   Plantar fasciitis of left foot 05/04/2015   Vitamin D deficiency 05/04/2015   Snoring 05/04/2015   GERD (gastroesophageal reflux disease) 05/04/2015    Past Surgical History:  Procedure Laterality Date   COLONOSCOPY WITH PROPOFOL N/A 12/30/2020   Procedure: COLONOSCOPY WITH PROPOFOL;  Surgeon: Jonathon Bellows, MD;  Location: [facility] ENDOSCOPY;   Service: Gastroenterology;  Laterality: N/A;   COLPOSCOPY     ENDOMETRIAL ABLATION     LEEP     TUBAL LIGATION      Family History  Problem Relation Age of Onset   Sleep apnea Mother    Sleep apnea Father    Allergic rhinitis Daughter    Allergic rhinitis Son    Breast cancer Paternal Grandmother 27   Brain cancer Paternal Grandmother    Breast cancer Maternal Aunt 80   Colon cancer Maternal Uncle    Lung cancer Maternal Uncle    Prostate cancer Maternal Uncle    Bone cancer Maternal Aunt       Current Outpatient Medications:    cetirizine (ZYRTEC) 10 MG tablet, Take 10 mg by mouth daily., Disp: , Rfl:    Cholecalciferol (VITAMIN D) 50 MCG (2000 UT) CAPS, Take 1 capsule by mouth daily., Disp: , Rfl:    diclofenac (VOLTAREN) 75 MG EC tablet, Take 1 tablet (75 mg total) by mouth 2 (two) times daily as needed., Disp: 30 tablet, Rfl: 0   EPINEPHrine 0.3 mg/0.3 mL IJ SOAJ injection, Inject 0.3 mg into the muscle as needed for anaphylaxis., Disp: 1 each, Rfl: 1   fluticasone (FLONASE) 50 MCG/ACT nasal spray, Place 2 sprays into both nostrils daily., Disp: 16 g, Rfl: 6   Multiple Vitamins-Minerals (MULTIVITAL) tablet, Take by mouth., Disp: , Rfl:    Na Sulfate-K Sulfate-Mg Sulf 17.5-3.13-1.6 GM/177ML SOLN, TAKE ACCORDING TO PREP INSTRUCTIONS FROM PROVIDER, Disp: 354 mL, Rfl: 0   omeprazole (PRILOSEC) 40 MG capsule, TAKE 1 CAPSULE (  40 MG TOTAL) BY MOUTH DAILY., Disp: 30 capsule, Rfl: 5   Plecanatide (TRULANCE) 3 MG TABS, Take 1 tablet by mouth daily., Disp: 90 tablet, Rfl: 1   Semaglutide,0.25 or 0.5MG /DOS, (OZEMPIC, 0.25 OR 0.5 MG/DOSE,) 2 MG/1.5ML SOPN, Inject 0.5 mg into the skin once a week., Disp: 1.5 mL, Rfl: 0   valACYclovir (VALTREX) 500 MG tablet, TAKE 1 TABLET BY MOUTH TWICE DAILY FOR 3 DAYS AS NEEDED FOR SYMPTOMS, Disp: 30 tablet, Rfl: 1  Allergies  Allergen Reactions   Peanut Butter Flavor Itching and Swelling   Shellfish Allergy Itching and Swelling   Sodium Hyaluronate  (Avian)     Patient reported avian allergy    I personally reviewed active problem list, medication list, allergies, family history, social history, health maintenance with the patient/caregiver today.   ROS  Ten systems reviewed and is negative except as mentioned in HPI   Objective  Virtual encounter, vitals not obtained.  There is no height or weight on file to calculate BMI.  Physical Exam  Awake, alert and oriented   PHQ2/9: Depression screen [facility] 2/9 08/23/2021 07/20/2021 01/25/2021 01/22/2020 12/29/2019  Decreased Interest 0 0 0 0 0  Down, Depressed, Hopeless 0 0 0 0 0  PHQ - 2 Score 0 0 0 0 0  Altered sleeping 0 - - 0 3  Tired, decreased energy 0 - - 0 3  Change in appetite 0 - - 0 0  Feeling bad or failure about yourself  0 - - 0 0  Trouble concentrating 0 - - 0 0  Moving slowly or fidgety/restless 0 - - 0 0  Suicidal thoughts 0 - - 0 0  PHQ-9 Score 0 - - 0 6  Difficult doing work/chores - - - Not difficult at all Not difficult at all  Some recent data might be hidden   PHQ-2/9 Result is negative.    Fall Risk: Fall Risk  08/23/2021 07/20/2021 01/25/2021 01/22/2020 12/29/2019  Falls in the past year? 0 0 0 0 0  Number falls in past yr: 0 0 0 0 0  Injury with Fall? 0 0 - 0 0  Risk for fall due to : No Fall Risks No Fall Risks - - -  Follow up Falls prevention discussed Falls prevention discussed Falls prevention discussed - -     Assessment & Plan  1. Sore throat  - POCT Influenza A/B  2. Cough, unspecified type  - POCT Influenza A/B  3. Viral illness   She had COVID-19 test done for work   I discussed the assessment and treatment plan with the patient. The patient was provided an opportunity to ask questions and all were answered. The patient agreed with the plan and demonstrated an understanding of the instructions.  The patient was advised to call back or seek an in-person evaluation if the symptoms worsen or if the condition fails to improve as  anticipated.  I provided 15  minutes of non-face-to-face time during this encounter.

## 2021-08-25 ENCOUNTER — Ambulatory Visit
Admission: RE | Admit: 2021-08-25 | Discharge: 2021-08-25 | Disposition: A | Discharge status: Home / Self Care | Payer: No Typology Code available for payment source | Source: Ambulatory Visit | Attending: Family Medicine | Admitting: Family Medicine

## 2021-08-25 ENCOUNTER — Other Ambulatory Visit: Payer: Self-pay | Admitting: Family Medicine

## 2021-08-25 ENCOUNTER — Ambulatory Visit
Admission: RE | Admit: 2021-08-25 | Discharge: 2021-08-25 | Disposition: A | Discharge status: Home / Self Care | Payer: No Typology Code available for payment source | Attending: Family Medicine | Admitting: Family Medicine

## 2021-08-25 ENCOUNTER — Telehealth: Payer: Self-pay | Admitting: Emergency Medicine

## 2021-08-25 ENCOUNTER — Other Ambulatory Visit
Admission: RE | Admit: 2021-08-25 | Discharge: 2021-08-25 | Disposition: A | Discharge status: Home / Self Care | Payer: No Typology Code available for payment source | Source: Home / Self Care | Attending: Family Medicine | Admitting: Family Medicine

## 2021-08-25 ENCOUNTER — Other Ambulatory Visit: Payer: Self-pay | Admitting: Emergency Medicine

## 2021-08-25 DIAGNOSIS — B349 Viral infection, unspecified: Secondary | ICD-10-CM

## 2021-08-25 DIAGNOSIS — R0989 Other specified symptoms and signs involving the circulatory and respiratory systems: Secondary | ICD-10-CM

## 2021-08-25 DIAGNOSIS — J029 Acute pharyngitis, unspecified: Secondary | ICD-10-CM

## 2021-08-25 DIAGNOSIS — R059 Cough, unspecified: Secondary | ICD-10-CM

## 2021-08-25 LAB — COMPREHENSIVE METABOLIC PANEL
ALT: 13 U/L (ref 0–44)
AST: 14 U/L — ABNORMAL LOW (ref 15–41)
Albumin: 4 g/dL (ref 3.5–5.0)
Alkaline Phosphatase: 75 U/L (ref 38–126)
Anion gap: 10 (ref 5–15)
BUN: 13 mg/dL (ref 6–20)
CO2: 24 mmol/L (ref 22–32)
Calcium: 8.9 mg/dL (ref 8.9–10.3)
Chloride: 105 mmol/L (ref 98–111)
Creatinine, Ser: 0.89 mg/dL (ref 0.44–1.00)
GFR, Estimated: >60 mL/min (ref >60)
Glucose, Bld: 96 mg/dL (ref 70–99)
Potassium: 3.6 mmol/L (ref 3.5–5.1)
Sodium: 139 mmol/L (ref 135–145)
Total Bilirubin: 0.8 mg/dL (ref 0.3–1.2)
Total Protein: 7.3 g/dL (ref 6.5–8.1)

## 2021-08-25 MED ORDER — AZITHROMYCIN 250 MG PO TABS
ORAL_TABLET | ORAL | 0 refills | Status: AC
Start: 2021-08-25 — End: 2021-08-30

## 2021-08-25 NOTE — Telephone Encounter (Signed)
Patient called and stated she was feeling worse. She stated she has a terrible cough, massive headache and she is freezing but no fever, ears feels full , facial pressure and SOB on exertion. Please advise

## 2021-08-25 NOTE — Telephone Encounter (Signed)
Done, patient notified to got to hospital so we can get results back today

## 2021-08-26 ENCOUNTER — Other Ambulatory Visit: Payer: Self-pay | Admitting: Family Medicine

## 2021-08-26 DIAGNOSIS — R918 Other nonspecific abnormal finding of lung field: Secondary | ICD-10-CM

## 2021-08-26 DIAGNOSIS — R9389 Abnormal findings on diagnostic imaging of other specified body structures: Secondary | ICD-10-CM

## 2021-08-28 ENCOUNTER — Ambulatory Visit: Payer: Self-pay | Admitting: *Deleted

## 2021-08-28 NOTE — Telephone Encounter (Signed)
'  Linus Orn' with [facility] calling with CXR reports fro 08/25/21. Report available in Epic:   IMPRESSION: Small left lower lung nodular opacity. Chest CT without contrast is recommended to exclude pulmonary neoplasm.   Assured NT would route to practice for PCPs review.  NOted addressed by Dr. Ancil Boozer 08/26/21

## 2021-09-05 ENCOUNTER — Encounter: Payer: Self-pay | Admitting: Family Medicine

## 2021-09-11 NOTE — Progress Notes (Signed)
Name: Lori Beltran   MRN: [id number]    DOB: [birth year]   Date:09/12/2021       Progress Note  Subjective  Chief Complaint  Weight Loss  HPI  Prediabetes  she is cutting down on sweets, she denies polyphagia, polydipsia or polyuria. Last A1C was 6 %, she wants to hold off on repeating labs during her CPE    Obesity: she states her weight when she graduated HS was 120 lbs, She states after birth of her first child her weight stays around 150 lbs, she states after third she started to have problems staying around 150 lbs. She has tried [employer] but only lost 6 lbs, she is always conscious about portion control , unable to do a lot of physical activity due to knee OA . She would like to try medications again. She tried Contrave in the past but it caused nausea and stopped. Discussed Qsymia but she asked for [facility], insurance approved Ozempic and she has lost 10 lbs in the past 2 months and denies side effects of medications. 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/2015   Chronic constipation 07/15/2015   History of iron deficiency anemia 07/15/2015   Migraine without aura and without status migrainosus, not intractable 05/04/2015   Environmental and seasonal allergies 05/04/2015   Obesity (BMI 30.0-34.9) 05/04/2015   Plantar fasciitis of left foot 05/04/2015   Vitamin D deficiency 05/04/2015   Snoring 05/04/2015   GERD (gastroesophageal reflux disease) 05/04/2015    Past Surgical History:  Procedure Laterality Date   COLONOSCOPY WITH PROPOFOL N/A 12/30/2020   Procedure: COLONOSCOPY WITH PROPOFOL;  Surgeon: Jonathon Bellows, MD;  Location: [facility] ENDOSCOPY;  Service: Gastroenterology;  Laterality: N/A;   COLPOSCOPY      ENDOMETRIAL ABLATION     LEEP     TUBAL LIGATION      Family History  Problem Relation Age of Onset   Sleep apnea Mother    Sleep apnea Father    Allergic rhinitis Daughter    Allergic rhinitis Son    Breast cancer Paternal Grandmother 50   Brain cancer Paternal Grandmother    Breast cancer Maternal Aunt 80   Colon cancer Maternal Uncle    Lung cancer Maternal Uncle    Prostate cancer Maternal Uncle    Bone cancer Maternal Aunt     Social History   Tobacco Use   Smoking status: Never   Smokeless tobacco: Never  Substance Use Topics   Alcohol use: No    Alcohol/week: 0.0 standard drinks     Current Outpatient Medications:    benzonatate (TESSALON) 100 MG capsule, Take 1-2 capsules (100-200 mg total) by mouth 3 (three) times daily as needed for cough., Disp: 60 capsule, Rfl: 0   cetirizine (ZYRTEC) 10 MG tablet, Take 10 mg by mouth daily., Disp: , Rfl:    Cholecalciferol (VITAMIN D) 50 MCG (2000 UT) CAPS, Take 1 capsule by mouth daily., Disp: , Rfl:    diclofenac (VOLTAREN) 75 MG EC tablet, Take 1 tablet (75 mg total) by mouth 2 (two) times daily as needed., Disp: 30 tablet, Rfl: 0   EPINEPHrine 0.3 mg/0.3 mL IJ SOAJ injection, Inject 0.3 mg into  the muscle as needed for anaphylaxis., Disp: 1 each, Rfl: 1   fluticasone (FLONASE) 50 MCG/ACT nasal spray, Place 2 sprays into both nostrils daily., Disp: 16 g, Rfl: 0   Multiple Vitamins-Minerals (MULTIVITAL) tablet, Take by mouth., Disp: , Rfl:    omeprazole (PRILOSEC) 40 MG capsule, TAKE 1 CAPSULE (40 MG TOTAL) BY MOUTH DAILY., Disp: 30 capsule, Rfl: 5   Plecanatide (TRULANCE) 3 MG TABS, Take 1 tablet by mouth daily., Disp: 90 tablet, Rfl: 1   Semaglutide,0.25 or 0.5MG /DOS, (OZEMPIC, 0.25 OR 0.5 MG/DOSE,) 2 MG/1.5ML SOPN, Inject 0.5 mg into the skin once a week., Disp: 1.5 mL, Rfl: 0   valACYclovir (VALTREX) 500 MG tablet, TAKE 1 TABLET BY MOUTH TWICE DAILY FOR 3 DAYS AS NEEDED FOR SYMPTOMS, Disp: 30 tablet, Rfl: 1  Allergies   Allergen Reactions   Peanut Butter Flavor Itching and Swelling   Shellfish Allergy Itching and Swelling   Sodium Hyaluronate (Avian)     Patient reported avian allergy    I personally reviewed active problem list, medication list, allergies, family history, social history, health maintenance with the patient/caregiver today.   ROS  Ten systems reviewed and is negative except as mentioned in HPI   Objective  Vitals:   09/12/21 1428  BP: 128/72  Pulse: 85  Resp: 16  SpO2: 97%  Weight: 175 lb (79.4 kg)  Height: 5\' 2"  (1.575 m)    Body mass index is 32.01 kg/m.  Physical Exam  Constitutional: Patient appears well-developed and well-nourished. Obese  No distress.  HEENT: head atraumatic, normocephalic, pupils equal and reactive to light, neck supple Cardiovascular: Normal rate, regular rhythm and normal heart sounds.  No murmur heard. No BLE edema. Pulmonary/Chest: Effort normal and breath sounds normal. No respiratory distress. Abdominal: Soft.  There is no tenderness. Psychiatric: Patient has a normal mood and affect. behavior is normal. Judgment and thought content normal.   Recent Results (from the past 2160 hour(s))  POCT Influenza A/B     Status: None   Collection Time: 08/23/21  9:37 AM  Result Value Ref Range   Influenza A, POC Negative Negative   Influenza B, POC Negative Negative  Comprehensive metabolic panel     Status: Abnormal   Collection Time: 08/25/21  1:59 PM  Result Value Ref Range   Sodium 139 135 - 145 mmol/L   Potassium 3.6 3.5 - 5.1 mmol/L   Chloride 105 98 - 111 mmol/L   CO2 24 22 - 32 mmol/L   Glucose, Bld 96 70 - 99 mg/dL    Comment: Glucose reference range applies only to samples taken after fasting for at least 8 hours.   BUN 13 6 - 20 mg/dL   Creatinine, Ser 0.89 0.44 - 1.00 mg/dL   Calcium 8.9 8.9 - 10.3 mg/dL   Total Protein 7.3 6.5 - 8.1 g/dL   Albumin 4.0 3.5 - 5.0 g/dL   AST 14 (L) 15 - 41 U/L   ALT 13 0 - 44 U/L   Alkaline  Phosphatase 75 38 - 126 U/L   Total Bilirubin 0.8 0.3 - 1.2 mg/dL   GFR, Estimated >60 >60 mL/min    Comment: (NOTE) Calculated using the CKD-EPI Creatinine Equation (2021)    Anion gap 10 5 - 15    Comment: Performed at [facility], [address], [place], [place] [address]     PHQ2/9: Depression screen [facility] 2/9 09/12/2021 08/23/2021 07/20/2021 01/25/2021 01/22/2020  Decreased Interest 0 0 0 0 0  Down,  Depressed, Hopeless 0 0 0 0 0  PHQ - 2 Score 0 0 0 0 0  Altered sleeping 0 0 - - 0  Tired, decreased energy 0 0 - - 0  Change in appetite 0 0 - - 0  Feeling bad or failure about yourself  0 0 - - 0  Trouble concentrating 0 0 - - 0  Moving slowly or fidgety/restless 0 0 - - 0  Suicidal thoughts 0 0 - - 0  PHQ-9 Score 0 0 - - 0  Difficult doing work/chores - - - - Not difficult at all  Some recent data might be hidden    phq 9 is negative   Fall Risk: Fall Risk  09/12/2021 08/23/2021 07/20/2021 01/25/2021 01/22/2020  Falls in the past year? 0 0 0 0 0  Number falls in past yr: 0 0 0 0 0  Injury with Fall? 0 0 0 - 0  Risk for fall due to : No Fall Risks No Fall Risks No Fall Risks - -  Follow up Falls prevention discussed Falls prevention discussed Falls prevention discussed Falls prevention discussed -      Functional Status Survey: Is the patient deaf or have difficulty hearing?: No Does the patient have difficulty seeing, even when wearing glasses/contacts?: No Does the patient have difficulty concentrating, remembering, or making decisions?: No Does the patient have difficulty walking or climbing stairs?: No Does the patient have difficulty dressing or bathing?: No Does the patient have difficulty doing errands alone such as visiting a doctor's office or shopping?: No    Assessment & Plan  1. Obesity (BMI 30-39.9)  - Semaglutide-Weight Management (WEGOVY) 1 MG/0.5ML SOAJ; Inject 1 mg into the skin once a week.  Dispense: 6 mL; Refill: 0  2. Pre-diabetes  -  Semaglutide-Weight Management (WEGOVY) 1 MG/0.5ML SOAJ; Inject 1 mg into the skin once a week.  Dispense: 6 mL; Refill: 0

## 2021-09-12 ENCOUNTER — Encounter: Payer: Self-pay | Admitting: Family Medicine

## 2021-09-12 ENCOUNTER — Other Ambulatory Visit: Payer: Self-pay

## 2021-09-12 ENCOUNTER — Ambulatory Visit (INDEPENDENT_AMBULATORY_CARE_PROVIDER_SITE_OTHER): Payer: No Typology Code available for payment source | Admitting: Family Medicine

## 2021-09-12 VITALS — BP 128/72 | HR 85 | Resp 16 | Ht 62.0 in | Wt 175.0 lb

## 2021-09-12 DIAGNOSIS — R7303 Prediabetes: Secondary | ICD-10-CM | POA: Diagnosis not present

## 2021-09-12 DIAGNOSIS — E669 Obesity, unspecified: Secondary | ICD-10-CM

## 2021-09-12 MED ORDER — WEGOVY 1 MG/0.5ML ~~LOC~~ SOAJ
1.0000 mg | SUBCUTANEOUS | 0 refills | Status: DC
  Filled 2021-09-12: qty 6, fill #0

## 2021-09-13 ENCOUNTER — Other Ambulatory Visit: Payer: Self-pay | Admitting: Family Medicine

## 2021-09-13 ENCOUNTER — Other Ambulatory Visit: Payer: Self-pay | Admitting: Emergency Medicine

## 2021-09-13 ENCOUNTER — Other Ambulatory Visit: Payer: Self-pay

## 2021-09-13 DIAGNOSIS — R7303 Prediabetes: Secondary | ICD-10-CM

## 2021-09-13 MED ORDER — SEMAGLUTIDE (1 MG/DOSE) 4 MG/3ML ~~LOC~~ SOPN
1.0000 mg | PEN_INJECTOR | SUBCUTANEOUS | 0 refills | Status: DC
  Filled 2021-09-13: qty 3, 28d supply, fill #0
  Filled 2021-10-11: qty 9, 84d supply, fill #1

## 2021-09-25 ENCOUNTER — Ambulatory Visit: Payer: No Typology Code available for payment source | Attending: Family Medicine

## 2021-10-11 ENCOUNTER — Other Ambulatory Visit: Payer: Self-pay

## 2021-10-12 ENCOUNTER — Encounter: Payer: Self-pay | Admitting: Family Medicine

## 2021-10-12 ENCOUNTER — Other Ambulatory Visit: Payer: Self-pay

## 2021-10-12 ENCOUNTER — Ambulatory Visit (INDEPENDENT_AMBULATORY_CARE_PROVIDER_SITE_OTHER): Payer: No Typology Code available for payment source | Admitting: Family Medicine

## 2021-10-12 VITALS — BP 116/76 | HR 89 | Temp 98.2°F | Resp 16 | Ht 62.0 in | Wt 174.7 lb

## 2021-10-12 DIAGNOSIS — R29898 Other symptoms and signs involving the musculoskeletal system: Secondary | ICD-10-CM

## 2021-10-12 DIAGNOSIS — M25462 Effusion, left knee: Secondary | ICD-10-CM

## 2021-10-12 DIAGNOSIS — M170 Bilateral primary osteoarthritis of knee: Secondary | ICD-10-CM

## 2021-10-12 NOTE — Progress Notes (Signed)
Name: Lori Beltran   MRN: [id number]    DOB: October 29, 1975   Date:10/12/2021       Progress Note  Subjective  Chief Complaint  Chief Complaint  Patient presents with   Knee Pain    HPI  Left knee pain: she has a history of knee pain due to OA, however this morning she woke up and felt a pop sensation when she stepped out of bed followed by intense pain and effusion. 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/2015   Chronic constipation 07/15/2015   History of iron deficiency anemia 07/15/2015   Migraine without aura and without status migrainosus, not intractable 05/04/2015   Environmental and seasonal allergies 05/04/2015   Obesity (BMI 30.0-34.9) 05/04/2015   Plantar fasciitis of left foot 05/04/2015   Vitamin D deficiency 05/04/2015   Snoring 05/04/2015   GERD (gastroesophageal reflux disease) 05/04/2015    Social History   Tobacco Use   Smoking status: Never   Smokeless tobacco: Never  Substance Use Topics   Alcohol use: No    Alcohol/week: 0.0 standard drinks     Current Outpatient Medications:    cetirizine (ZYRTEC) 10 MG tablet, Take 10 mg by mouth daily., Disp: , Rfl:    Cholecalciferol (VITAMIN D) 50 MCG (2000 UT) CAPS, Take 1 capsule by mouth daily., Disp: , Rfl:    diclofenac (VOLTAREN) 75 MG EC tablet, Take 1 tablet (75 mg total) by mouth 2 (two) times daily as needed., Disp: 30 tablet, Rfl: 0   EPINEPHrine 0.3 mg/0.3 mL IJ SOAJ injection, Inject 0.3 mg into the muscle as needed for anaphylaxis., Disp: 1 each, Rfl: 1   fluticasone (FLONASE) 50 MCG/ACT nasal spray, Place 2 sprays into both nostrils daily., Disp: 16 g, Rfl: 0   Multiple Vitamins-Minerals  (MULTIVITAL) tablet, Take by mouth., Disp: , Rfl:    omeprazole (PRILOSEC) 40 MG capsule, TAKE 1 CAPSULE (40 MG TOTAL) BY MOUTH DAILY., Disp: 30 capsule, Rfl: 5   Plecanatide (TRULANCE) 3 MG TABS, Take 1 tablet by mouth daily., Disp: 90 tablet, Rfl: 1   Semaglutide, 1 MG/DOSE, 4 MG/3ML SOPN, Inject 1 mg as directed once a week., Disp: 12 mL, Rfl: 0   valACYclovir (VALTREX) 500 MG tablet, TAKE 1 TABLET BY MOUTH TWICE DAILY FOR 3 DAYS AS NEEDED FOR SYMPTOMS, Disp: 30 tablet, Rfl: 1  Allergies  Allergen Reactions   Peanut Butter Flavor Itching and Swelling   Shellfish Allergy Itching and Swelling   Sodium Hyaluronate (Avian)     Patient reported avian allergy    ROS  Ten systems reviewed and is negative except as mentioned in HPI   Objective  Vitals:   10/12/21 1226  BP: 116/76  Pulse: 89  Resp: 16  Temp: 98.2 F (36.8 C)  TempSrc: Oral  SpO2: 98%  Weight: 174 lb 11.2 oz (79.2 kg)  Height: 5\' 2"  (1.575 m)    Body mass index is 31.95 kg/m.    Physical Exam  Constitutional: Patient appears well-developed and well-nourished. Obese  No distress.  HEENT: head atraumatic, normocephalic, pupils equal and reactive to light, neck supple Cardiovascular: Normal rate, regular rhythm and normal heart  sounds.  No murmur heard. No BLE edema. Pulmonary/Chest: Effort normal and breath sounds normal. No respiratory distress. Abdominal: Soft.  There is no tenderness. Muscular Skeletal: left knee effusion, pain during palpation of medial aspect, decrease rom.  Psychiatric: Patient has a normal mood and affect. behavior is normal. Judgment and thought content normal.   Recent Results (from the past 2160 hour(s))  POCT Influenza A/B     Status: None   Collection Time: 08/23/21  9:37 AM  Result Value Ref Range   Influenza A, POC Negative Negative   Influenza B, POC Negative Negative  Comprehensive metabolic panel     Status: Abnormal   Collection Time: 08/25/21  1:59 PM  Result Value  Ref Range   Sodium 139 135 - 145 mmol/L   Potassium 3.6 3.5 - 5.1 mmol/L   Chloride 105 98 - 111 mmol/L   CO2 24 22 - 32 mmol/L   Glucose, Bld 96 70 - 99 mg/dL    Comment: Glucose reference range applies only to samples taken after fasting for at least 8 hours.   BUN 13 6 - 20 mg/dL   Creatinine, Ser 0.89 0.44 - 1.00 mg/dL   Calcium 8.9 8.9 - 10.3 mg/dL   Total Protein 7.3 6.5 - 8.1 g/dL   Albumin 4.0 3.5 - 5.0 g/dL   AST 14 (L) 15 - 41 U/L   ALT 13 0 - 44 U/L   Alkaline Phosphatase 75 38 - 126 U/L   Total Bilirubin 0.8 0.3 - 1.2 mg/dL   GFR, Estimated >60 >60 mL/min    Comment: (NOTE) Calculated using the CKD-EPI Creatinine Equation (2021)    Anion gap 10 5 - 15    Comment: Performed at [facility], [address], [place],  [address]     Assessment & Plan   1. Effusion of left knee  - MR Knee Left  Wo Contrast; Future Possible ligament tear   2. Popping sound of knee joint  - MR Knee Left  Wo Contrast; Future   3. Primary osteoarthritis of both knees

## 2021-10-13 ENCOUNTER — Ambulatory Visit: Payer: No Typology Code available for payment source | Admitting: Family Medicine

## 2021-10-17 ENCOUNTER — Ambulatory Visit
Admission: RE | Admit: 2021-10-17 | Discharge: 2021-10-17 | Disposition: A | Discharge status: Home / Self Care | Payer: No Typology Code available for payment source | Source: Ambulatory Visit | Attending: Family Medicine | Admitting: Family Medicine

## 2021-10-17 ENCOUNTER — Other Ambulatory Visit: Payer: Self-pay | Admitting: Family Medicine

## 2021-10-17 ENCOUNTER — Other Ambulatory Visit: Payer: Self-pay

## 2021-10-17 DIAGNOSIS — M25462 Effusion, left knee: Secondary | ICD-10-CM | POA: Insufficient documentation

## 2021-10-17 DIAGNOSIS — S83242D Other tear of medial meniscus, current injury, left knee, subsequent encounter: Secondary | ICD-10-CM

## 2021-10-17 DIAGNOSIS — R29898 Other symptoms and signs involving the musculoskeletal system: Secondary | ICD-10-CM | POA: Diagnosis present

## 2021-10-17 DIAGNOSIS — M170 Bilateral primary osteoarthritis of knee: Secondary | ICD-10-CM

## 2021-10-19 ENCOUNTER — Other Ambulatory Visit: Payer: Self-pay

## 2021-10-19 ENCOUNTER — Encounter: Payer: Self-pay | Admitting: Family Medicine

## 2021-10-19 ENCOUNTER — Other Ambulatory Visit: Payer: Self-pay | Admitting: Family Medicine

## 2021-10-19 DIAGNOSIS — M25462 Effusion, left knee: Secondary | ICD-10-CM

## 2021-10-19 DIAGNOSIS — M170 Bilateral primary osteoarthritis of knee: Secondary | ICD-10-CM

## 2021-10-19 MED ORDER — TRAMADOL HCL 50 MG PO TABS
50.0000 mg | ORAL_TABLET | Freq: Three times a day (TID) | ORAL | 0 refills | Status: AC | PRN
  Filled 2021-10-19: qty 15, 5d supply, fill #0

## 2021-10-19 MED ORDER — DICLOFENAC SODIUM 75 MG PO TBEC
75.0000 mg | DELAYED_RELEASE_TABLET | Freq: Two times a day (BID) | ORAL | 0 refills | Status: DC | PRN
  Filled 2021-10-19: qty 30, 15d supply, fill #0

## 2021-10-20 ENCOUNTER — Other Ambulatory Visit: Payer: Self-pay

## 2021-10-20 MED ORDER — METHYLPREDNISOLONE 4 MG PO TBPK
ORAL_TABLET | ORAL | 0 refills | Status: DC
  Filled 2021-10-20: qty 21, 6d supply, fill #0

## 2021-10-24 ENCOUNTER — Telehealth: Payer: Self-pay

## 2021-10-24 NOTE — Telephone Encounter (Signed)
Copied from [place] [id number]. Topic: General - Other >> Oct 24, 2021  9:47 AM Antonieta Iba C wrote: Reason for CRM: Melissa with pre-service center is calling in for assistance. Pt has imaging scheduled for Thursday, Lenna Sciara says that imaging needs authorizations. Showing authorization expired in Dec.   CB: [place]

## 2021-10-26 ENCOUNTER — Ambulatory Visit: Payer: No Typology Code available for payment source

## 2021-10-27 ENCOUNTER — Other Ambulatory Visit: Payer: Self-pay

## 2021-10-27 ENCOUNTER — Ambulatory Visit
Admission: RE | Admit: 2021-10-27 | Discharge: 2021-10-27 | Disposition: A | Discharge status: Home / Self Care | Payer: No Typology Code available for payment source | Attending: Pediatrics | Admitting: Pediatrics

## 2021-10-27 ENCOUNTER — Ambulatory Visit
Admission: RE | Admit: 2021-10-27 | Discharge: 2021-10-27 | Disposition: A | Discharge status: Home / Self Care | Payer: No Typology Code available for payment source | Source: Ambulatory Visit | Attending: Nurse Practitioner | Admitting: Nurse Practitioner

## 2021-10-27 ENCOUNTER — Ambulatory Visit (INDEPENDENT_AMBULATORY_CARE_PROVIDER_SITE_OTHER): Payer: No Typology Code available for payment source | Admitting: Nurse Practitioner

## 2021-10-27 ENCOUNTER — Encounter: Payer: Self-pay | Admitting: Nurse Practitioner

## 2021-10-27 VITALS — BP 118/76 | HR 90 | Temp 98.3°F | Resp 16 | Ht 62.0 in | Wt 171.9 lb

## 2021-10-27 DIAGNOSIS — W19XXXA Unspecified fall, initial encounter: Secondary | ICD-10-CM

## 2021-10-27 DIAGNOSIS — S6991XA Unspecified injury of right wrist, hand and finger(s), initial encounter: Secondary | ICD-10-CM | POA: Diagnosis not present

## 2021-10-27 DIAGNOSIS — W108XXA Fall (on) (from) other stairs and steps, initial encounter: Secondary | ICD-10-CM | POA: Diagnosis not present

## 2021-10-27 DIAGNOSIS — M79641 Pain in right hand: Secondary | ICD-10-CM | POA: Diagnosis present

## 2021-10-27 DIAGNOSIS — Y92008 Other place in unspecified non-institutional (private) residence as the place of occurrence of the external cause: Secondary | ICD-10-CM | POA: Diagnosis not present

## 2021-10-27 NOTE — Progress Notes (Signed)
BP 118/76    Pulse 90    Temp 98.3 F (36.8 C) (Oral)    Resp 16    Ht 5\' 2"  (1.575 m)    Wt 171 lb 14.4 oz (78 kg)    SpO2 98%    BMI 31.44 kg/m    Subjective:    Patient ID: Lori Beltran, female    DOB: August 31, 1976, 46 y.o.   MRN: [id number]  HPI: Lori Beltran is a 46 y.o. female  Chief Complaint  Patient presents with   Hand Pain   Right hand and right fourth digit pain: She says that on Sunday she was walking down the stairs, reached for the hand rail and fell through.  She says she was about five steps up.  She says she fell on grass.  She says she had a scratch on the back of her head, and contusions to her right leg.  She denies any loss of consciousness.  She says her right hand hurts and her right fourth digit (ring finger) is painful and seems to be resting on her pinky. She says she can move her hand and make a fist but it hurts.  She does have tenderness to the dorsum of the hand and the fourth digit.  She denies any tenderness to the wrist. She has been taking ibuprofen for pain.  Discussed ice therapy.  Will get an xray and go from there.    Relevant past medical, surgical, family and social history reviewed and updated as indicated. Interim medical history since our last visit reviewed. Allergies and medications reviewed and updated.  Review of Systems  Constitutional: Negative for fever or weight change.  Respiratory: Negative for cough and shortness of breath.   Cardiovascular: Negative for chest pain or palpitations.  Gastrointestinal: Negative for abdominal pain, no bowel changes.  Musculoskeletal: Negative for gait problem or joint swelling. Positive for right hand pain  Skin: Negative for rash.  Neurological: Negative for dizziness or headache.  No other specific complaints in a complete review of systems (except as listed in HPI above).      Objective:    BP 118/76    Pulse 90    Temp 98.3 F (36.8 C) (Oral)    Resp 16    Ht 5\' 2"  (1.575 m)    Wt 171  lb 14.4 oz (78 kg)    SpO2 98%    BMI 31.44 kg/m   Wt Readings from Last 3 Encounters:  10/27/21 171 lb 14.4 oz (78 kg)  10/12/21 174 lb 11.2 oz (79.2 kg)  09/12/21 175 lb (79.4 kg)    Physical Exam  Constitutional: Patient appears well-developed and well-nourished. No distress.  HEENT: head atraumatic, normocephalic, pupils equal and reactive to light, neck supple Cardiovascular: Normal rate, regular rhythm and normal heart sounds.  No murmur heard. No BLE edema. Pulmonary/Chest: Effort normal and breath sounds normal. No respiratory distress. Abdominal: Soft.  There is no tenderness. Musculoskeletal: right hand tenderness, right fourth digit tender minor swelling noted Psychiatric: Patient has a normal mood and affect. behavior is normal. Judgment and thought content normal.   Results for orders placed or performed during the hospital encounter of 08/25/21  Comprehensive metabolic panel  Result Value Ref Range   Sodium 139 135 - 145 mmol/L   Potassium 3.6 3.5 - 5.1 mmol/L   Chloride 105 98 - 111 mmol/L   CO2 24 22 - 32 mmol/L   Glucose, Bld 96 70 - 99 mg/dL  BUN 13 6 - 20 mg/dL   Creatinine, Ser 0.89 0.44 - 1.00 mg/dL   Calcium 8.9 8.9 - 10.3 mg/dL   Total Protein 7.3 6.5 - 8.1 g/dL   Albumin 4.0 3.5 - 5.0 g/dL   AST 14 (L) 15 - 41 U/L   ALT 13 0 - 44 U/L   Alkaline Phosphatase 75 38 - 126 U/L   Total Bilirubin 0.8 0.3 - 1.2 mg/dL   GFR, Estimated >60 >60 mL/min   Anion gap 10 5 - 15      Assessment & Plan:   1. Right hand pain - ice -ibuprofen for pain - DG Hand Complete Right; Future  2. Finger injury, right, initial encounter -ice -ibuprofen for pain - DG Hand Complete Right; Future  3. Fall, initial encounter -ice  -ibuprofen for pain - DG Hand Complete Right; Future      Follow up plan: Return if symptoms worsen or fail to improve.

## 2021-11-20 ENCOUNTER — Encounter
Admission: RE | Admit: 2021-11-20 | Discharge: 2021-11-20 | Disposition: A | Discharge status: Home / Self Care | Payer: No Typology Code available for payment source | Source: Ambulatory Visit | Attending: Orthopedic Surgery | Admitting: Orthopedic Surgery

## 2021-11-20 ENCOUNTER — Other Ambulatory Visit: Payer: Self-pay

## 2021-11-20 HISTORY — DX: Prediabetes: R73.03

## 2021-11-20 HISTORY — DX: Gastro-esophageal reflux disease without esophagitis: K21.9

## 2021-11-20 NOTE — Patient Instructions (Addendum)
Your procedure is scheduled on: Thursday, February 2 Report to the Registration Desk on the 1st floor of the [facility]. To find out your arrival time, please call [phone] between 1PM - 3PM on: Wednesday, February 1  REMEMBER: Instructions that are not followed completely may result in serious medical risk, up to and including death; or upon the discretion of your surgeon and anesthesiologist your surgery may need to be rescheduled.  Do not eat food after midnight the night before surgery.  No gum chewing, lozengers or hard candies.  You may however, drink CLEAR liquids up to 2 hours before you are scheduled to arrive for your surgery. Do not drink anything within 2 hours of your scheduled arrival time.  Clear liquids include: - water  - apple juice without pulp - gatorade (not RED, PURPLE, OR BLUE) - black coffee or tea (Do NOT add milk or creamers to the coffee or tea) Do NOT drink anything that is not on this list.  TAKE THESE MEDICATIONS THE MORNING OF SURGERY WITH A SIP OF WATER:  Omeprazole (Prilosec) - (take one the night before and one on the morning of surgery - helps to prevent nausea after surgery.)  One week prior to surgery: Stop Anti-inflammatories (NSAIDS) such as Advil, Aleve, Ibuprofen, Motrin, Naproxen, Naprosyn and Aspirin based products such as Excedrin, Goodys Powder, BC Powder. Stop ANY OVER THE COUNTER supplements until after surgery. You may however, continue to take Tylenol if needed for pain up until the day of surgery.  No Alcohol for 24 hours before or after surgery.  No Smoking including e-cigarettes for 24 hours prior to surgery.  No chewable tobacco products for at least 6 hours prior to surgery.  No nicotine patches on the day of surgery.  Do not use any "recreational" drugs for at least a week prior to your surgery.  Please be advised that the combination of cocaine and anesthesia may have negative outcomes, up to and including death. If  you test positive for cocaine, your surgery will be cancelled.  On the morning of surgery brush your teeth with toothpaste and water, you may rinse your mouth with mouthwash if you wish. Do not swallow any toothpaste or mouthwash.  Use CHG Soap as directed on instruction sheet.  Do not wear jewelry, make-up, hairpins, clips or nail polish.  Do not wear lotions, powders, or perfumes.   Do not shave body from the neck down 48 hours prior to surgery just in case you cut yourself which could leave a site for infection.  Also, freshly shaved skin may become irritated if using the CHG soap.  Contact lenses, hearing aids and dentures may not be worn into surgery.  Do not bring valuables to the hospital. [facility] is not responsible for any missing/lost belongings or valuables.   Bring your C-PAP to the hospital with you in case you may have to spend the night.   Notify your doctor if there is any change in your medical condition (cold, fever, infection).  Wear comfortable clothing (specific to your surgery type) to the hospital.  After surgery, you can help prevent lung complications by doing breathing exercises.  Take deep breaths and cough every 1-2 hours. Your doctor may order a device called an Incentive Spirometer to help you take deep breaths.  If you are being discharged the day of surgery, you will not be allowed to drive home. You will need a responsible adult (18 years or older) to drive you home  and stay with you that night.   If you are taking public transportation, you will need to have a responsible adult (18 years or older) with you. Please confirm with your physician that it is acceptable to use public transportation.   Please call the [place] [facility]. at [phone] if you have any questions about these instructions.  Surgery Visitation Policy:  Patients undergoing a surgery or procedure may have one family member or support person with them as long as  that person is not COVID-19 positive or experiencing its symptoms.  That person may remain in the waiting area during the procedure and may rotate out with other people.

## 2021-11-21 ENCOUNTER — Other Ambulatory Visit: Payer: Self-pay | Admitting: Orthopedic Surgery

## 2021-11-21 ENCOUNTER — Other Ambulatory Visit: Payer: Self-pay

## 2021-11-21 ENCOUNTER — Ambulatory Visit
Admission: RE | Admit: 2021-11-21 | Discharge: 2021-11-21 | Disposition: A | Discharge status: Home / Self Care | Payer: No Typology Code available for payment source | Source: Ambulatory Visit | Attending: Family Medicine | Admitting: Family Medicine

## 2021-11-21 DIAGNOSIS — R918 Other nonspecific abnormal finding of lung field: Secondary | ICD-10-CM | POA: Insufficient documentation

## 2021-11-21 DIAGNOSIS — R9389 Abnormal findings on diagnostic imaging of other specified body structures: Secondary | ICD-10-CM | POA: Insufficient documentation

## 2021-11-22 ENCOUNTER — Other Ambulatory Visit: Payer: Self-pay | Admitting: Family Medicine

## 2021-11-22 DIAGNOSIS — R911 Solitary pulmonary nodule: Secondary | ICD-10-CM

## 2021-11-22 NOTE — Progress Notes (Signed)
PCP:  Steele Sizer, MD   Chief Complaint  Patient presents with   Gynecologic Exam    No concerns     HPI:      [name] is a 46 y.o. No obstetric history on file. who LMP was Patient's last menstrual period was 10/27/2021 (approximate)., presents today for her annual examination.  Her menses are now monthly, lasting 5 days, light flow, no BTB, mild dysmen, improved with tylenol. Had been amenorrheic, s/p ablation 2011.   Sex activity: single partner, contraception - tubal ligation. No pain/bleeding.  Last Pap: 10/23/18  Results were: no abnormalities /neg HPV DNA Hx of STDs: HSV, takes valtrex prn--doesn't need Rx RF  Last mammo: 04/26/20 Results: no abnormalities, repeat in 12 months There is a FH of breast cancer in her mat aunt and PGM, genetic testing not indicated. There is no FH of ovarian cancer. The patient does do self-breast exams.  Tobacco use: The patient denies current or previous tobacco use. Alcohol use: none No drug use.  Exercise: mod active  Colonoscopy: 3/22 at [facility], repeat due after 10 yrs  She does get adequate calcium but not Vitamin D in her diet.  Labs with PCP.    Past Medical History:  Diagnosis Date   Arthritis    left knee   Cervical dysplasia    CINII   Constipation    GERD (gastroesophageal reflux disease)    Herpes    Migraine    Obesity    Pap smear abnormality of vagina with ASC-US    Plantar fasciitis    Pre-diabetes    Sleep apnea    Snoring    Vitamin D deficiency     Past Surgical History:  Procedure Laterality Date   COLONOSCOPY WITH PROPOFOL N/A 12/30/2020   Procedure: COLONOSCOPY WITH PROPOFOL;  Surgeon: Jonathon Bellows, MD;  Location: [facility] ENDOSCOPY;  Service: Gastroenterology;  Laterality: N/A;   COLPOSCOPY     ENDOMETRIAL ABLATION  2011   LEEP     TUBAL LIGATION  2000    Family History  Problem Relation Age of Onset   Sleep apnea Mother    Sleep apnea Father    Allergic rhinitis Daughter     Allergic rhinitis Son    Breast cancer Paternal Grandmother 51   Brain cancer Paternal Grandmother    Breast cancer Maternal Aunt 80   Colon cancer Maternal Uncle    Lung cancer Maternal Uncle    Prostate cancer Maternal Uncle    Bone cancer Maternal Aunt     Social History   Socioeconomic History   Marital status: Married    Spouse name: Gerald Stabs    Number of children: 3   Years of education: Not on file   Highest education level: Associate degree: occupational, Hotel manager, or vocational program  Occupational History   Not on file  Tobacco Use   Smoking status: Never   Smokeless tobacco: Never  Vaping Use   Vaping Use: Never used  Substance and Sexual Activity   Alcohol use: No    Alcohol/week: 0.0 standard drinks   Drug use: No   Sexual activity: Yes    Partners: Male    Birth control/protection: Other-see comments, Surgical    Comment: TL  Other Topics Concern   Not on file  Social History Narrative   Not on file   Social Determinants of Health   Financial Resource Strain: Not on file  Food Insecurity: Not on file  Transportation Needs:  Not on file  Physical Activity: Not on file  Stress: Not on file  Social Connections: Not on file  Intimate Partner Violence: Not on file    Current Meds  Medication Sig   cetirizine (ZYRTEC) 10 MG tablet Take 10 mg by mouth daily as needed.   Cholecalciferol (VITAMIN D) 50 MCG (2000 UT) CAPS Take 1 capsule by mouth daily.   diclofenac (VOLTAREN) 75 MG EC tablet Take 1 tablet (75 mg total) by mouth 2 (two) times daily as needed.   EPINEPHrine 0.3 mg/0.3 mL IJ SOAJ injection Inject 0.3 mg into the muscle as needed for anaphylaxis.   fluticasone (FLONASE) 50 MCG/ACT nasal spray Place 2 sprays into both nostrils daily. (Patient taking differently: Place 2 sprays into both nostrils daily as needed.)   meloxicam (MOBIC) 15 MG tablet Take 15 mg by mouth daily as needed.   Multiple Vitamins-Minerals (MULTIVITAL) tablet Take 1 tablet  by mouth daily.   omeprazole (PRILOSEC) 40 MG capsule TAKE 1 CAPSULE (40 MG TOTAL) BY MOUTH DAILY. (Patient taking differently: Take 40 mg by mouth daily as needed.)   Plecanatide (TRULANCE) 3 MG TABS Take 1 tablet by mouth daily. (Patient taking differently: Take 1 tablet by mouth daily as needed.)   Semaglutide, 1 MG/DOSE, 4 MG/3ML SOPN Inject 1 mg as directed once a week. (Patient taking differently: Inject 1 mg as directed once a week. Monday)   [DISCONTINUED] valACYclovir (VALTREX) 500 MG tablet TAKE 1 TABLET BY MOUTH TWICE DAILY FOR 3 DAYS AS NEEDED FOR SYMPTOMS     ROS:  Review of Systems  Constitutional:  Negative for fatigue, fever and unexpected weight change.  Respiratory:  Negative for cough, shortness of breath and wheezing.   Cardiovascular:  Negative for chest pain, palpitations and leg swelling.  Gastrointestinal:  Negative for blood in stool, constipation, diarrhea, nausea and vomiting.  Endocrine: Negative for cold intolerance, heat intolerance and polyuria.  Genitourinary:  Negative for dyspareunia, dysuria, flank pain, frequency, genital sores, hematuria, menstrual problem, pelvic pain, urgency, vaginal bleeding, vaginal discharge and vaginal pain.  Musculoskeletal:  Negative for back pain, joint swelling and myalgias.  Skin:  Negative for rash.  Neurological:  Negative for dizziness, syncope, light-headedness, numbness and headaches.  Hematological:  Negative for adenopathy.  Psychiatric/Behavioral:  Negative for agitation, confusion, sleep disturbance and suicidal ideas. The patient is not nervous/anxious.     Objective: BP 110/80    Ht 5\' 2"  (1.575 m)    Wt 166 lb (75.3 kg)    LMP 10/27/2021 (Approximate) Comment: tubal   BMI 30.36 kg/m   Physical Exam Constitutional:      Appearance: She is well-developed.  Genitourinary:     Vulva normal.     Right Labia: No rash, tenderness or lesions.    Left Labia: No tenderness, lesions or rash.    No vaginal discharge,  erythema or tenderness.      Right Adnexa: not tender and no mass present.    Left Adnexa: not tender and no mass present.    No cervical friability or polyp.     Uterus is not enlarged or tender.  Breasts:    Right: No mass, nipple discharge, skin change or tenderness.     Left: No mass, nipple discharge, skin change or tenderness.  Neck:     Thyroid: No thyromegaly.  Cardiovascular:     Rate and Rhythm: Normal rate and regular rhythm.     Heart sounds: Normal heart sounds. No murmur heard. Pulmonary:  Effort: Pulmonary effort is normal.     Breath sounds: Normal breath sounds.  Abdominal:     Palpations: Abdomen is soft.     Tenderness: There is no abdominal tenderness. There is no guarding or rebound.  Musculoskeletal:        General: Normal range of motion.     Cervical back: Normal range of motion.  Lymphadenopathy:     Cervical: No cervical adenopathy.  Neurological:     General: No focal deficit present.     Mental Status: She is alert and oriented to person, place, and time.     Cranial Nerves: No cranial nerve deficit.  Skin:    General: Skin is warm and dry.  Psychiatric:        Mood and Affect: Mood normal.        Behavior: Behavior normal.        Thought Content: Thought content normal.        Judgment: Judgment normal.  Vitals reviewed.    Assessment/Plan: Encounter for annual routine gynecological examination  Encounter for screening mammogram for malignant neoplasm of breast - Plan: MM 3D SCREEN BREAST BILATERAL; pt to [facility]  Herpes simplex vulvovaginitis - Plan: valACYclovir (VALTREX) 500 MG tablet; Rx RF.   Meds ordered this encounter  Medications   valACYclovir (VALTREX) 500 MG tablet    Sig: TAKE 1 TABLET BY MOUTH TWICE DAILY FOR 3 DAYS AS NEEDED FOR SYMPTOMS    Dispense:  30 tablet    Refill:  1    Order Specific Question:   Supervising Provider    Answer:   Gae Dry [[id number]]           GYN counsel breast self exam,  mammography screening, adequate intake of calcium and vitamin D, diet and exercise     F/U  Return in about 1 year (around 11/23/2022).  Tanmay Halteman B. Jaydence Arnesen, PA-C 11/23/2021 8:40 AM

## 2021-11-23 ENCOUNTER — Ambulatory Visit: Payer: No Typology Code available for payment source | Admitting: Certified Registered Nurse Anesthetist

## 2021-11-23 ENCOUNTER — Encounter: Payer: Self-pay | Admitting: Orthopedic Surgery

## 2021-11-23 ENCOUNTER — Other Ambulatory Visit: Payer: Self-pay

## 2021-11-23 ENCOUNTER — Encounter
Admission: RE | Disposition: A | Discharge status: Home / Self Care | Payer: Self-pay | Source: Home / Self Care | Attending: Orthopedic Surgery

## 2021-11-23 ENCOUNTER — Ambulatory Visit (INDEPENDENT_AMBULATORY_CARE_PROVIDER_SITE_OTHER): Payer: No Typology Code available for payment source | Admitting: Obstetrics and Gynecology

## 2021-11-23 ENCOUNTER — Ambulatory Visit
Admission: RE | Admit: 2021-11-23 | Discharge: 2021-11-23 | Disposition: A | Discharge status: Home / Self Care | Payer: No Typology Code available for payment source | Attending: Orthopedic Surgery | Admitting: Orthopedic Surgery

## 2021-11-23 ENCOUNTER — Encounter: Payer: Self-pay | Admitting: Obstetrics and Gynecology

## 2021-11-23 VITALS — BP 110/80 | Ht 62.0 in | Wt 166.0 lb

## 2021-11-23 DIAGNOSIS — M2342 Loose body in knee, left knee: Secondary | ICD-10-CM | POA: Insufficient documentation

## 2021-11-23 DIAGNOSIS — A6004 Herpesviral vulvovaginitis: Secondary | ICD-10-CM

## 2021-11-23 DIAGNOSIS — M2242 Chondromalacia patellae, left knee: Secondary | ICD-10-CM | POA: Insufficient documentation

## 2021-11-23 DIAGNOSIS — K219 Gastro-esophageal reflux disease without esophagitis: Secondary | ICD-10-CM | POA: Diagnosis not present

## 2021-11-23 DIAGNOSIS — G4730 Sleep apnea, unspecified: Secondary | ICD-10-CM | POA: Insufficient documentation

## 2021-11-23 DIAGNOSIS — R519 Headache, unspecified: Secondary | ICD-10-CM | POA: Insufficient documentation

## 2021-11-23 DIAGNOSIS — Z1231 Encounter for screening mammogram for malignant neoplasm of breast: Secondary | ICD-10-CM

## 2021-11-23 DIAGNOSIS — Z01419 Encounter for gynecological examination (general) (routine) without abnormal findings: Secondary | ICD-10-CM | POA: Diagnosis not present

## 2021-11-23 HISTORY — PX: KNEE ARTHROSCOPY: SHX127

## 2021-11-23 LAB — GLUCOSE, CAPILLARY
Glucose-Capillary: 78 mg/dL (ref 70–99)
Glucose-Capillary: 103 mg/dL — ABNORMAL HIGH (ref 70–99)

## 2021-11-23 LAB — POCT PREGNANCY, URINE: Preg Test, Ur: NEGATIVE

## 2021-11-23 SURGERY — ARTHROSCOPY, KNEE
Anesthesia: General | Site: Knee | Laterality: Left

## 2021-11-23 MED ORDER — PROPOFOL 10 MG/ML IV BOLUS
INTRAVENOUS | Status: AC
  Filled 2021-11-23: qty 20

## 2021-11-23 MED ORDER — FENTANYL CITRATE (PF) 100 MCG/2ML IJ SOLN
INTRAMUSCULAR | Status: DC | PRN
  Administered 2021-11-23: 25 ug via INTRAVENOUS
  Administered 2021-11-23 (×2): 50 ug via INTRAVENOUS
  Administered 2021-11-23: 25 ug via INTRAVENOUS

## 2021-11-23 MED ORDER — BUPIVACAINE-EPINEPHRINE (PF) 0.25% -1:200000 IJ SOLN
INTRAMUSCULAR | Status: AC
  Filled 2021-11-23: qty 30

## 2021-11-23 MED ORDER — PROPOFOL 10 MG/ML IV BOLUS
INTRAVENOUS | Status: DC | PRN
Start: 2021-11-23 — End: 2021-11-23
  Administered 2021-11-23: 160 mg via INTRAVENOUS

## 2021-11-23 MED ORDER — CHLORHEXIDINE GLUCONATE CLOTH 2 % EX PADS: 6.0000 | MEDICATED_PAD | Freq: Once | CUTANEOUS | Status: DC

## 2021-11-23 MED ORDER — ACETAMINOPHEN 500 MG PO TABS
ORAL_TABLET | ORAL | Status: AC
  Administered 2021-11-23: 1000 mg via ORAL
  Filled 2021-11-23: qty 2

## 2021-11-23 MED ORDER — MIDAZOLAM HCL 2 MG/2ML IJ SOLN
INTRAMUSCULAR | Status: AC
  Filled 2021-11-23: qty 2

## 2021-11-23 MED ORDER — FENTANYL CITRATE (PF) 100 MCG/2ML IJ SOLN
INTRAMUSCULAR | Status: AC
  Administered 2021-11-23: 25 ug via INTRAVENOUS
  Filled 2021-11-23: qty 2

## 2021-11-23 MED ORDER — CEFAZOLIN SODIUM-DEXTROSE 2-4 GM/100ML-% IV SOLN
2.0000 g | INTRAVENOUS | Status: AC
  Administered 2021-11-23: 2 g via INTRAVENOUS

## 2021-11-23 MED ORDER — VALACYCLOVIR HCL 500 MG PO TABS
ORAL_TABLET | ORAL | 1 refills | Status: DC
  Filled 2021-11-23: qty 30, 15d supply, fill #0
  Filled 2022-05-28: qty 30, 15d supply, fill #1

## 2021-11-23 MED ORDER — ONDANSETRON HCL 4 MG/2ML IJ SOLN
INTRAMUSCULAR | Status: DC | PRN
  Administered 2021-11-23: 4 mg via INTRAVENOUS

## 2021-11-23 MED ORDER — ONDANSETRON HCL 4 MG/2ML IJ SOLN
INTRAMUSCULAR | Status: AC
  Filled 2021-11-23: qty 2

## 2021-11-23 MED ORDER — ASPIRIN EC 325 MG PO TBEC
325.0000 mg | DELAYED_RELEASE_TABLET | Freq: Every day | ORAL | 0 refills | Status: DC
  Filled 2021-11-23: qty 45, 45d supply, fill #0

## 2021-11-23 MED ORDER — LACTATED RINGERS IV SOLN: INTRAVENOUS | Status: DC

## 2021-11-23 MED ORDER — FENTANYL CITRATE (PF) 100 MCG/2ML IJ SOLN
INTRAMUSCULAR | Status: AC
  Filled 2021-11-23: qty 2

## 2021-11-23 MED ORDER — DEXAMETHASONE SODIUM PHOSPHATE 10 MG/ML IJ SOLN
INTRAMUSCULAR | Status: AC
  Filled 2021-11-23: qty 1

## 2021-11-23 MED ORDER — ONDANSETRON HCL 4 MG/2ML IJ SOLN: 4.0000 mg | Freq: Once | INTRAMUSCULAR | Status: DC | PRN

## 2021-11-23 MED ORDER — BUPIVACAINE-EPINEPHRINE 0.25% -1:200000 IJ SOLN
INTRAMUSCULAR | Status: DC | PRN
  Administered 2021-11-23: 30 mL

## 2021-11-23 MED ORDER — OXYCODONE HCL 5 MG PO TABS
5.0000 mg | ORAL_TABLET | Freq: Once | ORAL | Status: AC
  Administered 2021-11-23: 5 mg via ORAL

## 2021-11-23 MED ORDER — ACETAMINOPHEN 500 MG PO TABS: 1000.0000 mg | ORAL_TABLET | ORAL | Status: AC

## 2021-11-23 MED ORDER — LIDOCAINE HCL (PF) 2 % IJ SOLN
INTRAMUSCULAR | Status: AC
  Filled 2021-11-23: qty 5

## 2021-11-23 MED ORDER — ONDANSETRON HCL 4 MG PO TABS
4.0000 mg | ORAL_TABLET | Freq: Three times a day (TID) | ORAL | 0 refills | Status: DC | PRN
  Filled 2021-11-23: qty 30, 10d supply, fill #0

## 2021-11-23 MED ORDER — MIDAZOLAM HCL 2 MG/2ML IJ SOLN
INTRAMUSCULAR | Status: DC | PRN
  Administered 2021-11-23: 2 mg via INTRAVENOUS

## 2021-11-23 MED ORDER — CEFAZOLIN SODIUM-DEXTROSE 2-4 GM/100ML-% IV SOLN
INTRAVENOUS | Status: AC
  Filled 2021-11-23: qty 100

## 2021-11-23 MED ORDER — ORAL CARE MOUTH RINSE: 15.0000 mL | Freq: Once | OROMUCOSAL | Status: AC

## 2021-11-23 MED ORDER — ACETAMINOPHEN 10 MG/ML IV SOLN
INTRAVENOUS | Status: AC
  Filled 2021-11-23: qty 100

## 2021-11-23 MED ORDER — CHLORHEXIDINE GLUCONATE 0.12 % MT SOLN
OROMUCOSAL | Status: AC
  Administered 2021-11-23: 15 mL via OROMUCOSAL
  Filled 2021-11-23: qty 15

## 2021-11-23 MED ORDER — LIDOCAINE HCL (CARDIAC) PF 100 MG/5ML IV SOSY
PREFILLED_SYRINGE | INTRAVENOUS | Status: DC | PRN
  Administered 2021-11-23: 100 mg via INTRAVENOUS

## 2021-11-23 MED ORDER — DEXMEDETOMIDINE (PRECEDEX) IN NS 20 MCG/5ML (4 MCG/ML) IV SYRINGE
PREFILLED_SYRINGE | INTRAVENOUS | Status: DC | PRN
  Administered 2021-11-23: 8 ug via INTRAVENOUS

## 2021-11-23 MED ORDER — DEXAMETHASONE SODIUM PHOSPHATE 10 MG/ML IJ SOLN
INTRAMUSCULAR | Status: DC | PRN
  Administered 2021-11-23: 10 mg via INTRAVENOUS

## 2021-11-23 MED ORDER — OXYCODONE HCL 5 MG PO TABS
ORAL_TABLET | ORAL | Status: AC
  Filled 2021-11-23: qty 1

## 2021-11-23 MED ORDER — FENTANYL CITRATE (PF) 100 MCG/2ML IJ SOLN
25.0000 ug | INTRAMUSCULAR | Status: DC | PRN
  Administered 2021-11-23 (×3): 25 ug via INTRAVENOUS

## 2021-11-23 MED ORDER — LIDOCAINE HCL 1 % IJ SOLN
INTRAMUSCULAR | Status: DC | PRN
  Administered 2021-11-23: 5 mL

## 2021-11-23 MED ORDER — HYDROCODONE-ACETAMINOPHEN 5-325 MG PO TABS
1.0000 | ORAL_TABLET | ORAL | 0 refills | Status: DC | PRN
  Filled 2021-11-23: qty 40, 4d supply, fill #0

## 2021-11-23 MED ORDER — LACTATED RINGERS IR SOLN
Status: DC | PRN
  Administered 2021-11-23 (×4): 3000 mL

## 2021-11-23 MED ORDER — CHLORHEXIDINE GLUCONATE 0.12 % MT SOLN: 15.0000 mL | Freq: Once | OROMUCOSAL | Status: AC

## 2021-11-23 MED ORDER — LIDOCAINE HCL (PF) 1 % IJ SOLN
INTRAMUSCULAR | Status: AC
  Filled 2021-11-23: qty 30

## 2021-11-23 SURGICAL SUPPLY — 51 items
ADAPTER IRRIG TUBE 2 SPIKE SOL (ADAPTER) ×4 IMPLANT
ADPR TBG 2 SPK PMP STRL ASCP (ADAPTER) ×2
BLADE FULL RADIUS 3.5 (BLADE) IMPLANT
BLADE SHAVER 4.5X7 STR FR (MISCELLANEOUS) ×1 IMPLANT
BUR BR 5.5 WIDE MOUTH (BURR) IMPLANT
CNTNR SPEC 2.5X3XGRAD LEK (MISCELLANEOUS)
CONT SPEC 4OZ STER OR WHT (MISCELLANEOUS)
CONT SPEC 4OZ STRL OR WHT (MISCELLANEOUS)
CONTAINER SPEC 2.5X3XGRAD LEK (MISCELLANEOUS) IMPLANT
COOLER POLAR GLACIER W/PUMP (MISCELLANEOUS) ×1 IMPLANT
CUFF TOURN SGL QUICK 24 (TOURNIQUET CUFF)
CUFF TOURN SGL QUICK 34 (TOURNIQUET CUFF)
CUFF TRNQT CYL 24X4X16.5-23 (TOURNIQUET CUFF) IMPLANT
CUFF TRNQT CYL 34X4.125X (TOURNIQUET CUFF) IMPLANT
DEVICE SUCT BLK HOLE OR FLOOR (MISCELLANEOUS) IMPLANT
DRAPE ARTHRO LIMB 89X125 STRL (DRAPES) ×2 IMPLANT
DRAPE IMP U-DRAPE 54X76 (DRAPES) ×2 IMPLANT
DURAPREP 26ML APPLICATOR (WOUND CARE) ×5 IMPLANT
GAUZE SPONGE 4X4 12PLY STRL (GAUZE/BANDAGES/DRESSINGS) ×2 IMPLANT
GAUZE XEROFORM 1X8 LF (GAUZE/BANDAGES/DRESSINGS) ×2 IMPLANT
GLOVE SURG ORTHO LTX SZ9 (GLOVE) ×4 IMPLANT
GLOVE SURG UNDER POLY LF SZ9 (GLOVE) ×2 IMPLANT
GOWN STRL REUS TWL 2XL XL LVL4 (GOWN DISPOSABLE) ×2 IMPLANT
GOWN STRL REUS W/ TWL LRG LVL3 (GOWN DISPOSABLE) ×1 IMPLANT
GOWN STRL REUS W/TWL LRG LVL3 (GOWN DISPOSABLE) ×2
IV LACTATED RINGER IRRG 3000ML (IV SOLUTION) ×8
IV LR IRRIG 3000ML ARTHROMATIC (IV SOLUTION) ×4 IMPLANT
KIT TURNOVER KIT A (KITS) ×2 IMPLANT
MANIFOLD NEPTUNE II (INSTRUMENTS) ×3 IMPLANT
MAT ABSORB  FLUID 56X50 GRAY (MISCELLANEOUS) ×2
MAT ABSORB FLUID 56X50 GRAY (MISCELLANEOUS) ×2 IMPLANT
NEEDLE HYPO 22GX1.5 SAFETY (NEEDLE) ×2 IMPLANT
PACK ARTHROSCOPY KNEE (MISCELLANEOUS) ×2 IMPLANT
PAD ABD DERMACEA PRESS 5X9 (GAUZE/BANDAGES/DRESSINGS) ×4 IMPLANT
PAD WRAPON POLAR KNEE (MISCELLANEOUS) ×1 IMPLANT
SHAVER BLADE BONE CUTTER  5.5 (BLADE)
SHAVER BLADE BONE CUTTER 4.5 (BLADE) ×2 IMPLANT
SHAVER BLADE BONE CUTTER 5.5 (BLADE) IMPLANT
SHAVER BLADE TAPERED BLUNT 4 (BLADE) ×2 IMPLANT
SOL PREP PVP 2OZ (MISCELLANEOUS)
SOLUTION PREP PVP 2OZ (MISCELLANEOUS) IMPLANT
SPONGE T-LAP 18X18 ~~LOC~~+RFID (SPONGE) ×2 IMPLANT
STRIP CLOSURE SKIN 1/2X4 (GAUZE/BANDAGES/DRESSINGS) ×2 IMPLANT
SUT ETHILON 4-0 (SUTURE) ×2
SUT ETHILON 4-0 FS2 18XMFL BLK (SUTURE) ×1
SUTURE ETHLN 4-0 FS2 18XMF BLK (SUTURE) ×1 IMPLANT
TUBING INFLOW SET DBFLO PUMP (TUBING) ×2 IMPLANT
TUBING OUTFLOW SET DBLFO PUMP (TUBING) ×2 IMPLANT
WAND WEREWOLF FLOW 90D (MISCELLANEOUS) ×2 IMPLANT
WATER STERILE IRR 500ML POUR (IV SOLUTION) ×2 IMPLANT
WRAPON POLAR PAD KNEE (MISCELLANEOUS) ×2

## 2021-11-23 NOTE — Anesthesia Postprocedure Evaluation (Signed)
Anesthesia Post Note  Patient: Lori Beltran  Procedure(s) Performed: ARTHROSCOPY KNEE, CHONDROPLASTY OF PATELLORFEMORAL JOINT, REMOVAL OF LOOSE BODIES (Left: Knee)  Patient location during evaluation: PACU Anesthesia Type: General Level of consciousness: awake and alert Pain management: pain level controlled Vital Signs Assessment: post-procedure vital signs reviewed and stable Respiratory status: spontaneous breathing, nonlabored ventilation, respiratory function stable and patient connected to nasal cannula oxygen Cardiovascular status: blood pressure returned to baseline and stable Postop Assessment: no apparent nausea or vomiting Anesthetic complications: no   No notable events documented.   Last Vitals:  Vitals:   11/23/21 1305 11/23/21 1310  BP:  101/75  Pulse: 80 80  Resp: 16 (!) 22  Temp:    SpO2: 100% 100%    Last Pain:  Vitals:   11/23/21 1310  TempSrc:   PainSc: 0-No pain                 Molli Barrows

## 2021-11-23 NOTE — Anesthesia Procedure Notes (Signed)
Procedure Name: LMA Insertion Date/Time: 11/23/2021 11:38 AM Performed by: Johnna Acosta, CRNA Pre-anesthesia Checklist: Patient identified, Emergency Drugs available, Suction available, Patient being monitored and Timeout performed Patient Re-evaluated:Patient Re-evaluated prior to induction Oxygen Delivery Method: Circle system utilized Preoxygenation: Pre-oxygenation with 100% oxygen Induction Type: IV induction Ventilation: Mask ventilation without difficulty LMA: LMA inserted LMA Size: 4.0 Tube type: Oral Number of attempts: 1 Placement Confirmation: positive ETCO2 and breath sounds checked- equal and bilateral Tube secured with: Tape Dental Injury: Teeth and Oropharynx as per pre-operative assessment

## 2021-11-23 NOTE — Op Note (Signed)
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 female with a chondral loose body confirmed by MRI with persistent left knee pain.  Patient failed conservative management and elected for arthroscopic evaluation of the left knee with loose body removal.  The risks benefits and alternatives were discussed with the patient preoperatively including the risks of infection, bleeding, nerve injury, knee stiffness, persistent pain, osteoarthritis and the need for further surgery. Medical  risks include DVT and pulmonary embolism, myocardial infarction, stroke, pneumonia, respiratory failure and death. The patient understood these risks and wished to proceed.  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  Patient had a grade 3/4 chondral lesion of approximately 1 cm in the medial trochlea extending into the central sulcus.  Patient had a grade 3/4 chondral lesion of the medial femoral condyle in the posterior weightbearing surface measuring approximately 5 cm.  Patient had a thickened and erythematous medial plica and lateral tilting of the patella.  Patient had a 1 cm loose body in the medial gutter the donor site of which is likely the medial trochlea.  OPERATIVE PROCEDURE: Patient was met in the preoperative area. The operative extremity was signed with the word yes and my initials according the hospital's correct site of surgery  protocol.  The patient was brought to the operating room where she was placed supine on the operative table. General anesthesia was administered. The patient was prepped and draped in a sterile fashion.  A timeout was performed to verify the patient's name, date of birth, medical record number, correct site of surgery correct procedure to be performed. It was also used to verify the patient received antibiotics that all appropriate instruments, and radiographic studies were available in the room. Once all in attendance were in agreement, the case began.  Proposed arthroscopy incisions were drawn out with a surgical marker. These were pre-injected with 1% lidocaine plain. An 11 blade was used to establish an inferior lateral and inferomedial portals. The inferomedial portal was created using a 18-gauge spinal needle under direct visualization.  A full diagnostic examination of the knee was performed including the suprapatellar pouch, patellofemoral joint, medial lateral compartments as well as the medial lateral gutters, the intercondylar notch in the posterior knee.  Findings on arthroscopy are listed above.  A 1 cm loose body was identified in the medial gutter.  A pituitary grasper was used to remove this through the medial portal.  A 90 degree [employer] werewolf wand was used to debride the thickened and inflamed medial plica.  A chondroplasty of the medial femoral condyle was performed using a Dyonics flare shaver blade until stable chondral edges were achieved.  Patient had an extensive chondroplasty of the undersurface of patella was also performed using the Dyonics player shaver blade.  The shaver was also used to perform a chondroplasty of the medial trochlea.   A partial synovectomy of the anterior knee, suprapatellar pouch as well as the medial and lateral gutters was also performed using a 4-0 [place]  Nephew werewolf wand.  The knee was then copiously lavaged. All arthroscopic  instruments were removed. The two arthroscopy portals were closed with 4-0 nylon. Steri-Strips were applied along with a dry sterile and compressive dressing. The patient was brought to the PACU in stable condition. I was scrubbed and present for the entire case and all sharp and instrument counts were correct at the conclusion the case. I spoke with the patient's husband postoperatively to let him know the case was performed without complication and the patient was stable in the recovery room.    Timoteo Gaul, MD

## 2021-11-23 NOTE — H&P (Signed)
PREOPERATIVE H&P  Chief Complaint: Left Knee Loose Body  HPI: Lori Beltran is a 46 y.o. adult who presents for preoperative history and physical with a diagnosis of Left Knee Loose Body confirmed by MRI. Symptoms of pain and mechanical symptoms are significantly impairing activities of daily living.  She has failed nonoperative management wished to proceed with arthroscopic loose body removal.  Past Medical History:  Diagnosis Date   Arthritis    left knee   Cervical dysplasia    CINII   Constipation    GERD (gastroesophageal reflux disease)    Herpes    Migraine    Obesity    Pap smear abnormality of vagina with ASC-US    Plantar fasciitis    Pre-diabetes    Sleep apnea    Snoring    Vitamin D deficiency    Past Surgical History:  Procedure Laterality Date   COLONOSCOPY WITH PROPOFOL N/A 12/30/2020   Procedure: COLONOSCOPY WITH PROPOFOL;  Surgeon: Jonathon Bellows, MD;  Location: [facility] ENDOSCOPY;  Service: Gastroenterology;  Laterality: N/A;   COLPOSCOPY     ENDOMETRIAL ABLATION  2011   LEEP     TUBAL LIGATION  2000   Social History   Socioeconomic History   Marital status: Married    Spouse name: Gerald Stabs    Number of children: 3   Years of education: Not on file   Highest education level: Associate degree: occupational, Hotel manager, or vocational program  Occupational History   Not on file  Tobacco Use   Smoking status: Never   Smokeless tobacco: Never  Vaping Use   Vaping Use: Never used  Substance and Sexual Activity   Alcohol use: No    Alcohol/week: 0.0 standard drinks   Drug use: No   Sexual activity: Yes    Partners: Male    Birth control/protection: Other-see comments, Surgical    Comment: TL  Other Topics Concern   Not on file  Social History Narrative   Not on file   Social Determinants of Health   Financial Resource Strain: Not on file  Food Insecurity: Not on file  Transportation Needs: Not on file  Physical Activity: Not on file  Stress:  Not on file  Social Connections: Not on file   Family History  Problem Relation Age of Onset   Sleep apnea Mother    Sleep apnea Father    Allergic rhinitis Daughter    Allergic rhinitis Son    Breast cancer Paternal Grandmother 44   Brain cancer Paternal Grandmother    Breast cancer Maternal Aunt 80   Colon cancer Maternal Uncle    Lung cancer Maternal Uncle    Prostate cancer Maternal Uncle    Bone cancer Maternal Aunt    Allergies  Allergen Reactions   Peanut Butter Flavor Itching and Swelling   Shellfish Allergy Itching and Swelling   Sodium Hyaluronate (Avian)     Patient reported avian allergy   Prior to Admission medications   Medication Sig Start Date End Date Taking? Authorizing Provider  cetirizine (ZYRTEC) 10 MG tablet Take 10 mg by mouth daily as needed.   Yes [provider]  Cholecalciferol (VITAMIN D) 50 MCG (2000 UT) CAPS Take 1 capsule by mouth daily.   Yes [provider]  diclofenac (VOLTAREN) 75 MG EC tablet Take 1 tablet (75 mg total) by mouth 2 (two) times daily as needed. 10/19/21  Yes Sowles, Drue Stager, MD  fluticasone (FLONASE) 50 MCG/ACT nasal spray Place 2 sprays into both  nostrils daily. Patient taking differently: Place 2 sprays into both nostrils daily as needed. 08/23/21  Yes Sowles, Drue Stager, MD  meloxicam (MOBIC) 15 MG tablet Take 15 mg by mouth daily as needed.   Yes [provider]  Multiple Vitamins-Minerals (MULTIVITAL) tablet Take 1 tablet by mouth daily.   Yes [provider]  omeprazole (PRILOSEC) 40 MG capsule TAKE 1 CAPSULE (40 MG TOTAL) BY MOUTH DAILY. Patient taking differently: Take 40 mg by mouth daily as needed. 04/25/20  Yes Sowles, Drue Stager, MD  Plecanatide (TRULANCE) 3 MG TABS Take 1 tablet by mouth daily. Patient taking differently: Take 1 tablet by mouth daily as needed. 01/25/21  Yes Sowles, Drue Stager, MD  valACYclovir (VALTREX) 500 MG tablet TAKE 1 TABLET BY MOUTH TWICE DAILY FOR 3 DAYS AS NEEDED FOR  SYMPTOMS 12/25/60  Yes Copland, Alicia B, PA-C  EPINEPHrine 0.3 mg/0.3 mL IJ SOAJ injection Inject 0.3 mg into the muscle as needed for anaphylaxis. 07/07/20   Steele Sizer, MD  Semaglutide, 1 MG/DOSE, 4 MG/3ML SOPN Inject 1 mg as directed once a week. Patient taking differently: Inject 1 mg as directed once a week. Monday 09/13/21   Steele Sizer, MD     Positive ROS: All other systems have been reviewed and were otherwise negative with the exception of those mentioned in the HPI and as above.  Physical Exam: General: Alert, no acute distress Cardiovascular: Regular rate and rhythm, no murmurs rubs or gallops.  No pedal edema Respiratory: Clear to auscultation bilaterally, no wheezes rales or rhonchi. No cyanosis, no use of accessory musculature GI: No organomegaly, abdomen is soft and non-tender nondistended with positive bowel sounds. Skin: Skin intact, no lesions within the operative field. Neurologic: Sensation intact distally Psychiatric: Patient is competent for consent with normal mood and affect Lymphatic: No cervical lymphadenopathy  MUSCULOSKELETAL: Left Knee: The patient's skin is intact. The patient can achieve within 5 degrees of full extension and can flex to approximately 85-90 degrees today. She has a small effusion, but no ligamentous laxity. She has mild pain with a valgus applied force to the left knee. She has no calf tenderness or lower leg edema. She has palpable pedal pulses, intact sensation to light touch and intact motor function distally. The patient has patellofemoral crepitus without grind or apprehension.   Radiology: I reviewed the patient's plain x-ray films 02/08/2021. There is no fracture, dislocation, or significant degenerative changes on these films. The patient had mild bilateral tricompartmental osteoarthritis and medial compartment narrowing. Her MRI demonstrated free edge fraying of the posterior horn of the medial meniscus without a significant tear.  Her lateral meniscus was intact. She has tricompartmental osteoarthritis, most evident in the patellofemoral compartment with notable chondral defects of the medial trochlea and medial patellar facet. She has a moderate-sized joint effusion with possible chondral loose bodies in the suprapatellar pouch measuring approximately 1 cm as well as a small Baker's cyst. She had a small thin localized fluid along the anterior surface of the distal quadriceps tendon, which may represent a small hematoma according to the radiology report.   Assessment: Left Knee Loose Body  Plan: Plan for Procedure(s): Left knee arthroscopic loose body removal and chondroplasty  I met the patient in the preoperative area today.  I marked the left knee according hospital's correct site of surgery protocol.  Preoperative history and physical was performed at the bedside.  I reviewed the details of the operation as well as the postoperative course with the patient.  I answered all  her questions.  I discussed the risks and benefits of surgery. The risks include but are not limited to infection, bleeding , nerve or blood vessel injury, joint stiffness or loss of motion, persistent pain, weakness or instability, and hardware failure and the need for further surgery.  Patient understood these risks and wished to proceed.     Thornton Park, MD   11/23/2021 11:27 AM

## 2021-11-23 NOTE — Patient Instructions (Addendum)
I value your feedback and you entrusting us with your care. If you get a [facility] patient survey, I would appreciate you taking the time to let us know about your experience today. Thank you!  [facility]: [phone]  [facility]: [phone]    

## 2021-11-23 NOTE — Transfer of Care (Signed)
Immediate Anesthesia Transfer of Care Note  Patient: Lori Beltran  Procedure(s) Performed: ARTHROSCOPY KNEE, CHONDROPLASTY OF PATELLORFEMORAL JOINT, REMOVAL OF LOOSE BODIES (Left: Knee)  Patient Location: PACU  Anesthesia Type:General  Level of Consciousness: drowsy  Airway & Oxygen Therapy: Patient Spontanous Breathing and Patient connected to nasal cannula oxygen  Post-op Assessment: Report given to RN and Post -op Vital signs reviewed and stable  Post vital signs: Reviewed and stable  Last Vitals:  Vitals Value Taken Time  BP 97/72 11/23/21 1250  Temp 36.7 C 11/23/21 1244  Pulse 83 11/23/21 1251  Resp 16 11/23/21 1251  SpO2 100 % 11/23/21 1251  Vitals shown include unvalidated device data.  Last Pain:  Vitals:   11/23/21 1244  TempSrc:   PainSc: Asleep         Complications: No notable events documented.

## 2021-11-23 NOTE — Anesthesia Preprocedure Evaluation (Signed)
Anesthesia Evaluation  Patient identified by MRN, date of birth, ID band Patient awake    Reviewed: Allergy & Precautions, H&P , NPO status , Patient's Chart, lab work & pertinent test results, reviewed documented beta blocker date and time   Airway Mallampati: II  TM Distance: >3 FB Neck ROM: full    Dental  (+) Teeth Intact   Pulmonary neg pulmonary ROS, sleep apnea and Continuous Positive Airway Pressure Ventilation ,    Pulmonary exam normal        Cardiovascular Exercise Tolerance: Good negative cardio ROS Normal cardiovascular exam Rate:Normal     Neuro/Psych  Headaches, negative neurological ROS  negative psych ROS   GI/Hepatic Neg liver ROS, GERD  Medicated,  Endo/Other  negative endocrine ROS  Renal/GU negative Renal ROS  negative genitourinary   Musculoskeletal   Abdominal   Peds  Hematology negative hematology ROS (+)   Anesthesia Other Findings   Reproductive/Obstetrics negative OB ROS                             Anesthesia Physical Anesthesia Plan  ASA: 2  Anesthesia Plan: General LMA   Post-op Pain Management:    Induction:   PONV Risk Score and Plan: 4 or greater  Airway Management Planned:   Additional Equipment:   Intra-op Plan:   Post-operative Plan:   Informed Consent: I have reviewed the patients History and Physical, chart, labs and discussed the procedure including the risks, benefits and alternatives for the proposed anesthesia with the patient or authorized representative who has indicated his/her understanding and acceptance.       Plan Discussed with: CRNA  Anesthesia Plan Comments:         Anesthesia Quick Evaluation

## 2021-11-23 NOTE — Discharge Instructions (Signed)
AMBULATORY SURGERY  DISCHARGE INSTRUCTIONS   The drugs that you were given will stay in your system until tomorrow so for the next 24 hours you should not:  Drive an automobile Make any legal decisions Drink any alcoholic beverage   You may resume regular meals tomorrow.  Today it is better to start with liquids and gradually work up to solid foods.  You may eat anything you prefer, but it is better to start with liquids, then soup and crackers, and gradually work up to solid foods.   Please notify your doctor immediately if you have any unusual bleeding, trouble breathing, redness and pain at the surgery site, drainage, fever, or pain not relieved by medication.    Additional Instructions:  Take Ibuprofen 800mg  when you get home then 4 hours later take Tylenol 500mg  and continue regularly around the clock. Take Norco if needed.    Please contact your physician with any problems or Same Day Surgery at [phone], Monday through Friday 6 am to 4 pm, or [place] at [facility] number at [phone].

## 2021-11-24 ENCOUNTER — Encounter: Payer: Self-pay | Admitting: Orthopedic Surgery

## 2021-11-27 ENCOUNTER — Other Ambulatory Visit: Payer: Self-pay

## 2021-11-27 ENCOUNTER — Ambulatory Visit
Admission: RE | Admit: 2021-11-27 | Discharge: 2021-11-27 | Disposition: A | Discharge status: Home / Self Care | Payer: No Typology Code available for payment source | Source: Ambulatory Visit | Attending: Obstetrics and Gynecology | Admitting: Obstetrics and Gynecology

## 2021-11-27 DIAGNOSIS — Z1231 Encounter for screening mammogram for malignant neoplasm of breast: Secondary | ICD-10-CM | POA: Insufficient documentation

## 2021-11-29 ENCOUNTER — Ambulatory Visit: Payer: No Typology Code available for payment source

## 2021-12-22 ENCOUNTER — Other Ambulatory Visit: Payer: Self-pay | Admitting: Family Medicine

## 2021-12-22 ENCOUNTER — Other Ambulatory Visit: Payer: Self-pay

## 2021-12-25 ENCOUNTER — Other Ambulatory Visit: Payer: Self-pay

## 2021-12-25 MED FILL — Semaglutide Soln Pen-inj 1 MG/DOSE (4 MG/3ML): SUBCUTANEOUS | 84 days supply | Qty: 9 | Fill #0 | Status: AC

## 2021-12-29 ENCOUNTER — Other Ambulatory Visit: Payer: Self-pay

## 2021-12-29 MED ORDER — MELOXICAM 7.5 MG PO TABS
ORAL_TABLET | ORAL | 1 refills | Status: DC
  Filled 2021-12-29: qty 60, 60d supply, fill #0

## 2022-01-08 ENCOUNTER — Other Ambulatory Visit: Payer: Self-pay

## 2022-02-08 NOTE — Progress Notes (Signed)
Name: Lori Beltran   MRN: [id number]    DOB: [birth year]   Date:02/09/2022 ? ?     Progress Note ? ?Subjective ? ?Chief Complaint ? ?Annual Exam ? ?HPI ? ?Patient presents for annual CPE. ? ?Diet: she is eating more salads, vegetables and healthier snacks ?Exercise: she is still doing PT at home for her knee and also walking 30 minutes during lunch break   ? ?[place] Office Visit from 10/12/2021 in [facility]  ?AUDIT-C Score 0  ? ?  ? ?Depression: Phq 9 is  negative ? ?  02/09/2022  ?  7:46 AM 10/27/2021  ? 10:53 AM 10/12/2021  ? 12:28 PM 09/12/2021  ?  2:29 PM 08/23/2021  ?  9:25 AM  ?Depression screen PHQ 2/9  ?Decreased Interest 0 0 0 0 0  ?Down, Depressed, Hopeless 0 0 0 0 0  ?PHQ - 2 Score 0 0 0 0 0  ?Altered sleeping 0 0  0 0  ?Tired, decreased energy 0 0  0 0  ?Change in appetite 0 0  0 0  ?Feeling bad or failure about yourself  0 0  0 0  ?Trouble concentrating 0 0  0 0  ?Moving slowly or fidgety/restless 0 0  0 0  ?Suicidal thoughts 0 0  0 0  ?PHQ-9 Score 0 0  0 0  ?Difficult doing work/chores  Not difficult at all     ? ?Hypertension: ?BP Readings from Last 3 Encounters:  ?02/09/22 122/68  ?11/23/21 105/68  ?11/23/21 110/80  ? ?Obesity: ?Wt Readings from Last 3 Encounters:  ?02/09/22 157 lb (71.2 kg)  ?11/23/21 166 lb 0.1 oz (75.3 kg)  ?11/23/21 166 lb (75.3 kg)  ? ?BMI Readings from Last 3 Encounters:  ?02/09/22 28.72 kg/m?  ?11/23/21 30.36 kg/m?  ?11/23/21 30.36 kg/m?  ?  ? ?Vaccines:  ? ?HPV: N/A ?Tdap: up to date ?Shingrix: N/A ?Pneumonia: N/A ?Flu: up to date ([place]) ?COVID-19: discussed bivalent booster  ? ? ?Hep C Screening: 10/17/17 ?STD testing and prevention (HIV/chl/gon/syphilis): 10/23/18 ?Intimate partner violence: negative screen  ?Sexual History :one partner, no pain or discomfort  ?Menstrual History/LMP/Abnormal Bleeding: history of endometrial ablation, cycles are intermittent and light, LMP around Feb - under the care of gyn  ?Discussed importance of follow up if any  post-menopausal bleeding: no  ?Incontinence Symptoms: negative for symptoms  ? ?Breast cancer:  ?- Last Mammogram: up to date  ?- BRCA gene screening: paternal grandmother had breast cancer  ? ?Osteoporosis Prevention : Discussed high calcium and vitamin D supplementation, weight bearing exercises ?Bone density :not applicable  ? ?Cervical cancer screening: 10/23/18 ? ?Skin cancer: Discussed monitoring for atypical lesions  ?Colorectal cancer: 12/30/20   ?Lung cancer:  Low Dose CT Chest recommended if Age 34-80 years, 20 pack-year currently smoking OR have quit w/in 15years. Patient does not qualify for screen   ?ECG: N/A ? ?Advanced Care Planning: A voluntary discussion about advance care planning including the explanation and discussion of advance directives.  Discussed health care proxy and Living will, and the patient was able to identify a health care proxy as husband.  Patient does not have a living will and power of attorney of health care  ? ?Lipids: ?Lab Results  ?Component Value Date  ? CHOL 169 07/08/2020  ? CHOL 157 10/23/2018  ? CHOL 157 10/23/2018  ? ?Lab Results  ?Component Value Date  ? HDL 45 (L) 07/08/2020  ? HDL 44 10/23/2018  ? HDL 44  10/23/2018  ? ?Lab Results  ?Component Value Date  ? LDLCALC 103 (H) 07/08/2020  ? [place] 88 10/23/2018  ? [place] 88 10/23/2018  ? ?Lab Results  ?Component Value Date  ? TRIG 109 07/08/2020  ? TRIG 124 10/23/2018  ? TRIG 124 10/23/2018  ? ?Lab Results  ?Component Value Date  ? CHOLHDL 3.8 07/08/2020  ? CHOLHDL 3.2 10/17/2017  ? CHOLHDL 3.3 10/11/2016  ? ?No results found for: LDLDIRECT ? ?Glucose: ?Glucose  ?Date Value Ref Range Status  ?10/23/2018 92 65 - 99 mg/dL Final  ?09/29/2014 84 65 - 99 mg/dL Final  ?09/23/2013 91 65 - 99 mg/dL Final  ?09/22/2012 89 65 - 99 mg/dL Final  ? ?Glucose, Bld  ?Date Value Ref Range Status  ?08/25/2021 96 70 - 99 mg/dL Final  ?  Comment:  ?  Glucose reference range applies only to samples taken after fasting for at least 8 hours.   ?07/08/2020 98 65 - 99 mg/dL Final  ?  Comment:  ?  . ?           Fasting reference interval ?. ?  ?10/17/2017 102 (H) 65 - 99 mg/dL Final  ? ?Glucose-Capillary  ?Date Value Ref Range Status  ?11/23/2021 103 (H) 70 - 99 mg/dL Final  ?  Comment:  ?  Glucose reference range applies only to samples taken after fasting for at least 8 hours.  ?11/23/2021 78 70 - 99 mg/dL Final  ?  Comment:  ?  Glucose reference range applies only to samples taken after fasting for at least 8 hours.  ? ? ?Patient Active Problem List  ? Diagnosis Date Noted  ? OSA on CPAP 07/20/2021  ? Primary osteoarthritis of both knees 01/31/2021  ? Angioedema 07/04/2018  ? 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/2015  ? Chronic constipation 07/15/2015  ? History of iron deficiency anemia 07/15/2015  ? Migraine without aura and without status migrainosus, not intractable 05/04/2015  ? Environmental and seasonal allergies 05/04/2015  ? Obesity (BMI 30.0-34.9) 05/04/2015  ? Plantar fasciitis of left foot 05/04/2015  ? Vitamin D deficiency 05/04/2015  ? Snoring 05/04/2015  ? GERD (gastroesophageal reflux disease) 05/04/2015  ? ? ?Past Surgical History:  ?Procedure Laterality Date  ? COLONOSCOPY WITH PROPOFOL N/A 12/30/2020  ? Procedure: COLONOSCOPY WITH PROPOFOL;  Surgeon: Jonathon Bellows, MD;  Location: [facility] ENDOSCOPY;  Service: Gastroenterology;  Laterality: N/A;  ? COLPOSCOPY    ? ENDOMETRIAL ABLATION  2011  ? KNEE ARTHROSCOPY Left 11/23/2021  ? Procedure: ARTHROSCOPY KNEE, CHONDROPLASTY OF PATELLORFEMORAL JOINT, REMOVAL OF LOOSE BODIES;  Surgeon: Thornton Park, MD;  Location: [facility] ORS;  Service: Orthopedics;  Laterality: Left;  ? LEEP    ? TUBAL LIGATION  2000  ? ? ?Family History  ?Problem Relation Age of Onset  ? Sleep apnea Mother   ? Sleep apnea Father   ? Allergic rhinitis Daughter   ? Allergic rhinitis Son   ? Breast cancer Paternal Grandmother 73  ? Brain cancer Paternal Grandmother   ? Breast cancer  Maternal Aunt 80  ? Colon cancer Maternal Uncle   ? Lung cancer Maternal Uncle   ? Prostate cancer Maternal Uncle   ? Bone cancer Maternal Aunt   ? ? ?Social History  ? ?Socioeconomic History  ? Marital status: Married  ?  Spouse name: Gerald Stabs   ? Number of children: 3  ? Years of education: Not on file  ? Highest education level: Associate  degree: occupational, technical, or vocational program  ?Occupational History  ? Not on file  ?Tobacco Use  ? Smoking status: Never  ? Smokeless tobacco: Never  ?Vaping Use  ? Vaping Use: Never used  ?Substance and Sexual Activity  ? Alcohol use: No  ?  Alcohol/week: 0.0 standard drinks  ? Drug use: No  ? Sexual activity: Yes  ?  Partners: Male  ?  Birth control/protection: Other-see comments, Surgical  ?  Comment: TL  ?Other Topics Concern  ? Not on file  ?Social History Narrative  ? Not on file  ? ?Social Determinants of Health  ? ?Financial Resource Strain: Low Risk   ? Difficulty of Paying Living Expenses: Not hard at all  ?Food Insecurity: No Food Insecurity  ? Worried About Charity fundraiser in the Last Year: Never true  ? Ran Out of Food in the Last Year: Never true  ?Transportation Needs: No Transportation Needs  ? Lack of Transportation (Medical): No  ? Lack of Transportation (Non-Medical): No  ?Physical Activity: Sufficiently Active  ? Days of Exercise per Week: 5 days  ? Minutes of Exercise per Session: 30 min  ?Stress: No Stress Concern Present  ? Feeling of Stress : Not at all  ?Social Connections: Moderately Integrated  ? Frequency of Communication with Friends and Family: More than three times a week  ? Frequency of Social Gatherings with Friends and Family: More than three times a week  ? Attends Religious Services: More than 4 times per year  ? Active Member of Clubs or Organizations: No  ? Attends Archivist Meetings: Never  ? Marital Status: Married  ?Intimate Partner Violence: Not At Risk  ? Fear of Current or Ex-Partner: No  ? Emotionally Abused: No   ? Physically Abused: No  ? Sexually Abused: No  ? ? ? ?Current Outpatient Medications:  ?  aspirin EC 325 MG tablet, Take 1 tablet (325 mg total) by mouth daily., Disp: 45 tablet, Rfl: 0 ?  cetirizine (ZYRTEC)

## 2022-02-08 NOTE — Patient Instructions (Signed)

## 2022-02-09 ENCOUNTER — Encounter: Payer: Self-pay | Admitting: Family Medicine

## 2022-02-09 ENCOUNTER — Ambulatory Visit (INDEPENDENT_AMBULATORY_CARE_PROVIDER_SITE_OTHER): Payer: No Typology Code available for payment source | Admitting: Family Medicine

## 2022-02-09 VITALS — BP 122/68 | HR 73 | Resp 16 | Ht 62.0 in | Wt 157.0 lb

## 2022-02-09 DIAGNOSIS — Z130 Encounter for screening for diseases of the blood and blood-forming organs and certain disorders involving the immune mechanism: Secondary | ICD-10-CM

## 2022-02-09 DIAGNOSIS — Z79899 Other long term (current) drug therapy: Secondary | ICD-10-CM

## 2022-02-09 DIAGNOSIS — R7303 Prediabetes: Secondary | ICD-10-CM

## 2022-02-09 DIAGNOSIS — E663 Overweight: Secondary | ICD-10-CM

## 2022-02-09 DIAGNOSIS — Z0000 Encounter for general adult medical examination without abnormal findings: Secondary | ICD-10-CM

## 2022-02-09 DIAGNOSIS — Z8639 Personal history of other endocrine, nutritional and metabolic disease: Secondary | ICD-10-CM

## 2022-02-09 DIAGNOSIS — Z13220 Encounter for screening for lipoid disorders: Secondary | ICD-10-CM

## 2022-02-10 LAB — HEMOGLOBIN A1C
Hgb A1c MFr Bld: 5.5 % of total Hgb (ref <5.7)
Mean Plasma Glucose: 111 mg/dL
eAG (mmol/L): 6.2 mmol/L

## 2022-02-10 LAB — CBC WITH DIFFERENTIAL/PLATELET
Absolute Monocytes: 440 cells/uL (ref 200–950)
Basophils Absolute: 90 cells/uL (ref 0–200)
Basophils Relative: 1.7 %
Eosinophils Absolute: 138 cells/uL (ref 15–500)
Eosinophils Relative: 2.6 %
HCT: 36.2 % (ref 35.0–45.0)
Hemoglobin: 11.6 g/dL — ABNORMAL LOW (ref 11.7–15.5)
Lymphs Abs: 2258 cells/uL (ref 850–3900)
MCH: 27.1 pg (ref 27.0–33.0)
MCHC: 32 g/dL (ref 32.0–36.0)
MCV: 84.6 fL (ref 80.0–100.0)
MPV: 10.1 fL (ref 7.5–12.5)
Monocytes Relative: 8.3 %
Neutro Abs: 2374 cells/uL (ref 1500–7800)
Neutrophils Relative %: 44.8 %
Platelets: 383 10*3/uL (ref 140–400)
RBC: 4.28 10*6/uL (ref 3.80–5.10)
RDW: 15.3 % — ABNORMAL HIGH (ref 11.0–15.0)
Total Lymphocyte: 42.6 %
WBC: 5.3 10*3/uL (ref 3.8–10.8)

## 2022-02-10 LAB — COMPLETE METABOLIC PANEL WITH GFR
AG Ratio: 1.7 (calc) (ref 1.0–2.5)
ALT: 11 U/L (ref 6–29)
AST: 10 U/L (ref 10–35)
Albumin: 4 g/dL (ref 3.6–5.1)
Alkaline phosphatase (APISO): 73 U/L (ref 31–125)
BUN: 11 mg/dL (ref 7–25)
CO2: 28 mmol/L (ref 20–32)
Calcium: 9.2 mg/dL (ref 8.6–10.2)
Chloride: 107 mmol/L (ref 98–110)
Creat: 0.75 mg/dL (ref 0.50–0.99)
Globulin: 2.3 g/dL (calc) (ref 1.9–3.7)
Glucose, Bld: 80 mg/dL (ref 65–99)
Potassium: 4.2 mmol/L (ref 3.5–5.3)
Sodium: 141 mmol/L (ref 135–146)
Total Bilirubin: 0.6 mg/dL (ref 0.2–1.2)
Total Protein: 6.3 g/dL (ref 6.1–8.1)
eGFR: 98 mL/min/{1.73_m2} (ref >=60)

## 2022-02-10 LAB — LIPID PANEL
Cholesterol: 159 mg/dL (ref <200)
HDL: 44 mg/dL — ABNORMAL LOW (ref >=50)
LDL Cholesterol (Calc): 95 mg/dL (calc)
Non-HDL Cholesterol (Calc): 115 mg/dL (calc) (ref <130)
Total CHOL/HDL Ratio: 3.6 (calc) (ref <5.0)
Triglycerides: 102 mg/dL (ref <150)

## 2022-02-19 ENCOUNTER — Other Ambulatory Visit: Payer: Self-pay

## 2022-02-19 ENCOUNTER — Other Ambulatory Visit: Payer: Self-pay | Admitting: Family Medicine

## 2022-02-19 MED ORDER — FLUTICASONE PROPIONATE 50 MCG/ACT NA SUSP
2.0000 | Freq: Every day | NASAL | 0 refills | Status: DC
  Filled 2022-02-19: qty 16, 30d supply, fill #0

## 2022-03-07 NOTE — Progress Notes (Signed)
Name: Lori Beltran   MRN: [id number]    DOB: February 03, 1976   Date:03/08/2022       Progress Note  Subjective  Chief Complaint  Acute consultation  HPI  Obesity: she states her weight when she graduated HS was 120 lbs, She states after birth of her first child her weight stays around 150 lbs, she states after third she started to have problems staying around 150 lbs. She has tried [employer] but only lost 6 lbs,  She tried Contrave in the past but it caused nausea   Started Ozempic end of 06/2021 at a weight 185 lbs, she has lost down to 156 lbs but she would like to go down to 140 lbs. We will try switching from Ozempic to [facility] since she has noticed increase in appetite on current dose of Ozempic   GERD: she is taking Omeprazole prn only, currently not having heart burn or indigestion. Stable.    Prediabetes  she is cutting down on sweets, she denies polyphagia, polydipsia or polyuria. Last A1C down from  6 % down to 5.5 %  since started on Ozempic    OA of both knees: had left knee surgery Feb 2023 she still has some pain and antalgic gait, taking meloxicam 7.5 mg prn, she states pain better than it was prior to surgery    OSA: using CPAP very night and wakes up feeling refreshed , she states morning headaches less than once a week.   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 She has an epipen at home and has been able to avoid trigger foods  Chronic constipation: she has been taking Trulance prn, she can go up to one week without a bowel movement.   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/2015   Chronic constipation 07/15/2015   History of iron deficiency anemia 07/15/2015   Migraine without aura and  without status migrainosus, not intractable 05/04/2015   Environmental and seasonal allergies 05/04/2015   Plantar fasciitis of left foot 05/04/2015   Vitamin D deficiency 05/04/2015   GERD (gastroesophageal reflux disease) 05/04/2015    Past Surgical History:  Procedure Laterality Date   COLONOSCOPY WITH PROPOFOL N/A 12/30/2020   Procedure: COLONOSCOPY WITH PROPOFOL;  Surgeon: Jonathon Bellows, MD;  Location: [facility] ENDOSCOPY;  Service: Gastroenterology;  Laterality: N/A;   COLPOSCOPY     ENDOMETRIAL ABLATION  2011   KNEE ARTHROSCOPY Left 11/23/2021   Procedure: ARTHROSCOPY KNEE, CHONDROPLASTY OF PATELLORFEMORAL JOINT, REMOVAL OF LOOSE BODIES;  Surgeon: Thornton Park, MD;  Location: [facility] ORS;  Service: Orthopedics;  Laterality: Left;   LEEP     TUBAL LIGATION  2000    Family History  Problem Relation Age of Onset   Sleep apnea Mother    Sleep apnea Father    Allergic rhinitis Daughter    Allergic rhinitis Son    Breast cancer Paternal Grandmother 8   Brain cancer Paternal Grandmother    Breast cancer Maternal Aunt 80   Colon cancer Maternal Uncle    Lung cancer Maternal Uncle    Prostate cancer Maternal Uncle    Bone cancer Maternal Aunt     Social History   Tobacco Use   Smoking status: Never  Smokeless tobacco: Never  Substance Use Topics   Alcohol use: No    Alcohol/week: 0.0 standard drinks     Current Outpatient Medications:    cetirizine (ZYRTEC) 10 MG tablet, Take 10 mg by mouth daily as needed., Disp: , Rfl:    Cholecalciferol (VITAMIN D) 50 MCG (2000 UT) CAPS, Take 1 capsule by mouth daily., Disp: , Rfl:    EPINEPHrine 0.3 mg/0.3 mL IJ SOAJ injection, Inject 0.3 mg into the muscle as needed for anaphylaxis., Disp: 1 each, Rfl: 1   fluticasone (FLONASE) 50 MCG/ACT nasal spray, Place 2 sprays into both nostrils daily., Disp: 16 g, Rfl: 0   meloxicam (MOBIC) 7.5 MG tablet, Take 1 tablet every day by oral route., Disp: 60 tablet, Rfl: 1   Multiple  Vitamins-Minerals (MULTIVITAL) tablet, Take 1 tablet by mouth daily., Disp: , Rfl:    omeprazole (PRILOSEC) 40 MG capsule, TAKE 1 CAPSULE (40 MG TOTAL) BY MOUTH DAILY. (Patient taking differently: Take 40 mg by mouth daily as needed.), Disp: 30 capsule, Rfl: 5   Plecanatide (TRULANCE) 3 MG TABS, Take 1 tablet by mouth daily. (Patient taking differently: Take 1 tablet by mouth daily as needed.), Disp: 90 tablet, Rfl: 1   valACYclovir (VALTREX) 500 MG tablet, TAKE 1 TABLET BY MOUTH TWICE DAILY FOR 3 DAYS AS NEEDED FOR SYMPTOMS, Disp: 30 tablet, Rfl: 1  Allergies  Allergen Reactions   Peanut Butter Flavor Itching and Swelling   Shellfish Allergy Itching and Swelling   Sodium Hyaluronate (Avian)     Patient reported avian allergy    I personally reviewed active problem list, medication list, allergies, family history, social history, health maintenance with the patient/caregiver today.   ROS  Constitutional: Negative for fever or weight change.  Respiratory: Negative for cough and shortness of breath.   Cardiovascular: Negative for chest pain or palpitations.  Gastrointestinal: Negative for abdominal pain, no bowel changes.  Musculoskeletal: Negative for gait problem or joint swelling.  Skin: Negative for rash.  Neurological: Negative for dizziness or headache.  No other specific complaints in a complete review of systems (except as listed in HPI above).   Objective  Vitals:   03/08/22 0757  BP: 118/74  Pulse: 76  Resp: 14  Temp: 97.9 F (36.6 C)  TempSrc: Oral  SpO2: 99%  Weight: 156 lb (70.8 kg)  Height: '5\' 2"'  (1.575 m)    Body mass index is 28.53 kg/m.  Physical Exam  Constitutional: Patient appears well-developed and well-nourished. Overweight.  No distress.  HEENT: head atraumatic, normocephalic, pupils equal and reactive to light, neck supple Cardiovascular: Normal rate, regular rhythm and normal heart sounds.  No murmur heard. No BLE edema. Pulmonary/Chest: Effort  normal and breath sounds normal. No respiratory distress. Abdominal: Soft.  There is no tenderness. Psychiatric: Patient has a normal mood and affect. behavior is normal. Judgment and thought content normal.   Recent Results (from the past 2160 hour(s))  Lipid panel     Status: Abnormal   Collection Time: 02/09/22  8:10 AM  Result Value Ref Range   Cholesterol 159 <200 mg/dL   HDL 44 (L) > OR = 50 mg/dL   Triglycerides 102 <150 mg/dL   LDL Cholesterol (Calc) 95 mg/dL (calc)    Comment: Reference range: <100 . Desirable range <100 mg/dL for primary prevention;   <70 mg/dL for patients with CHD or diabetic patients  with > or = 2 CHD risk factors. Marland Kitchen LDL-C is now calculated using the Martin-Hopkins  calculation, which  is a validated novel method providing  better accuracy than the Friedewald equation in the  estimation of LDL-C.  Cresenciano Genre et al. Annamaria Helling. 4818;563(14): 2061-2068  (http://education.[employer].com/faq/FAQ164)    Total CHOL/HDL Ratio 3.6 <5.0 (calc)   Non-HDL Cholesterol (Calc) 115 <130 mg/dL (calc)    Comment: For patients with diabetes plus 1 major ASCVD risk  factor, treating to a non-HDL-C goal of <100 mg/dL  (LDL-C of <70 mg/dL) is considered a therapeutic  option.   COMPLETE METABOLIC PANEL WITH GFR     Status: None   Collection Time: 02/09/22  8:10 AM  Result Value Ref Range   Glucose, Bld 80 65 - 99 mg/dL    Comment: .            Fasting reference interval .    BUN 11 7 - 25 mg/dL   Creat 0.75 0.50 - 0.99 mg/dL   eGFR 98 > OR = 60 mL/min/1.41m    Comment: The eGFR is based on the CKD-EPI 2021 equation. To calculate  the new eGFR from a previous Creatinine or Cystatin C result, go to https://www.kidney.org/professionals/ kdoqi/gfr%5Fcalculator    BUN/Creatinine Ratio NOT APPLICABLE 6 - 22 (calc)   Sodium 141 135 - 146 mmol/L   Potassium 4.2 3.5 - 5.3 mmol/L   Chloride 107 98 - 110 mmol/L   CO2 28 20 - 32 mmol/L   Calcium 9.2 8.6 - 10.2 mg/dL    Total Protein 6.3 6.1 - 8.1 g/dL   Albumin 4.0 3.6 - 5.1 g/dL   Globulin 2.3 1.9 - 3.7 g/dL (calc)   AG Ratio 1.7 1.0 - 2.5 (calc)   Total Bilirubin 0.6 0.2 - 1.2 mg/dL   Alkaline phosphatase (APISO) 73 31 - 125 U/L   AST 10 10 - 35 U/L   ALT 11 6 - 29 U/L  CBC with Differential/Platelet     Status: Abnormal   Collection Time: 02/09/22  8:10 AM  Result Value Ref Range   WBC 5.3 3.8 - 10.8 Thousand/uL   RBC 4.28 3.80 - 5.10 Million/uL   Hemoglobin 11.6 (L) 11.7 - 15.5 g/dL   HCT 36.2 35.0 - 45.0 %   MCV 84.6 80.0 - 100.0 fL   MCH 27.1 27.0 - 33.0 pg   MCHC 32.0 32.0 - 36.0 g/dL   RDW 15.3 (H) 11.0 - 15.0 %   Platelets 383 140 - 400 Thousand/uL   MPV 10.1 7.5 - 12.5 fL   Neutro Abs 2,374 1,500 - 7,800 cells/uL   Lymphs Abs 2,258 850 - 3,900 cells/uL   Absolute Monocytes 440 200 - 950 cells/uL   Eosinophils Absolute 138 15 - 500 cells/uL   Basophils Absolute 90 0 - 200 cells/uL   Neutrophils Relative % 44.8 %   Total Lymphocyte 42.6 %   Monocytes Relative 8.3 %   Eosinophils Relative 2.6 %   Basophils Relative 1.7 %  Hemoglobin A1c     Status: None   Collection Time: 02/09/22  8:10 AM  Result Value Ref Range   Hgb A1c MFr Bld 5.5 <5.7 % of total Hgb    Comment: For the purpose of screening for the presence of diabetes: . <5.7%       Consistent with the absence of diabetes 5.7-6.4%    Consistent with increased risk for diabetes             (prediabetes) > or =6.5%  Consistent with diabetes . This assay result is consistent with a decreased risk of diabetes. .Marland Kitchen  Currently, no consensus exists regarding use of hemoglobin A1c for diagnosis of diabetes in children. . According to American Diabetes Association (ADA) guidelines, hemoglobin A1c <7.0% represents optimal control in non-pregnant diabetic patients. Different metrics may apply to specific patient populations.  Standards of Medical Care in Diabetes(ADA). .    Mean Plasma Glucose 111 mg/dL   eAG (mmol/L) 6.2  mmol/L    PHQ2/9:    03/08/2022    7:59 AM 02/09/2022    7:46 AM 10/27/2021   10:53 AM 10/12/2021   12:28 PM 09/12/2021    2:29 PM  Depression screen PHQ 2/9  Decreased Interest 0 0 0 0 0  Down, Depressed, Hopeless 0 0 0 0 0  PHQ - 2 Score 0 0 0 0 0  Altered sleeping 0 0 0  0  Tired, decreased energy 0 0 0  0  Change in appetite 0 0 0  0  Feeling bad or failure about yourself  0 0 0  0  Trouble concentrating 0 0 0  0  Moving slowly or fidgety/restless 0 0 0  0  Suicidal thoughts 0 0 0  0  PHQ-9 Score 0 0 0  0  Difficult doing work/chores   Not difficult at all      phq 9 is negative   Fall Risk:    03/08/2022    7:59 AM 02/09/2022    7:46 AM 10/27/2021   10:53 AM 10/12/2021   12:27 PM 09/12/2021    2:29 PM  Fall Risk   Falls in the past year? 0 0 0 0 0  Number falls in past yr:  0 0 0 0  Injury with Fall?  0 0 0 0  Risk for fall due to : No Fall Risks No Fall Risks No Fall Risks  No Fall Risks  Follow up Falls prevention discussed Falls prevention discussed Falls prevention discussed Falls evaluation completed Falls prevention discussed      Functional Status Survey: Is the patient deaf or have difficulty hearing?: No Does the patient have difficulty seeing, even when wearing glasses/contacts?: No Does the patient have difficulty concentrating, remembering, or making decisions?: No Does the patient have difficulty walking or climbing stairs?: Yes Does the patient have difficulty dressing or bathing?: No Does the patient have difficulty doing errands alone such as visiting a doctor's office or shopping?: No   Assessment & Plan  Problem List Items Addressed This Visit     Migraine without aura and without status migrainosus, not intractable    No longer having symptoms       Chronic constipation    Taking Trulance prn        Angioedema    No recent episodes, avoiding triggers - shellfish, peanuts..       OSA on CPAP    Compliant with CPAP         History of left knee surgery   Pre-diabetes    A1C back to normal since started on ozempic 09/22       History of obesity    She has been on Ozempic since 06/2021 , starting weight 185 lbs, down 35 lbs but no longer responding to current dose we will switch to [facility] She has OA knee, OSA and prediabetes as co-morbidities        Relevant Medications   Semaglutide-Weight Management (WEGOVY) 1.7 MG/0.75ML SOAJ   Overweight (BMI 25.0-29.9) - Primary   Relevant Medications   Semaglutide-Weight Management (WEGOVY) 1.7 MG/0.75ML SOAJ

## 2022-03-08 ENCOUNTER — Encounter: Payer: Self-pay | Admitting: Family Medicine

## 2022-03-08 ENCOUNTER — Other Ambulatory Visit: Payer: Self-pay

## 2022-03-08 ENCOUNTER — Ambulatory Visit (INDEPENDENT_AMBULATORY_CARE_PROVIDER_SITE_OTHER): Payer: No Typology Code available for payment source | Admitting: Family Medicine

## 2022-03-08 VITALS — BP 118/74 | HR 76 | Temp 97.9°F | Resp 14 | Ht 62.0 in | Wt 156.0 lb

## 2022-03-08 DIAGNOSIS — Z9989 Dependence on other enabling machines and devices: Secondary | ICD-10-CM

## 2022-03-08 DIAGNOSIS — Z9889 Other specified postprocedural states: Secondary | ICD-10-CM | POA: Diagnosis not present

## 2022-03-08 DIAGNOSIS — R7303 Prediabetes: Secondary | ICD-10-CM

## 2022-03-08 DIAGNOSIS — Z8639 Personal history of other endocrine, nutritional and metabolic disease: Secondary | ICD-10-CM

## 2022-03-08 DIAGNOSIS — G43009 Migraine without aura, not intractable, without status migrainosus: Secondary | ICD-10-CM

## 2022-03-08 DIAGNOSIS — E663 Overweight: Secondary | ICD-10-CM

## 2022-03-08 DIAGNOSIS — G4733 Obstructive sleep apnea (adult) (pediatric): Secondary | ICD-10-CM

## 2022-03-08 DIAGNOSIS — T783XXD Angioneurotic edema, subsequent encounter: Secondary | ICD-10-CM

## 2022-03-08 DIAGNOSIS — K5909 Other constipation: Secondary | ICD-10-CM

## 2022-03-08 MED ORDER — WEGOVY 1.7 MG/0.75ML ~~LOC~~ SOAJ
1.7000 mg | SUBCUTANEOUS | 2 refills | Status: DC
  Filled 2022-03-08 – 2022-03-15 (×2): qty 3, 28d supply, fill #0
  Filled 2022-05-04: qty 3, 28d supply, fill #1
  Filled 2022-05-28: qty 3, 28d supply, fill #2

## 2022-03-08 NOTE — Assessment & Plan Note (Signed)
She has been on Ozempic since 06/2021 , starting weight 185 lbs, down 35 lbs but no longer responding to current dose we will switch to [facility] She has OA knee, OSA and prediabetes as co-morbidities

## 2022-03-08 NOTE — Assessment & Plan Note (Signed)
No recent episodes, avoiding triggers - shellfish, peanuts.Marland Kitchen

## 2022-03-08 NOTE — Assessment & Plan Note (Signed)
A1C back to normal since started on ozempic 09/22

## 2022-03-08 NOTE — Assessment & Plan Note (Signed)
Compliant with CPAP 

## 2022-03-08 NOTE — Assessment & Plan Note (Signed)
No longer having symptoms

## 2022-03-08 NOTE — Assessment & Plan Note (Signed)
Taking Trulance prn

## 2022-03-15 ENCOUNTER — Other Ambulatory Visit: Payer: Self-pay

## 2022-03-16 IMAGING — MR MR KNEE*L* W/O CM
6 series · 40 of 40 positions shown · non-contrast
Comparison: X-ray knee 02/07/2021.

CLINICAL DATA: Left medial knee pain with swelling and patient
reports popping noise when turning knee with a burning sensation.
Patient notes long history of osteoarthritis.

EXAM:
MRI OF THE LEFT KNEE WITHOUT CONTRAST
TECHNIQUE: Multiplanar, multisequence MR imaging of the knee was performed. No
intravenous contrast was administered.

[Series 9: T1 · coronal · left · 4.0mm · 0.47mm/px · 7 of 32 slices shown]
[im 1/32]
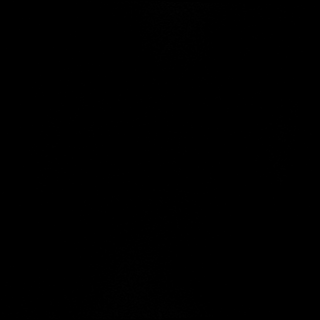
[im 6/32]
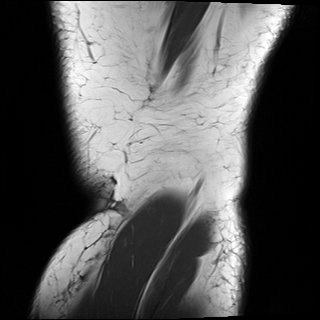
[im 11/32]
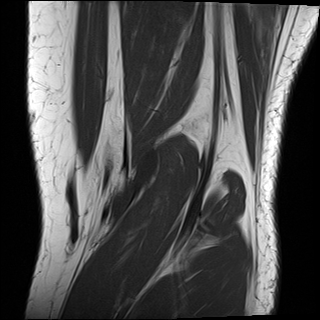
[im 16/32]
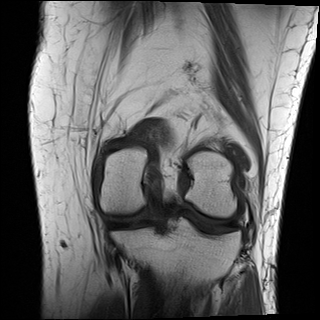
[im 21/32]
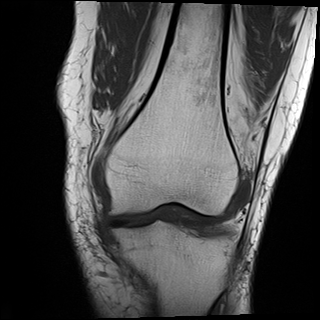
[im 26/32]
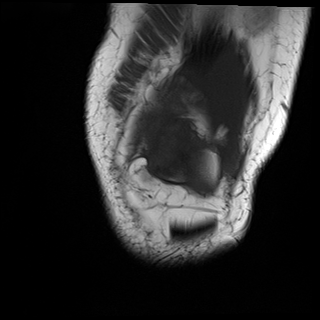
[im 32/32]
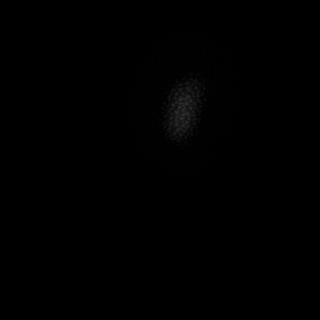

[Series 11: PD fat-sat · sagittal · left · 3.0mm · 0.47mm/px · 7 of 35 slices shown (1 of 2)]
[im 1/35]
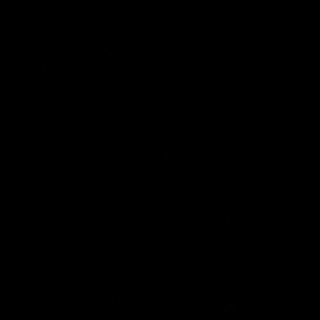
[im 6/35]
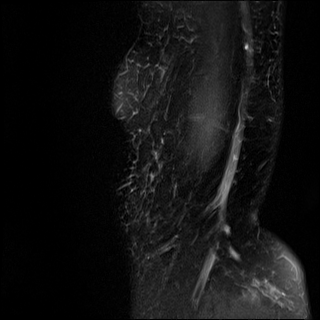
[im 12/35]
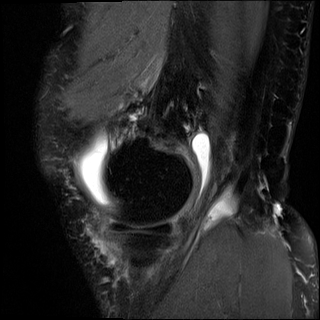
[im 18/35]
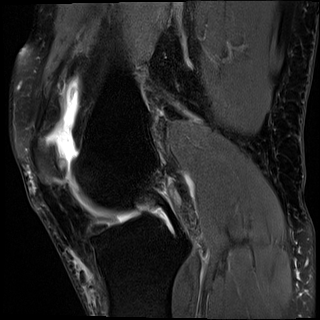
[im 23/35]
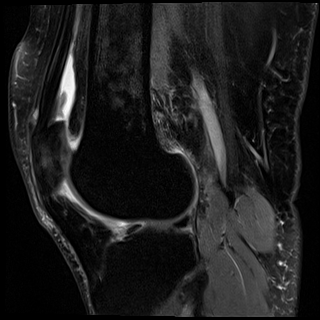
[im 29/35]
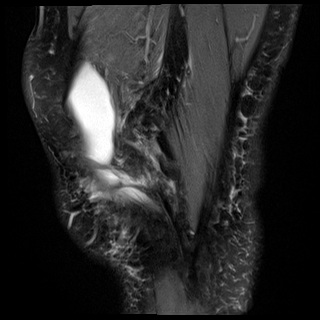
[im 35/35]
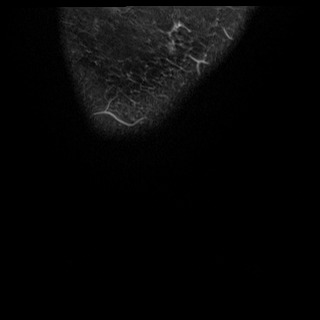

[Series 12: PD fat-sat · coronal · left · 4.0mm · 0.59mm/px · 7 of 32 slices shown (2 of 2)]
[im 1/32]
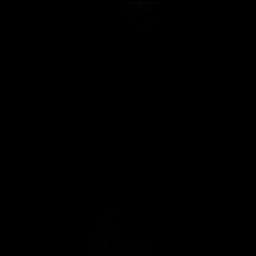
[im 6/32]
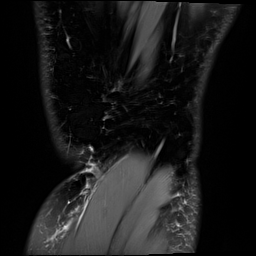
[im 11/32]
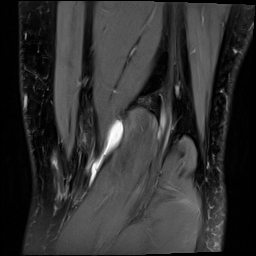
[im 16/32]
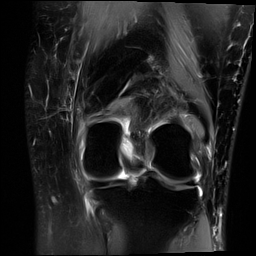
[im 21/32]
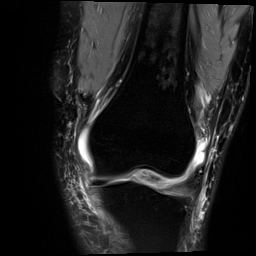
[im 26/32]
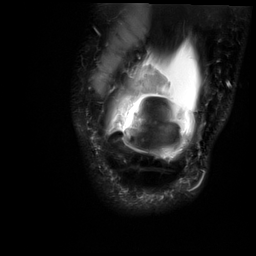
[im 32/32]
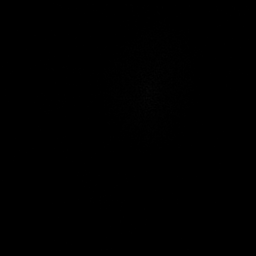

[Series 13: T2 fat-sat · sagittal · left · 3.0mm · 0.47mm/px · 7 of 35 slices shown (1 of 3)]
[im 1/35]
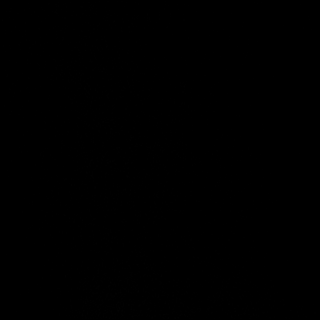
[im 6/35]
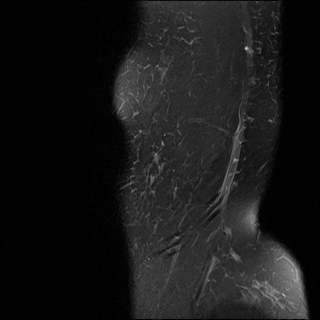
[im 12/35]
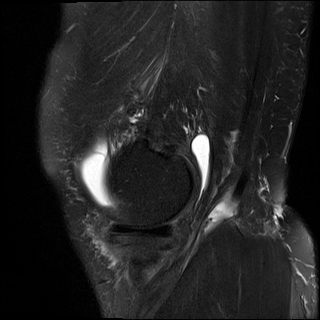
[im 18/35]
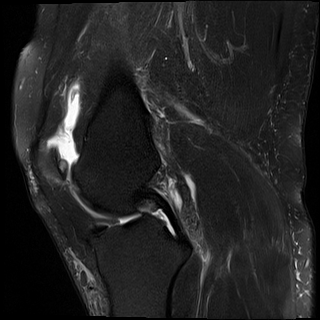
[im 23/35]
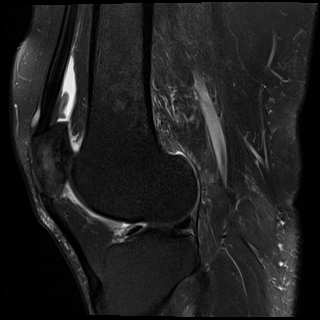
[im 29/35]
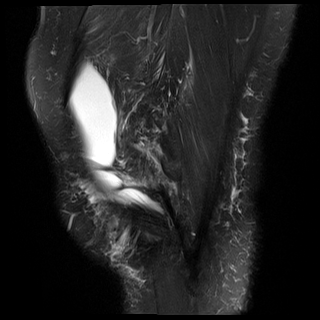
[im 35/35]
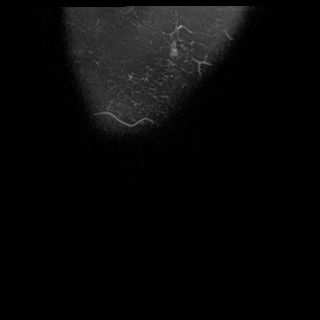

[Series 14: T2 fat-sat · axial · left · 4.0mm · 0.50mm/px · z∈[-81,+42]mm · 5 of 26 slices shown (2 of 3)]
[im 1/26]
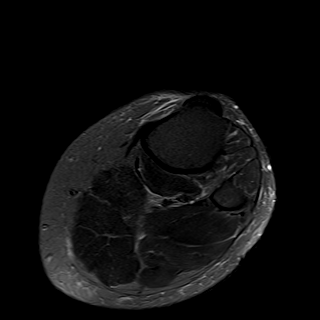
[im 7/26]
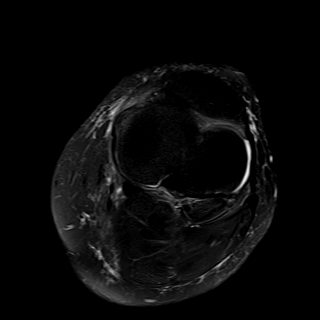
[im 13/26]
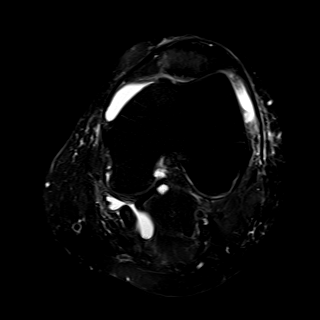
[im 19/26]
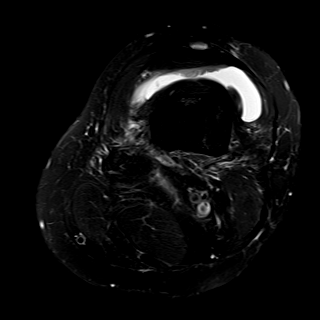
[im 26/26]
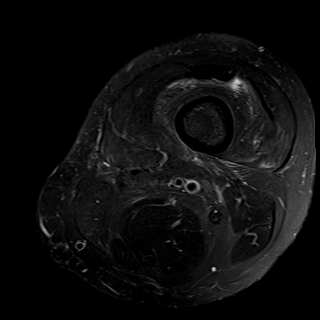

[Series 15: T2 fat-sat · coronal · left · 4.0mm · 0.47mm/px · 7 of 32 slices shown (3 of 3)]
[im 1/32]
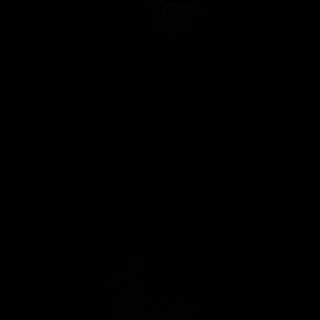
[im 6/32]
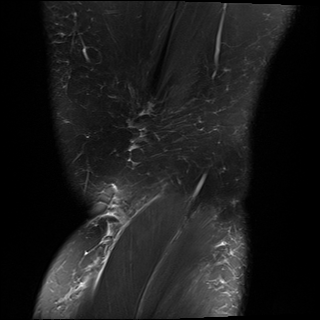
[im 11/32]
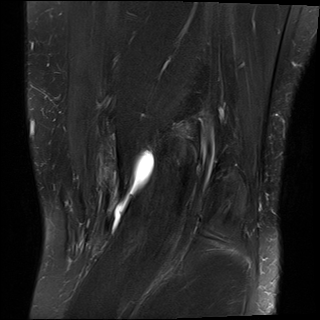
[im 16/32]
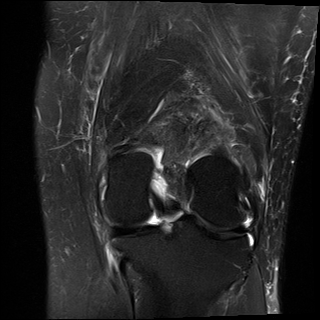
[im 21/32]
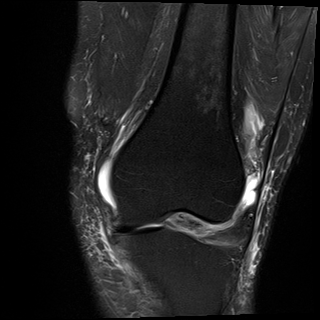
[im 26/32]
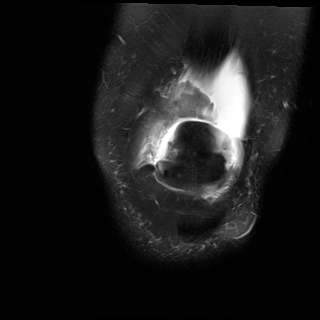
[im 32/32]
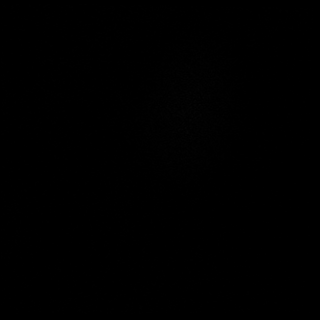

[40 of 40 positions shown; findings below may reference images not displayed]

FINDINGS: MENISCI

Medial: There is free edge fraying of the posterior horn of the
medial meniscus.

Lateral: Intact.

LIGAMENTS

Cruciates: ACL and PCL are intact.

Collaterals: The medial collateral ligament is intact. Lateral
collateral ligament complex is intact.

CARTILAGE

Patellofemoral: There are intermediate grade chondral defects of the
medial patellar facet and medial trochlea measuring up to 8 mm
(axial T2 image 15, sagittal T2 image 20). Underlying bony edema in
the patella.

Medial:  Mild chondrosis.

Lateral:  Mild chondrosis.

JOINT: Moderate-sized joint effusion. There are possible loose
chondral joint bodies within the suprapatellar joint, measuring up
to 1.0 cm.

POPLITEAL FOSSA: Small Baker's cyst.

EXTENSOR MECHANISM: Thin localized fluid along the anterior surface
of the distal quadriceps tendon measuring 0.4 cm short axis and
cm in length (axial T2 image 9, sagittal T2 image 23). Intact
patellar tendon.

BONES: No aggressive osseous lesion. No fracture or dislocation.
Tricompartment osteophyte formation.

Other: Mild generalized swelling along the knee.
IMPRESSION: 1. Free edge fraying of the posterior horn of the medial meniscus.
Intact lateral meniscus.
2. Tricompartment osteoarthritis, worst in the patellofemoral
compartment, with notable chondral defects of the medial trochlea
and medial patellar facet.
3. Moderate-sized joint effusion with possible chondral joint bodies
within the suprapatellar joint measuring up to 1.0 cm. Small Baker's
cyst.
4. Focal, thin localized fluid along the anterior surface of the
distal quadriceps tendon measuring 0.4 cm short axis and 1.3 cm in
length. This would be an atypical location for bursitis, and this
may represent a tiny hematoma, correlate with point tenderness.

## 2022-03-26 ENCOUNTER — Other Ambulatory Visit: Payer: Self-pay

## 2022-03-28 ENCOUNTER — Telehealth (INDEPENDENT_AMBULATORY_CARE_PROVIDER_SITE_OTHER): Payer: No Typology Code available for payment source | Admitting: Family Medicine

## 2022-03-28 DIAGNOSIS — R112 Nausea with vomiting, unspecified: Secondary | ICD-10-CM

## 2022-03-28 NOTE — Progress Notes (Signed)
Virtual Visit via Telephone Note  I connected with Lori Beltran on 03/28/22 at  8:40 AM EDT by telephone and verified that I am speaking with the correct person using two identifiers.  Location: Patient: home Provider: [facility]   I discussed the limitations, risks, security and privacy concerns of performing an evaluation and management service by telephone and the availability of in person appointments. I also discussed with the patient that there may be a patient responsible charge related to this service. The patient expressed understanding and agreed to proceed.   History of Present Illness:  ABDOMINAL ISSUES - woke this morning around 3am with R sided lower abdominal pain, 10/10. Took pepto bismol with some relief in pain ~5/10 but 20 mins later had vomiting and diarrhea, no blood. Has vomited twice since then.  - pain located in RLQ sometimes radiating up into upper abdomen.  - denies known sick contacts - had chicken and rice last night. Denies new or suspicious foods. - no fever, blood in stool or vomit. - hurts to move some but able to get around.  - previously tolerated ozempic, hasn't started wegovy yet. Only takes trulance prn, no doses recently. No other recent med changes.  Treatments attempted: pepto bismol Constipation: no Diarrhea: yes Nausea: yes Vomiting: yes Melena or hematochezia: no Rash: no Jaundice: no Fever: no    Observations/Objective:  Entirety of visit conducted over the phone.  Speaks in full sentences, no respiratory distress.  Assessment and Plan:  Nausea, vomiting Likely viral etiology given duration and character of symptoms. Given improvement in pain with pepto bismol and able to move around without exquisite pain, unlikely acute surgical abdomen. Passing BM, unlikely bowel obstruction. No recent med changes to concern for medication side effect/intolerance. Recommend supportive care with adequate oral hydration, has zofran leftover from  prior surgery. Strict return and emergency precautions discussed should symptoms worsen or change.     I discussed the assessment and treatment plan with the patient. The patient was provided an opportunity to ask questions and all were answered. The patient agreed with the plan and demonstrated an understanding of the instructions.   The patient was advised to call back or seek an in-person evaluation if the symptoms worsen or if the condition fails to improve as anticipated.  I provided 8 minutes of non-face-to-face time during this encounter.   Myles Gip, DO

## 2022-03-29 ENCOUNTER — Encounter: Payer: Self-pay | Admitting: Nurse Practitioner

## 2022-03-29 ENCOUNTER — Other Ambulatory Visit: Payer: Self-pay

## 2022-03-29 ENCOUNTER — Other Ambulatory Visit: Admit: 2022-03-29 | Payer: No Typology Code available for payment source

## 2022-03-29 ENCOUNTER — Encounter
Admission: EM | Disposition: A | Discharge status: Home / Self Care | Payer: Self-pay | Source: Home / Self Care | Attending: Student in an Organized Health Care Education/Training Program

## 2022-03-29 ENCOUNTER — Emergency Department: Payer: No Typology Code available for payment source

## 2022-03-29 ENCOUNTER — Ambulatory Visit (INDEPENDENT_AMBULATORY_CARE_PROVIDER_SITE_OTHER): Payer: No Typology Code available for payment source | Admitting: Nurse Practitioner

## 2022-03-29 ENCOUNTER — Emergency Department: Payer: No Typology Code available for payment source | Admitting: Certified Registered Nurse Anesthetist

## 2022-03-29 ENCOUNTER — Observation Stay
Admission: EM | Admit: 2022-03-29 | Discharge: 2022-03-30 | Disposition: A | Discharge status: Home / Self Care | Payer: No Typology Code available for payment source | Attending: Obstetrics and Gynecology | Admitting: Obstetrics and Gynecology

## 2022-03-29 VITALS — BP 124/68 | HR 99 | Temp 99.0°F | Resp 16 | Ht 62.0 in | Wt 153.0 lb

## 2022-03-29 DIAGNOSIS — N83201 Unspecified ovarian cyst, right side: Secondary | ICD-10-CM | POA: Diagnosis not present

## 2022-03-29 DIAGNOSIS — Z79899 Other long term (current) drug therapy: Secondary | ICD-10-CM | POA: Insufficient documentation

## 2022-03-29 DIAGNOSIS — R7303 Prediabetes: Secondary | ICD-10-CM | POA: Diagnosis not present

## 2022-03-29 DIAGNOSIS — N83202 Unspecified ovarian cyst, left side: Secondary | ICD-10-CM

## 2022-03-29 DIAGNOSIS — N9489 Other specified conditions associated with female genital organs and menstrual cycle: Secondary | ICD-10-CM

## 2022-03-29 DIAGNOSIS — K661 Hemoperitoneum: Secondary | ICD-10-CM | POA: Insufficient documentation

## 2022-03-29 DIAGNOSIS — R1031 Right lower quadrant pain: Secondary | ICD-10-CM | POA: Diagnosis not present

## 2022-03-29 DIAGNOSIS — N838 Other noninflammatory disorders of ovary, fallopian tube and broad ligament: Secondary | ICD-10-CM | POA: Insufficient documentation

## 2022-03-29 DIAGNOSIS — Z7984 Long term (current) use of oral hypoglycemic drugs: Secondary | ICD-10-CM | POA: Diagnosis not present

## 2022-03-29 DIAGNOSIS — D259 Leiomyoma of uterus, unspecified: Secondary | ICD-10-CM | POA: Diagnosis not present

## 2022-03-29 HISTORY — PX: LAPAROSCOPY: SHX197

## 2022-03-29 LAB — CBC WITH DIFFERENTIAL/PLATELET
Abs Immature Granulocytes: 0.04 10*3/uL (ref 0.00–0.07)
Basophils Absolute: 0.1 10*3/uL (ref 0.0–0.1)
Basophils Relative: 1 %
Eosinophils Absolute: 0 10*3/uL (ref 0.0–0.5)
Eosinophils Relative: 0 %
HCT: 34.3 % — ABNORMAL LOW (ref 36.0–46.0)
Hemoglobin: 10.9 g/dL — ABNORMAL LOW (ref 12.0–15.0)
Immature Granulocytes: 0 %
Lymphocytes Relative: 11 %
Lymphs Abs: 1.5 10*3/uL (ref 0.7–4.0)
MCH: 26.8 pg (ref 26.0–34.0)
MCHC: 31.8 g/dL (ref 30.0–36.0)
MCV: 84.3 fL (ref 80.0–100.0)
Monocytes Absolute: 0.6 10*3/uL (ref 0.1–1.0)
Monocytes Relative: 5 %
Neutro Abs: 11.6 10*3/uL — ABNORMAL HIGH (ref 1.7–7.7)
Neutrophils Relative %: 83 %
Platelets: 383 10*3/uL (ref 150–400)
RBC: 4.07 MIL/uL (ref 3.87–5.11)
RDW: 14.9 % (ref 11.5–15.5)
WBC: 13.8 10*3/uL — ABNORMAL HIGH (ref 4.0–10.5)
nRBC: 0 % (ref 0.0–0.2)

## 2022-03-29 LAB — BASIC METABOLIC PANEL
Anion gap: 8 (ref 5–15)
BUN: 10 mg/dL (ref 6–20)
CO2: 25 mmol/L (ref 22–32)
Calcium: 9 mg/dL (ref 8.9–10.3)
Chloride: 105 mmol/L (ref 98–111)
Creatinine, Ser: 0.84 mg/dL (ref 0.44–1.00)
GFR, Estimated: >60 mL/min (ref >60)
Glucose, Bld: 104 mg/dL — ABNORMAL HIGH (ref 70–99)
Potassium: 3.8 mmol/L (ref 3.5–5.1)
Sodium: 138 mmol/L (ref 135–145)

## 2022-03-29 LAB — URINALYSIS, ROUTINE W REFLEX MICROSCOPIC
Bilirubin Urine: NEGATIVE
Glucose, UA: NEGATIVE mg/dL
Ketones, ur: 20 mg/dL — AB
Leukocytes,Ua: NEGATIVE
Nitrite: NEGATIVE
Protein, ur: NEGATIVE mg/dL
Specific Gravity, Urine: >1.046 — ABNORMAL HIGH (ref 1.005–1.030)
pH: 6 (ref 5.0–8.0)

## 2022-03-29 LAB — TYPE AND SCREEN
ABO/RH(D): O POS
Antibody Screen: NEGATIVE

## 2022-03-29 LAB — HCG, QUANTITATIVE, PREGNANCY: hCG, Beta Chain, Quant, S: <1 m[IU]/mL (ref <5)

## 2022-03-29 LAB — LIPASE, BLOOD: Lipase: 23 U/L (ref 11–51)

## 2022-03-29 SURGERY — LAPAROSCOPY, DIAGNOSTIC
Anesthesia: General | Laterality: Right

## 2022-03-29 MED ORDER — BUPIVACAINE HCL (PF) 0.5 % IJ SOLN
INTRAMUSCULAR | Status: AC
  Filled 2022-03-29: qty 30

## 2022-03-29 MED ORDER — DEXAMETHASONE SODIUM PHOSPHATE 10 MG/ML IJ SOLN
INTRAMUSCULAR | Status: DC | PRN
  Administered 2022-03-29: 10 mg via INTRAVENOUS

## 2022-03-29 MED ORDER — FENTANYL CITRATE (PF) 100 MCG/2ML IJ SOLN
INTRAMUSCULAR | Status: AC
  Filled 2022-03-29: qty 2

## 2022-03-29 MED ORDER — ONDANSETRON HCL 4 MG/2ML IJ SOLN: 4.0000 mg | Freq: Once | INTRAMUSCULAR | Status: DC

## 2022-03-29 MED ORDER — IOHEXOL 300 MG/ML  SOLN
100.0000 mL | Freq: Once | INTRAMUSCULAR | Status: AC | PRN
  Administered 2022-03-29: 100 mL via INTRAVENOUS

## 2022-03-29 MED ORDER — LACTATED RINGERS IV SOLN: INTRAVENOUS | Status: DC

## 2022-03-29 MED ORDER — MIDAZOLAM HCL 2 MG/2ML IJ SOLN
INTRAMUSCULAR | Status: AC
  Filled 2022-03-29: qty 2

## 2022-03-29 MED ORDER — ONDANSETRON HCL 4 MG/2ML IJ SOLN
4.0000 mg | Freq: Once | INTRAMUSCULAR | Status: AC
  Administered 2022-03-29: 4 mg via INTRAVENOUS
  Filled 2022-03-29: qty 2

## 2022-03-29 MED ORDER — MIDAZOLAM HCL 2 MG/2ML IJ SOLN
INTRAMUSCULAR | Status: DC | PRN
  Administered 2022-03-29: 2 mg via INTRAVENOUS

## 2022-03-29 MED ORDER — FENTANYL CITRATE (PF) 100 MCG/2ML IJ SOLN
INTRAMUSCULAR | Status: DC | PRN
  Administered 2022-03-29 (×2): 50 ug via INTRAVENOUS

## 2022-03-29 MED ORDER — LIDOCAINE HCL (CARDIAC) PF 100 MG/5ML IV SOSY
PREFILLED_SYRINGE | INTRAVENOUS | Status: DC | PRN
  Administered 2022-03-29: 70 mg via INTRAVENOUS

## 2022-03-29 MED ORDER — 0.9 % SODIUM CHLORIDE (POUR BTL) OPTIME
TOPICAL | Status: DC | PRN
  Administered 2022-03-29: 500 mL

## 2022-03-29 MED ORDER — ACETAMINOPHEN 500 MG PO TABS: 1000.0000 mg | ORAL_TABLET | ORAL | Status: DC

## 2022-03-29 MED ORDER — LACTATED RINGERS IV SOLN: INTRAVENOUS | Status: DC | PRN

## 2022-03-29 MED ORDER — PROPOFOL 10 MG/ML IV BOLUS
INTRAVENOUS | Status: DC | PRN
  Administered 2022-03-29: 150 mg via INTRAVENOUS

## 2022-03-29 MED ORDER — PHENYLEPHRINE 80 MCG/ML (10ML) SYRINGE FOR IV PUSH (FOR BLOOD PRESSURE SUPPORT)
PREFILLED_SYRINGE | INTRAVENOUS | Status: DC | PRN
  Administered 2022-03-29: 160 ug via INTRAVENOUS
  Administered 2022-03-30: 80 ug via INTRAVENOUS

## 2022-03-29 MED ORDER — GABAPENTIN 300 MG PO CAPS: 300.0000 mg | ORAL_CAPSULE | ORAL | Status: DC

## 2022-03-29 MED ORDER — MORPHINE SULFATE (PF) 4 MG/ML IV SOLN
4.0000 mg | INTRAVENOUS | Status: DC | PRN
  Administered 2022-03-29: 4 mg via INTRAVENOUS
  Filled 2022-03-29: qty 1

## 2022-03-29 MED ORDER — ROCURONIUM BROMIDE 100 MG/10ML IV SOLN
INTRAVENOUS | Status: DC | PRN
  Administered 2022-03-29: 10 mg via INTRAVENOUS
  Administered 2022-03-29 (×2): 20 mg via INTRAVENOUS

## 2022-03-29 MED ORDER — SODIUM CHLORIDE 0.9 % IV BOLUS
1000.0000 mL | Freq: Once | INTRAVENOUS | Status: AC
  Administered 2022-03-29: 1000 mL via INTRAVENOUS

## 2022-03-29 MED ORDER — PROPOFOL 10 MG/ML IV BOLUS
INTRAVENOUS | Status: AC
  Filled 2022-03-29: qty 20

## 2022-03-29 MED ORDER — ONDANSETRON HCL 4 MG/2ML IJ SOLN
INTRAMUSCULAR | Status: DC | PRN
  Administered 2022-03-29: 4 mg via INTRAVENOUS

## 2022-03-29 MED ORDER — SUCCINYLCHOLINE CHLORIDE 200 MG/10ML IV SOSY
PREFILLED_SYRINGE | INTRAVENOUS | Status: DC | PRN
  Administered 2022-03-29: 80 mg via INTRAVENOUS

## 2022-03-29 SURGICAL SUPPLY — 36 items
ANCHOR TIS RET SYS 235ML (MISCELLANEOUS) ×1 IMPLANT
BACTOSHIELD CHG 4% 4OZ (MISCELLANEOUS) ×1
BLADE SURG SZ11 CARB STEEL (BLADE) ×2 IMPLANT
CATH ROBINSON RED A/P 16FR (CATHETERS) ×2 IMPLANT
CHLORAPREP W/TINT 26 (MISCELLANEOUS) ×2 IMPLANT
DERMABOND ADVANCED (GAUZE/BANDAGES/DRESSINGS) ×1
DERMABOND ADVANCED .7 DNX12 (GAUZE/BANDAGES/DRESSINGS) ×1 IMPLANT
GAUZE 4X4 16PLY ~~LOC~~+RFID DBL (SPONGE) ×4 IMPLANT
GLOVE BIO SURGEON STRL SZ 6.5 (GLOVE) ×2 IMPLANT
GLOVE BIO SURGEON STRL SZ8 (GLOVE) ×2 IMPLANT
GLOVE SURG UNDER LTX SZ7 (GLOVE) ×2 IMPLANT
GOWN STRL REUS W/ TWL LRG LVL3 (GOWN DISPOSABLE) ×2 IMPLANT
GOWN STRL REUS W/TWL LRG LVL3 (GOWN DISPOSABLE) ×2
GOWN STRL REUS W/TWL XL LVL4 (GOWN DISPOSABLE) ×2 IMPLANT
GRASPER SUT TROCAR 14GX15 (MISCELLANEOUS) ×1 IMPLANT
IRRIGATION STRYKERFLOW (MISCELLANEOUS) ×1 IMPLANT
IRRIGATOR STRYKERFLOW (MISCELLANEOUS) ×2
IV LACTATED RINGERS 1000ML (IV SOLUTION) ×2 IMPLANT
KIT TURNOVER CYSTO (KITS) ×2 IMPLANT
MANIFOLD NEPTUNE II (INSTRUMENTS) ×2 IMPLANT
NS IRRIG 500ML POUR BTL (IV SOLUTION) ×2 IMPLANT
PACK GYN LAPAROSCOPIC (MISCELLANEOUS) ×2 IMPLANT
PAD OB MATERNITY 4.3X12.25 (PERSONAL CARE ITEMS) ×2 IMPLANT
PAD PREP 24X41 OB/GYN DISP (PERSONAL CARE ITEMS) ×2 IMPLANT
SCRUB CHG 4% DYNA-HEX 4OZ (MISCELLANEOUS) ×1 IMPLANT
SET TUBE SMOKE EVAC HIGH FLOW (TUBING) ×2 IMPLANT
SHEARS HARMONIC ACE PLUS 36CM (ENDOMECHANICALS) ×1 IMPLANT
SLEEVE ENDOPATH XCEL 5M (ENDOMECHANICALS) ×2 IMPLANT
SUT VIC AB 3-0 SH 27 (SUTURE)
SUT VIC AB 3-0 SH 27X BRD (SUTURE) IMPLANT
SUT VIC AB 4-0 SH 27 (SUTURE) ×1
SUT VIC AB 4-0 SH 27XANBCTRL (SUTURE) IMPLANT
SUT VICRYL 0 AB UR-6 (SUTURE) ×2 IMPLANT
TROCAR ENDO BLADELESS 11MM (ENDOMECHANICALS) ×2 IMPLANT
TROCAR XCEL NON-BLD 5MMX100MML (ENDOMECHANICALS) ×2 IMPLANT
WATER STERILE IRR 500ML POUR (IV SOLUTION) ×2 IMPLANT

## 2022-03-29 NOTE — H&P (Signed)
Reason for Consult: Hemoperitoneum, pelvic pain Referring Physician: Merlyn Lot, MD  Lori Beltran is an 46 y.o. [phone] female who presented to the Emergency Room with complaints of RLQ pain progressively worsening over the past 2 days with associated nausea/vomiting and diarrhea. Pain aggravated by movement. Nothing makes the pain better. CT scan performed noting large hematoma in lower pelvis, also with pelvic fluid, concern for active bleed.   Pertinent Gynecological History: Menses: regular every month without intermenstrual spotting Contraception: tubal ligation Blood transfusions: none Sexually transmitted diseases: no past history Previous GYN Procedures:  endometrial ablation   Last mammogram: normal Date: 11/27/2021 Last pap: normal Date: 11/10/2018   Menstrual History: Menarche age: 54 or 81 No LMP recorded. Patient has had an ablation.    OB History  Gravida Para Term Preterm AB Living  '3 3 3     3  '$ SAB IAB Ectopic Multiple Live Births          3    # Outcome Date GA Lbr Len/2nd Weight Sex Delivery Anes PTL Lv  3 Term           2 Term           1 Term             Past Medical History:  Diagnosis Date   Arthritis    left knee   Cervical dysplasia    CINII   Constipation    GERD (gastroesophageal reflux disease)    Herpes    Migraine    Obesity    Pap smear abnormality of vagina with ASC-US    Plantar fasciitis    Pre-diabetes    Sleep apnea    Snoring    Vitamin D deficiency     Past Surgical History:  Procedure Laterality Date   COLONOSCOPY WITH PROPOFOL N/A 12/30/2020   Procedure: COLONOSCOPY WITH PROPOFOL;  Surgeon: Jonathon Bellows, MD;  Location: [facility] ENDOSCOPY;  Service: Gastroenterology;  Laterality: N/A;   COLPOSCOPY     ENDOMETRIAL ABLATION  2011   KNEE ARTHROSCOPY Left 11/23/2021   Procedure: ARTHROSCOPY KNEE, CHONDROPLASTY OF PATELLORFEMORAL JOINT, REMOVAL OF LOOSE BODIES;  Surgeon: Thornton Park, MD;  Location: [facility] ORS;  Service:  Orthopedics;  Laterality: Left;   LEEP     TUBAL LIGATION  2000    Family History  Problem Relation Age of Onset   Sleep apnea Mother    Sleep apnea Father    Allergic rhinitis Daughter    Allergic rhinitis Son    Breast cancer Paternal Grandmother 30   Brain cancer Paternal Grandmother    Breast cancer Maternal Aunt 80   Colon cancer Maternal Uncle    Lung cancer Maternal Uncle    Prostate cancer Maternal Uncle    Bone cancer Maternal Aunt     Social History:  reports that she has never smoked. She has never used smokeless tobacco. She reports that she does not drink alcohol and does not use drugs.  Allergies:  Allergies  Allergen Reactions   Peanut Butter Flavor Itching and Swelling   Shellfish Allergy Itching and Swelling   Sodium Hyaluronate (Avian)     Patient reported avian allergy    Medications: Prior to Admission:  Medications Prior to Admission  Medication Sig Dispense Refill Last Dose   cetirizine (ZYRTEC) 10 MG tablet Take 10 mg by mouth daily as needed.      Cholecalciferol (VITAMIN D) 50 MCG (2000 UT) CAPS Take 1 capsule by mouth daily.  EPINEPHrine 0.3 mg/0.3 mL IJ SOAJ injection Inject 0.3 mg into the muscle as needed for anaphylaxis. 1 each 1    fluticasone (FLONASE) 50 MCG/ACT nasal spray Place 2 sprays into both nostrils daily. 16 g 0    meloxicam (MOBIC) 7.5 MG tablet Take 1 tablet every day by oral route. 60 tablet 1    Multiple Vitamins-Minerals (MULTIVITAL) tablet Take 1 tablet by mouth daily.      omeprazole (PRILOSEC) 40 MG capsule TAKE 1 CAPSULE (40 MG TOTAL) BY MOUTH DAILY. (Patient taking differently: Take 40 mg by mouth daily as needed.) 30 capsule 5    Plecanatide (TRULANCE) 3 MG TABS Take 1 tablet by mouth daily. (Patient taking differently: Take 1 tablet by mouth daily as needed.) 90 tablet 1    Semaglutide-Weight Management (WEGOVY) 1.7 MG/0.75ML SOAJ Inject 1.7 mg into the skin once a week. 3 mL 2    valACYclovir (VALTREX) 500 MG tablet  TAKE 1 TABLET BY MOUTH TWICE DAILY FOR 3 DAYS AS NEEDED FOR SYMPTOMS 30 tablet 1     Review of Systems  Constitutional:  Negative for chills and fever.  Respiratory:  Negative for shortness of breath and wheezing.   Cardiovascular:  Negative for chest pain and palpitations.  Gastrointestinal:  Positive for diarrhea, nausea and vomiting.  Genitourinary:  Positive for pelvic pain. Negative for dysuria, flank pain, menstrual problem, vaginal bleeding and vaginal discharge.  Skin: Negative.   Neurological:  Negative for dizziness, weakness and light-headedness.  Hematological: Negative.   Psychiatric/Behavioral: Negative.      Blood pressure 95/79, pulse (!) 103, temperature 99.5 F (37.5 C), temperature source Oral, resp. rate 20, height '5\' 2"'$  (1.575 m), weight 69.4 kg, SpO2 97 %. Physical Exam Constitutional:      Appearance: She is well-developed.  HENT:     Head: Normocephalic and atraumatic.  Eyes:     Extraocular Movements: Extraocular movements intact.     Pupils: Pupils are equal, round, and reactive to light.  Cardiovascular:     Rate and Rhythm: Normal rate and regular rhythm.     Heart sounds: Normal heart sounds.  Pulmonary:     Effort: Pulmonary effort is normal.     Breath sounds: Normal breath sounds.  Abdominal:     General: Abdomen is flat.     Palpations: Abdomen is soft.     Tenderness: There is abdominal tenderness in the right lower quadrant, periumbilical area and left lower quadrant. There is no guarding or rebound.  Genitourinary:    Comments: Deferred to OR Skin:    General: Skin is warm and dry.  Neurological:     General: No focal deficit present.     Mental Status: She is alert.  Psychiatric:        Mood and Affect: Mood normal.        Behavior: Behavior normal.     Results for orders placed or performed during the hospital encounter of 03/29/22 (from the past 48 hour(s))  Basic metabolic panel     Status: Abnormal   Collection Time: 03/29/22   6:02 PM  Result Value Ref Range   Sodium 138 135 - 145 mmol/L   Potassium 3.8 3.5 - 5.1 mmol/L   Chloride 105 98 - 111 mmol/L   CO2 25 22 - 32 mmol/L   Glucose, Bld 104 (H) 70 - 99 mg/dL    Comment: Glucose reference range applies only to samples taken after fasting for at least 8 hours.   BUN  10 6 - 20 mg/dL   Creatinine, Ser 0.84 0.44 - 1.00 mg/dL   Calcium 9.0 8.9 - 10.3 mg/dL   GFR, Estimated >60 >60 mL/min    Comment: (NOTE) Calculated using the CKD-EPI Creatinine Equation (2021)    Anion gap 8 5 - 15    Comment: Performed at [facility], [address] [place], [place] [address]  CBC with Differential     Status: Abnormal   Collection Time: 03/29/22  6:02 PM  Result Value Ref Range   WBC 13.8 (H) 4.0 - 10.5 K/uL   RBC 4.07 3.87 - 5.11 MIL/uL   Hemoglobin 10.9 (L) 12.0 - 15.0 g/dL   HCT 34.3 (L) 36.0 - 46.0 %   MCV 84.3 80.0 - 100.0 fL   MCH 26.8 26.0 - 34.0 pg   MCHC 31.8 30.0 - 36.0 g/dL   RDW 14.9 11.5 - 15.5 %   Platelets 383 150 - 400 K/uL   nRBC 0.0 0.0 - 0.2 %   Neutrophils Relative % 83 %   Neutro Abs 11.6 (H) 1.7 - 7.7 K/uL   Lymphocytes Relative 11 %   Lymphs Abs 1.5 0.7 - 4.0 K/uL   Monocytes Relative 5 %   Monocytes Absolute 0.6 0.1 - 1.0 K/uL   Eosinophils Relative 0 %   Eosinophils Absolute 0.0 0.0 - 0.5 K/uL   Basophils Relative 1 %   Basophils Absolute 0.1 0.0 - 0.1 K/uL   Immature Granulocytes 0 %   Abs Immature Granulocytes 0.04 0.00 - 0.07 K/uL    Comment: Performed at [facility], [address] [place], [place] [address]  Lipase, blood     Status: None   Collection Time: 03/29/22  6:02 PM  Result Value Ref Range   Lipase 23 11 - 51 U/L    Comment: Performed at [facility], [address] [place], [place] [address]  hCG, quantitative, pregnancy     Status: None   Collection Time: 03/29/22  6:02 PM  Result Value Ref Range   hCG, Beta Chain, Quant, S <1 <5 mIU/mL    Comment:          GEST. AGE      CONC.   (mIU/mL)   <=1 WEEK        5 - 50     2 WEEKS       50 - 500     3 WEEKS       100 - 10,000     4 WEEKS     1,000 - 30,000     5 WEEKS     3,500 - 115,000   6-8 WEEKS     12,000 - 270,000    12 WEEKS     15,000 - 220,000        FEMALE AND NON-PREGNANT FEMALE:     LESS THAN 5 mIU/mL Performed at [facility], [address] [place], [place] [address]   Type and screen [place]     Status: None   Collection Time: 03/29/22  8:04 PM  Result Value Ref Range   ABO/RH(D) O POS    Antibody Screen NEG    Sample Expiration      04/01/2022,2359 Performed at [facility], [address] [place], [place] [address]   Urinalysis, Routine w reflex microscopic Urine, Clean Catch     Status: Abnormal   Collection Time: 03/29/22  8:11 PM  Result Value Ref Range  Color, Urine STRAW (A) YELLOW   APPearance CLEAR (A) CLEAR   Specific Gravity, Urine >1.046 (H) 1.005 - 1.030   pH 6.0 5.0 - 8.0   Glucose, UA NEGATIVE NEGATIVE mg/dL   Hgb urine dipstick SMALL (A) NEGATIVE   Bilirubin Urine NEGATIVE NEGATIVE   Ketones, ur 20 (A) NEGATIVE mg/dL   Protein, ur NEGATIVE NEGATIVE mg/dL   Nitrite NEGATIVE NEGATIVE   Leukocytes,Ua NEGATIVE NEGATIVE   RBC / HPF 0-5 0 - 5 RBC/hpf   WBC, UA 0-5 0 - 5 WBC/hpf   Bacteria, UA RARE (A) NONE SEEN   Squamous Epithelial / LPF 0-5 0 - 5    Comment: Performed at [facility], [address] [place], [place] [address]    CT ABDOMEN PELVIS W CONTRAST  Result Date: 03/29/2022 CLINICAL DATA:  Right lower quadrant pain starting Tuesday. Nausea vomiting and diarrhea. EXAM: CT ABDOMEN AND PELVIS WITH CONTRAST TECHNIQUE: Multidetector CT imaging of the abdomen and pelvis was performed using the standard protocol following bolus administration of intravenous contrast. RADIATION DOSE REDUCTION: This exam was performed according to the departmental dose-optimization program which includes automated exposure control,  adjustment of the mA and/or kV according to patient size and/or use of iterative reconstruction technique. CONTRAST:  171m OMNIPAQUE IOHEXOL 300 MG/ML  SOLN COMPARISON:  None Available. FINDINGS: Lower chest: Lingular well-circumscribed 9 mm nodule on 01/06. Trace left pleural fluid. Normal heart size without pericardial or pleural effusion. Tiny hiatal hernia. Hepatobiliary: Normal liver. Normal gallbladder, without biliary ductal dilatation. Pancreas: Normal, without mass or ductal dilatation. Spleen: Normal in size, without focal abnormality. Adrenals/Urinary Tract: Normal adrenal glands. Normal kidneys, without hydronephrosis. Normal urinary bladder. Stomach/Bowel: Normal stomach, without wall thickening. Normal colon, appendix, and terminal ileum. Normal small bowel. Vascular/Lymphatic: Aortic atherosclerosis. Prominent gonadal veins, greater right than left. No abdominopelvic adenopathy. Reproductive: Small anterior uterine body fibroid including at 1.3 cm on 71/2. Right and posterior/central pelvic heterogeneous 9.2 x 9.7 cm "mass" is favored to represent primarily hematoma. This presumably obscures the right ovary, which may be positioned superiorly and anteriorly on 61/2. Contiguous small volume hemorrhage extends into the paracollic gutters including in the left upper quadrant on 24/2. Other: No free intraperitoneal air. Musculoskeletal: No acute osseous abnormality. Mild convex left lumbar spine curvature. IMPRESSION: 1. Heterogeneous right posterior pelvic mass, favored to represent hematoma, presumably obscuring the right ovary. Question rupture of an underlying right ovarian mass. Correlate with beta HCG level, as ruptured ectopic pregnancy could have this appearance. Extension of smaller volume hemorrhage into the mid and lower abdomen. 2. Lingular 9 mm pulmonary nodule, indeterminate. Presuming the patient is not eventually diagnosed with primary malignancy, follow-up guidelines as follows: Consider  one of the following in 3 months for both low-risk and high-risk individuals: (a) repeat chest CT, (b) follow-up PET-CT, or (c) tissue sampling. This recommendation follows the consensus statement: Guidelines for Management of Incidental Pulmonary Nodules Detected on CT Images: From the Fleischner Society 2017; Radiology 2017; 284:228-243. 3. Trace left pleural fluid. 4. Normal appendix. 5. Small uterine fibroid. These results were called by telephone at the time of interpretation on 03/29/2022 at 7:12 pm to provider Dr. RQuentin Cornwall Who verbally acknowledged these results. Electronically Signed   By: KAbigail MiyamotoM.D.   On: 03/29/2022 19:17    Assessment/Plan: Hemoperitoneum - unclear source but suspected to be ovarian source.  Some free fluid noted in the pelvis. Patient with soft signs of hypovolemia, including mild tachycardia and borderline low BPs.  Possible  active slow bleed still ongoing.  Discussed options for management, including surgical evacuation of hemoperitoneum and cautery of any sites of active bleeding, vs serial exams to monitor for active bleeding, and if none found could consider CT guided evacuation in a.m.  Discussed risks and benefits of both methods. Patient and family member had discussion, would prefer to proceed with surgical intervention with laparoscopy. Consent forms signed, patient has been in ER since ~ 0815 this morning, has remained NPO.  Fibroid uterus - small fibroid, incidental finding. No concerns at this time as patient is not symptomatic.    A total of 45 minutes were spent face-to-face with the patient during the encounter with greater than 50% dealing with review of chart (labs, imaging, ER records),  counseling and coordination of surgical care.   Rubie Maid, MD [facility] 03/29/2022

## 2022-03-29 NOTE — ED Provider Triage Note (Signed)
Emergency Medicine Provider Triage Evaluation Note  Lori Beltran, a 46 y.o. female  was evaluated in triage.  Pt complains of right lower quadrant pain with associated nausea/vomiting  and diarrhea, since Tuesday.  Patient reports sudden onset in the early morning hours.  At that time she has had anorexia and persistent emesis.  She has had poor oral intake since onset of symptoms.  Review of Systems  Positive: RLQ abdominal pain, NVD Negative: FCS  Physical Exam  BP 95/79 (BP Location: Left Arm)   Pulse (!) 103   Temp 99.5 F (37.5 C) (Oral)   Resp 20   Ht '5\' 2"'$  (1.575 m)   Wt 69.4 kg   SpO2 97%   BMI 27.98 kg/m  Gen:   Awake, no distress NAD Resp:  Normal effort CTA MSK:   Moves extremities without difficulty  ABD:  Tender to palpation to the lower quadrants  Medical Decision Making  Medically screening exam initiated at 6:06 PM.  Appropriate orders placed.  Phil P Schubring was informed that the remainder of the evaluation will be completed by another provider, this initial triage assessment does not replace that evaluation, and the importance of remaining in the ED until their evaluation is complete.  Patient to the ED for evaluation of right lower quadrant pain with associated N/V/D since Tuesday.   Melvenia Needles, PA-C 03/29/22 1808

## 2022-03-29 NOTE — ED Notes (Signed)
Pt transported to PACU by RN

## 2022-03-29 NOTE — Anesthesia Preprocedure Evaluation (Signed)
Anesthesia Evaluation  Patient identified by MRN, date of birth, ID band Patient awake    Reviewed: Allergy & Precautions, NPO status , Patient's Chart, lab work & pertinent test results  History of Anesthesia Complications Negative for: history of anesthetic complications  Airway Mallampati: IV   Neck ROM: Full    Dental  (+) Missing   Pulmonary sleep apnea and Continuous Positive Airway Pressure Ventilation ,    Pulmonary exam normal breath sounds clear to auscultation       Cardiovascular Exercise Tolerance: Good negative cardio ROS Normal cardiovascular exam Rhythm:Regular Rate:Normal     Neuro/Psych  Headaches,    GI/Hepatic GERD  ,  Endo/Other  negative endocrine ROS  Renal/GU negative Renal ROS     Musculoskeletal   Abdominal   Peds  Hematology negative hematology ROS (+)   Anesthesia Other Findings   Reproductive/Obstetrics                             Anesthesia Physical Anesthesia Plan  ASA: 2 and emergent  Anesthesia Plan: General   Post-op Pain Management:    Induction: Intravenous  PONV Risk Score and Plan: 3 and Ondansetron, Dexamethasone and Treatment may vary due to age or medical condition  Airway Management Planned: Oral ETT  Additional Equipment:   Intra-op Plan:   Post-operative Plan: Extubation in OR  Informed Consent: I have reviewed the patients History and Physical, chart, labs and discussed the procedure including the risks, benefits and alternatives for the proposed anesthesia with the patient or authorized representative who has indicated his/her understanding and acceptance.     Dental advisory given  Plan Discussed with: CRNA  Anesthesia Plan Comments: (Patient consented for risks of anesthesia including but not limited to:  - adverse reactions to medications - damage to eyes, teeth, lips or other oral mucosa - nerve damage due to  positioning  - sore throat or hoarseness - damage to heart, brain, nerves, lungs, other parts of body or loss of life  Informed patient about role of CRNA in peri- and intra-operative care.  Patient voiced understanding.)        Anesthesia Quick Evaluation

## 2022-03-29 NOTE — Anesthesia Procedure Notes (Signed)
Procedure Name: Intubation Date/Time: 03/29/2022 11:16 PM  Performed by: Aline Brochure, CRNAPre-anesthesia Checklist: Patient identified, Patient being monitored, Timeout performed, Emergency Drugs available and Suction available Patient Re-evaluated:Patient Re-evaluated prior to induction Oxygen Delivery Method: Circle system utilized Preoxygenation: Pre-oxygenation with 100% oxygen Induction Type: IV induction Ventilation: Mask ventilation without difficulty Laryngoscope Size: 3 and McGraph Grade View: Grade I Tube type: Oral Tube size: 7.0 mm Number of attempts: 1 Airway Equipment and Method: Stylet and Video-laryngoscopy Placement Confirmation: ETT inserted through vocal cords under direct vision, positive ETCO2 and breath sounds checked- equal and bilateral Secured at: 21 cm Tube secured with: Tape Dental Injury: Teeth and Oropharynx as per pre-operative assessment  Difficulty Due To: Difficult Airway- due to limited oral opening

## 2022-03-29 NOTE — ED Provider Notes (Signed)
[facility] Provider Note    Event Date/Time   First MD Initiated Contact with Patient 03/29/22 1948     (approximate)   History   Abdominal Pain   HPI  Batul P Hun is a 46 y.o. female   with history of abnormal Pap status post endometrial ablation and tubal ligation presents to the ER for evaluation of moderate severe right lower quadrant pain progressively worsening over the past 2 days denies any vaginal bleeding.  No vaginal discharge.  No fevers has had some nausea.  Rates the pain is moderate to severe.  Does not think there is any chance of her being pregnant.  No history of ovarian cyst      Physical Exam   Triage Vital Signs: ED Triage Vitals  Enc Vitals Group     BP 03/29/22 1757 95/79     Pulse Rate 03/29/22 1757 (!) 103     Resp 03/29/22 1757 20     Temp 03/29/22 1757 99.5 F (37.5 C)     Temp Source 03/29/22 1757 Oral     SpO2 03/29/22 1757 97 %     Weight 03/29/22 1758 153 lb (69.4 kg)     Height 03/29/22 1758 '5\' 2"'$  (1.575 m)     Head Circumference --      Peak Flow --      Pain Score 03/29/22 1757 7     Pain Loc --      Pain Edu? --      Excl. in [place]? --     Most recent vital signs: Vitals:   03/29/22 1757  BP: 95/79  Pulse: (!) 103  Resp: 20  Temp: 99.5 F (37.5 C)  SpO2: 97%     Constitutional: Alert  Eyes: Conjunctivae are normal.  Head: Atraumatic. Nose: No congestion/rhinnorhea. Mouth/Throat: Mucous membranes are moist.   Neck: Painless ROM.  Cardiovascular:   Good peripheral circulation. Respiratory: Normal respiratory effort.  No retractions.  Gastrointestinal: Soft with tenderness to palpation and guarding on initial exam of the right lower quadrant. Musculoskeletal:  no deformity Neurologic:  MAE spontaneously. No gross focal neurologic deficits are appreciated.  Skin:  Skin is warm, dry and intact. No rash noted. Psychiatric: Mood and affect are normal. Speech and behavior are normal.    ED  Results / Procedures / Treatments   Labs (all labs ordered are listed, but only abnormal results are displayed) Labs Reviewed  BASIC METABOLIC PANEL - Abnormal; Notable for the following components:      Result Value   Glucose, Bld 104 (*)    All other components within normal limits  CBC WITH DIFFERENTIAL/PLATELET - Abnormal; Notable for the following components:   WBC 13.8 (*)    Hemoglobin 10.9 (*)    HCT 34.3 (*)    Neutro Abs 11.6 (*)    All other components within normal limits  URINALYSIS, ROUTINE W REFLEX MICROSCOPIC - Abnormal; Notable for the following components:   Color, Urine STRAW (*)    APPearance CLEAR (*)    Specific Gravity, Urine >1.046 (*)    Hgb urine dipstick SMALL (*)    Ketones, ur 20 (*)    Bacteria, UA RARE (*)    All other components within normal limits  LIPASE, BLOOD  HCG, QUANTITATIVE, PREGNANCY  TYPE AND SCREEN     EKG     RADIOLOGY Please see ED Course for my review and interpretation.  I personally reviewed all radiographic images ordered to  evaluate for the above acute complaints and reviewed radiology reports and findings.  These findings were personally discussed with the patient.  Please see medical record for radiology report.    PROCEDURES:  Critical Care performed: No  Procedures   MEDICATIONS ORDERED IN ED: Medications  morphine (PF) 4 MG/ML injection 4 mg (4 mg Intravenous Given 03/29/22 2020)  sodium chloride 0.9 % bolus 1,000 mL (1,000 mLs Intravenous New Bag/Given 03/29/22 1814)  ondansetron (ZOFRAN) injection 4 mg (4 mg Intravenous Given 03/29/22 1811)  iohexol (OMNIPAQUE) 300 MG/ML solution 100 mL (100 mLs Intravenous Contrast Given 03/29/22 1837)     IMPRESSION / MDM / ASSESSMENT AND PLAN / ED COURSE  I reviewed the triage vital signs and the nursing notes.                              Differential diagnosis includes, but is not limited to, appendicitis, ovarian cyst, mass, diverticulitis, stone, UTI, pyelo-,  torsion, TOA  Patient presented to the ER for evaluation of acute right lower quadrant abdominal pain as described above.  This presenting complaint could reflect a potentially life-threatening illness therefore the patient will be placed on continuous pulse oximetry and telemetry for monitoring.  Laboratory evaluation will be sent to evaluate for the above complaints.     Clinical Course as of 03/29/22 2141  Thu Mar 29, 2022  1957 CT imaging on my interpretation does show evidence of pelvic fluid.  Per radiology report there is evidence of hematoma. [PR]  2012 Based on CT imaging I have consulted Dr. Marcelline Mates of OB/GYN. [PR]  2104 Dr. Marcelline Mates at bedside evaluating patient. [PR]    Clinical Course User Index [PR] Merlyn Lot, MD    Patient's presentation is most consistent with acute presentation with potential threat to life or bodily function.   FINAL CLINICAL IMPRESSION(S) / ED DIAGNOSES   Final diagnoses:  RLQ abdominal pain  Pelvic hematoma, female     Rx / DC Orders   ED Discharge Orders     None        Note:  This document was prepared using Dragon voice recognition software and may include unintentional dictation errors.    Merlyn Lot, MD 03/29/22 2142

## 2022-03-29 NOTE — ED Triage Notes (Signed)
Pt with c/o RLQ abdominal pain that started Tuesday evening, N/V/D as well. Pt states she cannot keep anything down.

## 2022-03-29 NOTE — Progress Notes (Addendum)
BP 124/68   Pulse 99   Temp 99 F (37.2 C) (Oral)   Resp 16   Ht '5\' 2"'  (1.575 m)   Wt 153 lb (69.4 kg)   SpO2 98%   BMI 27.98 kg/m    Subjective:    Patient ID: Lori Beltran, female    DOB: 1976-08-28, 46 y.o.   MRN: [id number]  HPI: Lori Beltran is a 46 y.o. female  Chief Complaint  Patient presents with   Abdominal Pain    Started RLQ, radiates to upper pelvis area. No diarrhea/vomiting since yesterday.   Right lower quadrant abdominal pain: She reports that she woke up at 3 AM yesterday morning with right lower quadrant abdominal pain 10 out of 10.  Patient states she is having trouble moving around.  It hurts when she goes over bumps.  She has had some vomiting and diarrhea.  But states that she has not had any diarrhea today.  Patient denies any fever or urinary complaints.  Patient is extremely tender in the right lower quadrant and periumbilical area.  Patient describes the pain as sharp and shooting across the right lower abdomen.  We will get stat CT and lab work.  Patient states she is not able to urinate at this time.  Patient had an ablation.  No need for hCG.  Relevant past medical, surgical, family and social history reviewed and updated as indicated. Interim medical history since our last visit reviewed. Allergies and medications reviewed and updated.  Review of Systems  Constitutional: Negative for fever or weight change.  Respiratory: Negative for cough and shortness of breath.   Cardiovascular: Negative for chest pain or palpitations.  Gastrointestinal: Positive for abdominal pain, diarrhea resolved Musculoskeletal: Negative for gait problem or joint swelling.  Skin: Negative for rash.  Neurological: Negative for dizziness or headache.  No other specific complaints in a complete review of systems (except as listed in HPI above).      Objective:    BP 124/68   Pulse 99   Temp 99 F (37.2 C) (Oral)   Resp 16   Ht '5\' 2"'  (1.575 m)   Wt 153  lb (69.4 kg)   SpO2 98%   BMI 27.98 kg/m   Wt Readings from Last 3 Encounters:  03/29/22 153 lb (69.4 kg)  03/08/22 156 lb (70.8 kg)  02/09/22 157 lb (71.2 kg)    Physical Exam  Constitutional: Patient appears well-developed and well-nourished. Obese  No distress.  HEENT: head atraumatic, normocephalic, pupils equal and reactive to light,  neck supple Cardiovascular: Normal rate, regular rhythm and normal heart sounds.  No murmur heard. No BLE edema. Pulmonary/Chest: Effort normal and breath sounds normal. No respiratory distress. Abdominal: Soft.  Right lower quadrant tenderness, and generalized tenderness in the periumbilical area. Psychiatric: Patient has a normal mood and affect. behavior is normal. Judgment and thought content normal.  Results for orders placed or performed in visit on 02/09/22  Lipid panel  Result Value Ref Range   Cholesterol 159 <200 mg/dL   HDL 44 (L) > OR = 50 mg/dL   Triglycerides 102 <150 mg/dL   LDL Cholesterol (Calc) 95 mg/dL (calc)   Total CHOL/HDL Ratio 3.6 <5.0 (calc)   Non-HDL Cholesterol (Calc) 115 <130 mg/dL (calc)  COMPLETE METABOLIC PANEL WITH GFR  Result Value Ref Range   Glucose, Bld 80 65 - 99 mg/dL   BUN 11 7 - 25 mg/dL   Creat 0.75 0.50 - 0.99 mg/dL  eGFR 98 > OR = 60 mL/min/1.73m   BUN/Creatinine Ratio NOT APPLICABLE 6 - 22 (calc)   Sodium 141 135 - 146 mmol/L   Potassium 4.2 3.5 - 5.3 mmol/L   Chloride 107 98 - 110 mmol/L   CO2 28 20 - 32 mmol/L   Calcium 9.2 8.6 - 10.2 mg/dL   Total Protein 6.3 6.1 - 8.1 g/dL   Albumin 4.0 3.6 - 5.1 g/dL   Globulin 2.3 1.9 - 3.7 g/dL (calc)   AG Ratio 1.7 1.0 - 2.5 (calc)   Total Bilirubin 0.6 0.2 - 1.2 mg/dL   Alkaline phosphatase (APISO) 73 31 - 125 U/L   AST 10 10 - 35 U/L   ALT 11 6 - 29 U/L  CBC with Differential/Platelet  Result Value Ref Range   WBC 5.3 3.8 - 10.8 Thousand/uL   RBC 4.28 3.80 - 5.10 Million/uL   Hemoglobin 11.6 (L) 11.7 - 15.5 g/dL   HCT 36.2 35.0 - 45.0 %    MCV 84.6 80.0 - 100.0 fL   MCH 27.1 27.0 - 33.0 pg   MCHC 32.0 32.0 - 36.0 g/dL   RDW 15.3 (H) 11.0 - 15.0 %   Platelets 383 140 - 400 Thousand/uL   MPV 10.1 7.5 - 12.5 fL   Neutro Abs 2,374 1,500 - 7,800 cells/uL   Lymphs Abs 2,258 850 - 3,900 cells/uL   Absolute Monocytes 440 200 - 950 cells/uL   Eosinophils Absolute 138 15 - 500 cells/uL   Basophils Absolute 90 0 - 200 cells/uL   Neutrophils Relative % 44.8 %   Total Lymphocyte 42.6 %   Monocytes Relative 8.3 %   Eosinophils Relative 2.6 %   Basophils Relative 1.7 %  Hemoglobin A1c  Result Value Ref Range   Hgb A1c MFr Bld 5.5 <5.7 % of total Hgb   Mean Plasma Glucose 111 mg/dL   eAG (mmol/L) 6.2 mmol/L      Assessment & Plan:   Problem List Items Addressed This Visit   None Visit Diagnoses     Right lower quadrant abdominal pain    -  Primary   Patient extremely uncomfortable.  Discussed with patient getting CT, lab work.  Rule out appendicitis.   Relevant Orders   CT ABDOMEN PELVIS W WO CONTRAST   CBC with Differential/Platelet   Comprehensive metabolic panel        Follow up plan: Return if symptoms worsen or fail to improve.

## 2022-03-30 ENCOUNTER — Other Ambulatory Visit: Payer: Self-pay

## 2022-03-30 ENCOUNTER — Encounter: Payer: Self-pay | Admitting: Obstetrics and Gynecology

## 2022-03-30 DIAGNOSIS — K661 Hemoperitoneum: Secondary | ICD-10-CM

## 2022-03-30 DIAGNOSIS — N838 Other noninflammatory disorders of ovary, fallopian tube and broad ligament: Secondary | ICD-10-CM | POA: Diagnosis not present

## 2022-03-30 DIAGNOSIS — N83201 Unspecified ovarian cyst, right side: Secondary | ICD-10-CM | POA: Diagnosis not present

## 2022-03-30 MED ORDER — SENNOSIDES-DOCUSATE SODIUM 8.6-50 MG PO TABS
1.0000 | ORAL_TABLET | Freq: Every evening | ORAL | Status: DC | PRN
Start: 2022-03-30 — End: 2022-03-30

## 2022-03-30 MED ORDER — ONDANSETRON HCL 4 MG/2ML IJ SOLN: 4.0000 mg | Freq: Once | INTRAMUSCULAR | Status: DC | PRN

## 2022-03-30 MED ORDER — ACETAMINOPHEN 10 MG/ML IV SOLN
1000.0000 mg | Freq: Once | INTRAVENOUS | Status: DC | PRN
Start: 2022-03-30 — End: 2022-03-30

## 2022-03-30 MED ORDER — BISACODYL 10 MG RE SUPP: 10.0000 mg | Freq: Every day | RECTAL | Status: DC | PRN

## 2022-03-30 MED ORDER — ACETAMINOPHEN 325 MG PO TABS: 650.0000 mg | ORAL_TABLET | ORAL | Status: DC | PRN

## 2022-03-30 MED ORDER — DOCUSATE SODIUM 100 MG PO CAPS
100.0000 mg | ORAL_CAPSULE | Freq: Two times a day (BID) | ORAL | Status: DC
  Administered 2022-03-30: 100 mg via ORAL
  Filled 2022-03-30: qty 1

## 2022-03-30 MED ORDER — PHENYLEPHRINE 80 MCG/ML (10ML) SYRINGE FOR IV PUSH (FOR BLOOD PRESSURE SUPPORT)
PREFILLED_SYRINGE | INTRAVENOUS | Status: AC
  Filled 2022-03-30: qty 10

## 2022-03-30 MED ORDER — OXYCODONE HCL 5 MG PO TABS
5.0000 mg | ORAL_TABLET | Freq: Once | ORAL | Status: AC | PRN
  Administered 2022-03-30: 5 mg via ORAL

## 2022-03-30 MED ORDER — SUCCINYLCHOLINE CHLORIDE 200 MG/10ML IV SOSY
PREFILLED_SYRINGE | INTRAVENOUS | Status: AC
  Filled 2022-03-30: qty 10

## 2022-03-30 MED ORDER — ONDANSETRON HCL 4 MG/2ML IJ SOLN: 4.0000 mg | Freq: Four times a day (QID) | INTRAMUSCULAR | Status: DC | PRN

## 2022-03-30 MED ORDER — GABAPENTIN 100 MG PO CAPS
100.0000 mg | ORAL_CAPSULE | Freq: Three times a day (TID) | ORAL | Status: DC
  Administered 2022-03-30: 100 mg via ORAL
  Filled 2022-03-30: qty 1

## 2022-03-30 MED ORDER — LIDOCAINE HCL (PF) 2 % IJ SOLN
INTRAMUSCULAR | Status: AC
  Filled 2022-03-30: qty 5

## 2022-03-30 MED ORDER — OXYCODONE HCL 5 MG PO TABS
5.0000 mg | ORAL_TABLET | ORAL | 0 refills | Status: DC | PRN
  Filled 2022-03-30: qty 15, 2d supply, fill #0

## 2022-03-30 MED ORDER — HYDROCODONE-ACETAMINOPHEN 5-325 MG PO TABS
1.0000 | ORAL_TABLET | ORAL | Status: DC | PRN
  Administered 2022-03-30: 2 via ORAL
  Filled 2022-03-30: qty 2

## 2022-03-30 MED ORDER — SUGAMMADEX SODIUM 200 MG/2ML IV SOLN
INTRAVENOUS | Status: DC | PRN
  Administered 2022-03-30: 200 mg via INTRAVENOUS

## 2022-03-30 MED ORDER — ALUM & MAG HYDROXIDE-SIMETH 200-200-20 MG/5ML PO SUSP
30.0000 mL | ORAL | Status: DC | PRN
Start: 2022-03-30 — End: 2022-03-30

## 2022-03-30 MED ORDER — ACETAMINOPHEN 10 MG/ML IV SOLN
INTRAVENOUS | Status: DC | PRN
  Administered 2022-03-30: 1000 mg via INTRAVENOUS

## 2022-03-30 MED ORDER — OXYCODONE HCL 5 MG PO TABS
ORAL_TABLET | ORAL | Status: AC
  Filled 2022-03-30: qty 1

## 2022-03-30 MED ORDER — DEXMEDETOMIDINE (PRECEDEX) IN NS 20 MCG/5ML (4 MCG/ML) IV SYRINGE
PREFILLED_SYRINGE | INTRAVENOUS | Status: DC | PRN
  Administered 2022-03-30 (×2): 8 ug via INTRAVENOUS

## 2022-03-30 MED ORDER — SIMETHICONE 80 MG PO CHEW
80.0000 mg | CHEWABLE_TABLET | Freq: Four times a day (QID) | ORAL | Status: DC | PRN
  Administered 2022-03-30: 80 mg via ORAL
  Filled 2022-03-30: qty 1

## 2022-03-30 MED ORDER — DOCUSATE SODIUM 100 MG PO CAPS
100.0000 mg | ORAL_CAPSULE | Freq: Two times a day (BID) | ORAL | 0 refills | Status: DC | PRN
  Filled 2022-03-30: qty 30, 15d supply, fill #0

## 2022-03-30 MED ORDER — ACETAMINOPHEN 10 MG/ML IV SOLN
INTRAVENOUS | Status: AC
  Filled 2022-03-30: qty 100

## 2022-03-30 MED ORDER — PANTOPRAZOLE SODIUM 40 MG PO TBEC
40.0000 mg | DELAYED_RELEASE_TABLET | Freq: Every day | ORAL | Status: DC
Start: 2022-03-30 — End: 2022-03-30
  Administered 2022-03-30: 40 mg via ORAL
  Filled 2022-03-30: qty 1

## 2022-03-30 MED ORDER — LACTATED RINGERS IR SOLN
Status: DC | PRN
  Administered 2022-03-30 (×2): 1000 mL

## 2022-03-30 MED ORDER — MENTHOL 3 MG MT LOZG: 1.0000 | LOZENGE | OROMUCOSAL | Status: DC | PRN

## 2022-03-30 MED ORDER — ROCURONIUM BROMIDE 10 MG/ML (PF) SYRINGE
PREFILLED_SYRINGE | INTRAVENOUS | Status: AC
  Filled 2022-03-30: qty 10

## 2022-03-30 MED ORDER — DEXAMETHASONE SODIUM PHOSPHATE 10 MG/ML IJ SOLN
INTRAMUSCULAR | Status: AC
  Filled 2022-03-30: qty 1

## 2022-03-30 MED ORDER — OXYCODONE HCL 5 MG/5ML PO SOLN: 5.0000 mg | Freq: Once | ORAL | Status: AC | PRN

## 2022-03-30 MED ORDER — ONDANSETRON HCL 4 MG/2ML IJ SOLN
INTRAMUSCULAR | Status: AC
  Filled 2022-03-30: qty 2

## 2022-03-30 MED ORDER — LACTATED RINGERS IV SOLN: INTRAVENOUS | Status: DC

## 2022-03-30 MED ORDER — IBUPROFEN 600 MG PO TABS
600.0000 mg | ORAL_TABLET | Freq: Four times a day (QID) | ORAL | Status: DC
  Administered 2022-03-30: 600 mg via ORAL
  Filled 2022-03-30: qty 1

## 2022-03-30 MED ORDER — BUPIVACAINE HCL 0.5 % IJ SOLN
INTRAMUSCULAR | Status: DC | PRN
  Administered 2022-03-30: 20 mL

## 2022-03-30 MED ORDER — FENTANYL CITRATE (PF) 100 MCG/2ML IJ SOLN
25.0000 ug | INTRAMUSCULAR | Status: DC | PRN
  Administered 2022-03-30: 50 ug via INTRAVENOUS

## 2022-03-30 MED ORDER — ACETAMINOPHEN 500 MG PO TABS
1000.0000 mg | ORAL_TABLET | Freq: Four times a day (QID) | ORAL | 0 refills | Status: DC | PRN
  Filled 2022-03-30: qty 60, 8d supply, fill #0

## 2022-03-30 MED ORDER — FENTANYL CITRATE (PF) 100 MCG/2ML IJ SOLN
INTRAMUSCULAR | Status: AC
  Filled 2022-03-30: qty 2

## 2022-03-30 MED ORDER — SIMETHICONE 80 MG PO CHEW
80.0000 mg | CHEWABLE_TABLET | Freq: Four times a day (QID) | ORAL | 0 refills | Status: DC | PRN
  Filled 2022-03-30: qty 60, 15d supply, fill #0

## 2022-03-30 MED ORDER — LACTATED RINGERS IV SOLN
INTRAVENOUS | Status: DC
Start: 2022-03-30 — End: 2022-03-30

## 2022-03-30 MED ORDER — ONDANSETRON HCL 4 MG PO TABS: 4.0000 mg | ORAL_TABLET | Freq: Four times a day (QID) | ORAL | Status: DC | PRN

## 2022-03-30 MED ORDER — ZOLPIDEM TARTRATE 5 MG PO TABS: 5.0000 mg | ORAL_TABLET | Freq: Every evening | ORAL | Status: DC | PRN

## 2022-03-30 NOTE — Transfer of Care (Signed)
Immediate Anesthesia Transfer of Care Note  Patient: Lori Beltran  Procedure(s) Performed: LAPAROSCOPIC RIGHT SALPINGO-OOPHORECTOMY EVACUATION OF HEMOPERITONEUM  (Right)  Patient Location: PACU  Anesthesia Type:General  Level of Consciousness: awake  Airway & Oxygen Therapy: Patient Spontanous Breathing  Post-op Assessment: Report given to RN and Post -op Vital signs reviewed and stable  Post vital signs: Reviewed and stable  Last Vitals:  Vitals Value Taken Time  BP    Temp    Pulse 91 03/30/22 0112  Resp 16 03/30/22 0112  SpO2 96 % 03/30/22 0112  Vitals shown include unvalidated device data.  Last Pain:  Vitals:   03/30/22 0107  TempSrc:   PainSc: Asleep         Complications: No notable events documented.

## 2022-03-30 NOTE — Progress Notes (Signed)
Pt discharged home. Discharge instructions, prescriptions, and follow up appointments given to and reviewed with pt. Pt verbalized understanding. Escorted by axillary.

## 2022-03-30 NOTE — Op Note (Signed)
Procedure(s): LAPAROSCOPIC RIGHT SALPINGO-OOPHORECTOMY EVACUATION OF HEMOPERITONEUM  Procedure Note  Lori Beltran female 46 y.o. 03/30/2022  Indications: The patient is a 46 y.o. G38P3003 female with hemoperitoneum, suspected ovarian source, RLQ pain, fibroid uterus  Pre-operative Diagnosis: hemoperitoneum, suspected ovarian source, RLQ pain, fibroid uterus  Post-operative Diagnosis: Same, with ruptured right ovary cyst  Surgeon: Rubie Maid, MD  Assistants:  Jeannie Fend, MD   Anesthesia: General endotracheal anesthesia  Findings: The uterus was sounded to 8 cm Fallopian tubes previously surgically interrupted, appeared normal.  Left ovary appeared normal. Right ovary enlarged, 8-10 cm, with rupture. Moderate hemoperitoneum  Procedure Details: The patient was seen in the Holding Room. The risks, benefits, complications, treatment options, and expected outcomes were discussed with the patient.  The patient concurred with the proposed plan, giving informed consent.  The site of surgery properly noted/marked. The patient was taken to the Operating Room, identified as Lori Beltran and the procedure verified as Procedure(s) (LRB): LAPAROSCOPIC DIAGNOSTIC;  EVACUATION OF HEMOPERITONEUM.   She was then placed under general anesthesia without difficulty. She was placed in the dorsal lithotomy position, and was prepped and draped in a sterile manner.  A Time Out was held and the above information confirmed.A straight catheterization was performed. A sterile speculum was inserted into the vagina and the cervix was grasped at the anterior lip using a single-toothed tenaculum.  The uterus was sounded to 8 cm, and a Hulka clamp was placed for uterine manipulation.  The speculum and tenaculum were then removed. After an adequate timeout was performed, attention was turned to the abdomen where an umbilical incision was made with the scalpel.  The Optiview 5-mm trocar and sleeve were then  advanced without difficulty with the laparoscope under direct visualization into the abdomen.  The abdomen was then insufflated with carbon dioxide gas and adequate pneumoperitoneum was obtained. A 5-mm right lower quadrant port and an 11-mm left lower quadrant port were then placed under direct visualization.  A survey of the patient's pelvis and abdomen revealed the findings as above.  On the right side, the ovary appeared to be enlarged with somewhat edematous capsule that was cystic in nature, with large rupture.  The decision was made that due to the extensive damage of the right ovary from the rupture, to proceed with removal of adnexal structures on the right.  The infundibulopelvic ligament was clamped and transected with the Harmonic device.  An Endocatch bag was then inserted into the 11 mm trochar and the left ovary and tube were removed.  The 11 mm trochar was then removed, and a second larger Endocatch bag was inserted to remove the right adnexa.  The skin and fascial incision were extended to be able to remove the specimen. The fascia of the incision was then closed using the PMI suture closure device under direct visualization using 0-Vicryl.  The subcutaneous fat layer was approximated using 3-0 Vicryl in a running fashion.   The pneumoperitoneum was then re-established, and the laparoscope was then introduced once again into the abdominal cavity.  A final survey was performed, where good hemostasis was noted throughout.  All trocars were removed under direct visualization, and the abdomen which was desufflated.    All skin incisions were closed with 4-0 Vicryl subcuticular stitches. The patient tolerated the procedures well.  All instruments, needles, and sponge counts were correct x 2. The patient was taken to the recovery room awake, extubated and in stable condition.   An experienced assistant was  required given the standard of surgical care given the complexity of the case.  This assistant  was needed for exposure, dissection, suctioning, retraction, instrument exchange, and for overall help during the procedure.   Estimated Blood Loss:  425 ml hemoperitoneum; 10 ml surgical blood loss      Drains: straight catheterization prior to procedure with 100 ml of clear urine         Total IV Fluids: 1200 ml  Specimens: Right ovary and fallopian tube         Implants: None         Complications:  None; patient tolerated the procedure well.         Disposition: PACU - hemodynamically stable.         Condition: stable   Rubie Maid, MD [facility]

## 2022-03-30 NOTE — Anesthesia Postprocedure Evaluation (Signed)
Anesthesia Post Note  Patient: Lori Beltran  Procedure(s) Performed: LAPAROSCOPIC RIGHT SALPINGO-OOPHORECTOMY EVACUATION OF HEMOPERITONEUM  (Right)  Patient location during evaluation: PACU Anesthesia Type: General Level of consciousness: awake and alert, oriented and patient cooperative Pain management: pain level controlled Vital Signs Assessment: post-procedure vital signs reviewed and stable Respiratory status: spontaneous breathing, nonlabored ventilation and respiratory function stable Cardiovascular status: blood pressure returned to baseline and stable Postop Assessment: adequate PO intake Anesthetic complications: no   No notable events documented.   Last Vitals:  Vitals:   03/30/22 0216 03/30/22 0251  BP: 97/67 96/67  Pulse: 86 83  Resp: 18 18  Temp: 36.8 C 36.6 C  SpO2: 97% 97%    Last Pain:  Vitals:   03/30/22 0215  TempSrc:   PainSc: [place]

## 2022-03-30 NOTE — Progress Notes (Signed)
Post-Operative Day # 0, s/p laparoscopic RSO, evacuation of hemoperitoneum.   Subjective: no complaints and tolerating PO.  Has ambulated to the restroom without difficulty..  Notes pain is controlled.  Objective: Temp:  [97.1 F (36.2 C)-99.5 F (37.5 C)] 97.8 F (36.6 C) (06/09 0819) Pulse Rate:  [81-103] 83 (06/09 0819) Resp:  [10-20] 16 (06/09 0819) BP: (88-124)/(63-79) 88/63 (06/09 0819) SpO2:  [92 %-100 %] 96 % (06/09 0819) Weight:  [69.4 kg] 69.4 kg (06/08 1758)  Physical Exam:  General: alert and no distress  Lungs: clear to auscultation bilaterally Heart: regular rate and rhythm, S1, S2 normal, no murmur, click, rub or gallop Abdomen: soft, non-tender; bowel sounds normal; no masses,  no organomegaly Pelvis:Bleeding: Minimal,  Incision: healing well, no significant drainage, no dehiscence, no significant erythema Extremities: DVT Evaluation: No evidence of DVT seen on physical exam. Negative Homan's sign. No cords or calf tenderness. No significant calf/ankle edema.   Recent Labs    03/29/22 1802  HGB 10.9*  HCT 34.3*     Lab Results  Component Value Date   CREATININE 0.84 03/29/2022     Assessment/Plan: Doing well postoperatively. Discussed intraoperative findings with patient. Discussed with incision care with patient. Regular diet Continue p.o. pain management Discharge home       Rubie Maid, MD [facility]

## 2022-03-30 NOTE — Discharge Summary (Signed)
Gynecology Physician Postoperative Discharge Summary  Patient ID: Lori Beltran MRN: [id number] DOB/AGE: 1976-07-03 46 y.o.  Admit Date: 03/29/2022 Discharge Date: 03/30/2022  Preoperative Diagnoses: Hemoperitoneum, suspected pelvic hematoma, RLQ pain  Procedures: Procedure(s) (LRB): LAPAROSCOPIC RIGHT SALPINGO-OOPHORECTOMY EVACUATION OF HEMOPERITONEUM  (Right)  Hospital Course:  Lori Beltran is a 46 y.o. G3P3003  admitted from the Emergency Room due to complaints of RLQ pain, pelvic hematoma and hemoperitoneum.  She underwent the procedures as mentioned above due to ruptured right ovary (suspected previous cyst formation). Her operation was uncomplicated. For further details about surgery, please refer to the operative report. Patient had an uncomplicated postoperative course. By time of discharge on POD#0, her pain was controlled on oral pain medications; she was ambulating, voiding without difficulty, tolerating regular diet and passing flatus. She was deemed stable for discharge to home.   Significant Labs:    Latest Ref Rng & Units 03/29/2022    6:02 PM 02/09/2022    8:10 AM 07/08/2020    8:06 AM  CBC  WBC 4.0 - 10.5 K/uL 13.8  5.3  6.5   Hemoglobin 12.0 - 15.0 g/dL 10.9  11.6  12.8   Hematocrit 36.0 - 46.0 % 34.3  36.2  40.0   Platelets 150 - 400 K/uL 383  383  416     Discharge Exam: Blood pressure (!) 88/63, pulse 83, temperature 97.8 F (36.6 C), temperature source Oral, resp. rate 16, height '5\' 2"'$  (1.575 m), weight 153 lb (69.4 kg), SpO2 96 %. General appearance: alert and no distress  Resp: clear to auscultation bilaterally  Cardio: regular rate and rhythm  GI: soft, non-tender; bowel sounds normal; no masses, no organomegaly.  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   cetirizine 10 MG tablet Commonly known as: ZYRTEC Take 10 mg by mouth daily as needed.   docusate sodium 100 MG capsule Commonly known as: COLACE Take 1 capsule (100 mg total) by mouth 2 (two) times daily as needed for mild constipation.   EPINEPHrine 0.3 mg/0.3 mL Soaj injection Commonly known as: EPI-PEN Inject 0.3 mg into the muscle as needed for anaphylaxis.   fluticasone 50 MCG/ACT nasal spray Commonly known as: FLONASE Place 2 sprays into both nostrils daily.   meloxicam 7.5 MG tablet Commonly known as: Mobic Take 1 tablet by mouth once daily (Take 1 tablet every day by oral route.)   Multivital tablet Take 1 tablet by mouth daily.   omeprazole 40 MG capsule Commonly known as: PRILOSEC TAKE 1 CAPSULE (40 MG TOTAL) BY MOUTH DAILY. What changed:  when to take this reasons to take this   oxyCODONE 5 MG immediate release tablet Commonly known as: Roxicodone Take 1 tablet (5 mg total) by mouth every 4 (four) hours as needed for moderate pain or severe pain. May take  up to 2 tablets every 6 hours for severe pain. (Take 1 tablet (5 mg total) by mouth every 4 (four) hours as needed for moderate pain or severe pain (may take up to 2 tabs every 6 hrs for severe pain).)   simethicone 80 MG chewable tablet Commonly known as: Gas-X Chew 1 tablet (80 mg total) by mouth 4 (four) times daily as needed for flatulence.   Trulance 3 MG Tabs Generic drug: Plecanatide Take 1 tablet by mouth daily. What changed:  when to take this reasons to take this   valACYclovir  500 MG tablet Commonly known as: VALTREX TAKE 1 TABLET BY MOUTH TWICE DAILY FOR 3 DAYS AS NEEDED FOR SYMPTOMS   Vitamin D 50 MCG (2000 UT) Caps Take 1 capsule by mouth daily.   Wegovy 1.7 MG/0.75ML Soaj Generic drug: Semaglutide-Weight Management Inject 1.7 mg into the skin once a week.       No future appointments.  Follow-up Information     Rubie Maid, MD Follow up in 1 week(s).   Specialties: Obstetrics and Gynecology, Radiology Why: post-op check Contact information: [place] [address] [phone]                 Total discharge time: 15 minutes   Signed:  Rubie Maid, MD [facility]

## 2022-04-05 LAB — SURGICAL PATHOLOGY

## 2022-04-18 ENCOUNTER — Other Ambulatory Visit: Payer: Self-pay

## 2022-04-18 ENCOUNTER — Ambulatory Visit (INDEPENDENT_AMBULATORY_CARE_PROVIDER_SITE_OTHER): Payer: No Typology Code available for payment source | Admitting: Obstetrics and Gynecology

## 2022-04-18 ENCOUNTER — Encounter: Payer: Self-pay | Admitting: Obstetrics and Gynecology

## 2022-04-18 VITALS — BP 103/74 | HR 77 | Ht 62.0 in | Wt 147.0 lb

## 2022-04-18 DIAGNOSIS — D3911 Neoplasm of uncertain behavior of right ovary: Secondary | ICD-10-CM

## 2022-04-18 DIAGNOSIS — Z09 Encounter for follow-up examination after completed treatment for conditions other than malignant neoplasm: Secondary | ICD-10-CM

## 2022-04-18 DIAGNOSIS — N83202 Unspecified ovarian cyst, left side: Secondary | ICD-10-CM

## 2022-04-18 DIAGNOSIS — K5909 Other constipation: Secondary | ICD-10-CM

## 2022-04-18 MED ORDER — POLYETHYLENE GLYCOL 3350 17 G PO PACK
17.0000 g | PACK | Freq: Every day | ORAL | 0 refills | Status: DC
  Filled 2022-04-18: qty 14, 14d supply, fill #0

## 2022-04-18 NOTE — Progress Notes (Signed)
    OBSTETRICS/GYNECOLOGY POST-OPERATIVE CLINIC VISIT  Subjective:     Lori Beltran is a 46 y.o. female who presents to the clinic 3 weeks status post LAPAROSCOPIC RIGHT SALPINGO-OOPHORECTOMY,  EVACUATION OF HEMOPERITONEUM for RLQ pain and  hemoperitoneum. Eating a regular diet. Bowel movements are not normal, hasn't had a bowel movement this week.  Notes that the Colace has not been helping, now taking Smooth Move Tea which initially helped but now has not had a BM in a week. She has a h/o chronic constipation, typically uses Linzess but notes it makes her have watery stools.  Pain is controlled with current analgesics. Medications being used: acetaminophen.  The following portions of the patient's history were reviewed and updated as appropriate: allergies, current medications, past family history, past medical history, past social history, past surgical history, and problem list.  Review of Systems Pertinent items noted in HPI and remainder of comprehensive ROS otherwise negative.   Objective:   BP 103/74   Pulse 77   Ht '5\' 2"'$  (1.575 m)   Wt 147 lb (66.7 kg)   BMI 26.89 kg/m  There is no height or weight on file to calculate BMI.  General:  alert and no distress  Abdomen: soft, bowel sounds active, non-tender  Incision:   healing well, no drainage, no erythema, no hernia, no seroma, no swelling, no dehiscence, incision well approximated    Pathology:  DIAGNOSIS:  A. FALLOPIAN TUBE AND OVARY, RIGHT; SALPINGO-OOPHORECTOMY:  - OVARY:       - GRANULOSA CELL TUMOR, ADULT TYPE, DISRUPTED.       - BACKGROUND OVARIAN PARENCHYMA WITH PHYSIOLOGIC CHANGES AND  STROMAL HEMORRHAGE, SUGGESTIVE OF EARLY TORSION.       - SEE CANCER SUMMARY.  - FALLOPIAN TUBE:       - BENIGN PARATUBAL CYST.       - OTHERWISE NO SIGNIFICANT HISTOPATHOLOGIC CHANGE.   FIGO STAGE (2018 FIGO Cancer Report)  FIGO Stage: IC2    Assessment:   - Patient s/p LAPAROSCOPIC RSO, EVACUATION OF HEMOPERITONEUM    - Granulosa cell tumor - Chronic constipation   Plan:   1. Continue any current medications as instructed by provider. Will prescribe Miralax for constipation.  2. Wound care discussed. 3. Operative findings again reviewed. Pathology report discussed. Advised that patient will need to be seen by GYN Oncology for further recommendations for management of granulosa cell tumor.  4. Activity restrictions: no bending, stooping, or squatting and no lifting more than 10-15 pounds until the end of this week.   5. Anticipated return to work: now. 6. Follow up: with GYN Oncology, referral placed.     Lori Maid, MD [facility]

## 2022-04-19 ENCOUNTER — Encounter: Payer: No Typology Code available for payment source | Admitting: Obstetrics and Gynecology

## 2022-04-19 ENCOUNTER — Other Ambulatory Visit: Payer: Self-pay

## 2022-05-02 ENCOUNTER — Inpatient Hospital Stay: Payer: No Typology Code available for payment source

## 2022-05-02 ENCOUNTER — Inpatient Hospital Stay
Payer: No Typology Code available for payment source | Attending: Obstetrics and Gynecology | Admitting: Obstetrics and Gynecology

## 2022-05-02 ENCOUNTER — Encounter: Payer: Self-pay | Admitting: Obstetrics and Gynecology

## 2022-05-02 VITALS — BP 106/81 | HR 76 | Resp 18 | Ht 62.0 in | Wt 148.0 lb

## 2022-05-02 DIAGNOSIS — E559 Vitamin D deficiency, unspecified: Secondary | ICD-10-CM | POA: Diagnosis not present

## 2022-05-02 DIAGNOSIS — D3911 Neoplasm of uncertain behavior of right ovary: Secondary | ICD-10-CM

## 2022-05-02 DIAGNOSIS — D259 Leiomyoma of uterus, unspecified: Secondary | ICD-10-CM | POA: Insufficient documentation

## 2022-05-02 DIAGNOSIS — M722 Plantar fascial fibromatosis: Secondary | ICD-10-CM | POA: Insufficient documentation

## 2022-05-02 DIAGNOSIS — Z90721 Acquired absence of ovaries, unilateral: Secondary | ICD-10-CM | POA: Diagnosis not present

## 2022-05-02 DIAGNOSIS — Z8042 Family history of malignant neoplasm of prostate: Secondary | ICD-10-CM | POA: Insufficient documentation

## 2022-05-02 DIAGNOSIS — M1990 Unspecified osteoarthritis, unspecified site: Secondary | ICD-10-CM | POA: Insufficient documentation

## 2022-05-02 DIAGNOSIS — Z800 Family history of malignant neoplasm of digestive organs: Secondary | ICD-10-CM | POA: Diagnosis not present

## 2022-05-02 DIAGNOSIS — Z801 Family history of malignant neoplasm of trachea, bronchus and lung: Secondary | ICD-10-CM | POA: Insufficient documentation

## 2022-05-02 DIAGNOSIS — R87620 Atypical squamous cells of undetermined significance on cytologic smear of vagina (ASC-US): Secondary | ICD-10-CM | POA: Insufficient documentation

## 2022-05-02 DIAGNOSIS — Z87410 Personal history of cervical dysplasia: Secondary | ICD-10-CM | POA: Diagnosis not present

## 2022-05-02 DIAGNOSIS — Z791 Long term (current) use of non-steroidal anti-inflammatories (NSAID): Secondary | ICD-10-CM | POA: Diagnosis not present

## 2022-05-02 DIAGNOSIS — B009 Herpesviral infection, unspecified: Secondary | ICD-10-CM | POA: Insufficient documentation

## 2022-05-02 DIAGNOSIS — K219 Gastro-esophageal reflux disease without esophagitis: Secondary | ICD-10-CM | POA: Diagnosis not present

## 2022-05-02 DIAGNOSIS — Z79899 Other long term (current) drug therapy: Secondary | ICD-10-CM | POA: Insufficient documentation

## 2022-05-02 DIAGNOSIS — Z803 Family history of malignant neoplasm of breast: Secondary | ICD-10-CM | POA: Diagnosis not present

## 2022-05-02 DIAGNOSIS — Z808 Family history of malignant neoplasm of other organs or systems: Secondary | ICD-10-CM | POA: Diagnosis not present

## 2022-05-02 DIAGNOSIS — Z79624 Long term (current) use of inhibitors of nucleotide synthesis: Secondary | ICD-10-CM | POA: Diagnosis not present

## 2022-05-02 DIAGNOSIS — K5909 Other constipation: Secondary | ICD-10-CM | POA: Insufficient documentation

## 2022-05-02 NOTE — Progress Notes (Signed)
Tumor Board Documentation  Lori Beltran was presented by Lori Clonts, RN at our Tumor Board on 05/02/2022, which included representatives from medical oncology, radiation oncology, surgical oncology, pathology, radiology, navigation, genetics, palliative care.  Lori Beltran currently presents as a new patient, for new positive pathology (Granulosa Cell tumor) with history of the following treatments: surgical intervention(s).  Additionally, we reviewed previous medical and familial history, history of present illness, and recent lab results along with all available histopathologic and imaging studies. The tumor board considered available treatment options and made the following recommendations:   We discussed that there is a risk of recurrence, increased with rupture. NCCN Guideliens do recommend adjuvant chemotherapy- carbo-taxol with rupture. Also, question of completion surgery, removing uterus, left tube and ovary. This was discussed today. Consensus was to discuss with [facility] gyn-onc colleagues- completion surgery + adjuvant chemo vs completion surgery  and if negative pathology, observation vs observation. Dr Fransisca Connors will report back.  The following procedures/referrals were also placed: No orders of the defined types were placed in this encounter.   Clinical Trial Status: not discussed   Staging used:    National site-specific guidelines   were discussed with respect to the case.  Tumor board is a meeting of clinicians from various specialty areas who evaluate and discuss patients for whom a multidisciplinary approach is being considered. Final determinations in the plan of care are those of the provider(s). The responsibility for follow up of recommendations given during tumor board is that of the provider.   Today's extended care, comprehensive team conference, Lori Beltran was not present for the discussion and was not examined.

## 2022-05-02 NOTE — Progress Notes (Signed)
Gynecologic Oncology Consult Visit   Referring Provider: Dr. Marcelline Mates   Chief Complaint: granulosa cell tumor  Subjective:  Lori Beltran is a 46 y.o. female who is seen in consultation from Dr. Marcelline Mates for new diagnosis of granulosa cell tumor.   Patient presented to ER for RLQ abdominal pain. Was found to have hemoperitoneum, suspected ovarian source, RLQ pain, fibroid uterus. She was taken to OR by Dr. Marcelline Mates and Dr. Amalia Hailey on 03/30/22. Fallopian tubes previously surgically interrupted, appeared normal.  Left ovary appeared normal. Right ovary enlarged, 8-10 cm, with rupture. Moderate hemoperitoneum. Left ovary appeared normal but was removed along with tube. Was not sent for frozen pathology.   DIAGNOSIS:  A. FALLOPIAN TUBE AND OVARY, RIGHT; SALPINGO-OOPHORECTOMY:  - OVARY:       - GRANULOSA CELL TUMOR, ADULT TYPE, DISRUPTED.       - BACKGROUND OVARIAN PARENCHYMA WITH PHYSIOLOGIC CHANGES AND  STROMAL HEMORRHAGE, SUGGESTIVE OF EARLY TORSION.       - SEE CANCER SUMMARY.  - FALLOPIAN TUBE:       - BENIGN PARATUBAL CYST.       - OTHERWISE NO SIGNIFICANT HISTOPATHOLOGIC CHANGE.   pTNM CLASSIFICATION (AJCC 8th Edition):  pT1c2  FIGO STAGE: 1C2  She presents to clinic for evaluation and management.   CT scan A/P 03/29/22 FINDINGS: Lower chest: Lingular well-circumscribed 9 mm nodule on 01/06. Trace left pleural fluid. Normal heart size without pericardial or pleural effusion. Tiny hiatal hernia.   Hepatobiliary: Normal liver. Normal gallbladder, without biliary ductal dilatation.   Pancreas: Normal, without mass or ductal dilatation.   Spleen: Normal in size, without focal abnormality.   Adrenals/Urinary Tract: Normal adrenal glands. Normal kidneys, without hydronephrosis. Normal urinary bladder.   Stomach/Bowel: Normal stomach, without wall thickening. Normal colon, appendix, and terminal ileum. Normal small bowel.   Vascular/Lymphatic: Aortic atherosclerosis. Prominent  gonadal veins, greater right than left. No abdominopelvic adenopathy.   Reproductive: Small anterior uterine body fibroid including at 1.3 cm on 71/2.   Right and posterior/central pelvic heterogeneous 9.2 x 9.7 cm "mass" is favored to represent primarily hematoma. This presumably obscures the right ovary, which may be positioned superiorly and anteriorly on 61/2. Contiguous small volume hemorrhage extends into the paracollic gutters including in the left upper quadrant on 24/2.   Other: No free intraperitoneal air.   Musculoskeletal: No acute osseous abnormality. Mild convex left lumbar spine curvature.   IMPRESSION: 1. Heterogeneous right posterior pelvic mass, favored to represent hematoma, presumably obscuring the right ovary. Question rupture of an underlying right ovarian mass. Correlate with beta HCG level, as ruptured ectopic pregnancy could have this appearance. Extension of smaller volume hemorrhage into the mid and lower abdomen. 2. Lingular 9 mm pulmonary nodule, indeterminate. Presuming the patient is not eventually diagnosed with primary malignancy, follow-up guidelines as follows: Consider one of the following in 3 months for both low-risk and high-risk individuals: (a) repeat chest CT, (b) follow-up PET-CT, or (c) tissue sampling. This recommendation follows the consensus statement: Guidelines for Management of Incidental Pulmonary Nodules Detected on CT Images: From the Fleischner Society 2017; Radiology 2017; 284:228-243. 3. Trace left pleural fluid. 4. Normal appendix. 5. Small uterine fibroid.    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/2015   Chronic constipation 07/15/2015   History of iron deficiency anemia 07/15/2015   Migraine without aura and without status migrainosus, not intractable 05/04/2015   Environmental and seasonal allergies 05/04/2015   Plantar fasciitis of left foot 05/04/2015   Vitamin D deficiency 05/04/2015   GERD (gastroesophageal reflux disease) 05/04/2015    Past Medical History: Past Medical History:  Diagnosis Date   Arthritis    left knee   Cervical dysplasia    CINII   Constipation    GERD (gastroesophageal reflux disease)    Herpes    Migraine    Obesity    Pap smear abnormality of vagina with ASC-US    Plantar fasciitis    Pre-diabetes    Sleep apnea    Snoring    Vitamin D deficiency     Past Surgical History: Past Surgical History:  Procedure Laterality Date   COLONOSCOPY WITH PROPOFOL N/A 12/30/2020   Procedure: COLONOSCOPY WITH PROPOFOL;  Surgeon: Jonathon Bellows, MD;  Location: [facility] ENDOSCOPY;  Service: Gastroenterology;  Laterality: N/A;   COLPOSCOPY     ENDOMETRIAL ABLATION  2011   KNEE ARTHROSCOPY Left 11/23/2021   Procedure: ARTHROSCOPY KNEE, CHONDROPLASTY OF PATELLORFEMORAL JOINT, REMOVAL OF LOOSE BODIES;  Surgeon: Thornton Park, MD;  Location: [facility] ORS;  Service: Orthopedics;  Laterality: Left;   LAPAROSCOPY Right 03/29/2022   Procedure: LAPAROSCOPIC RIGHT SALPINGO-OOPHORECTOMY EVACUATION OF HEMOPERITONEUM ;  Surgeon: Rubie Maid, MD;  Location: [facility] ORS;  Service: Gynecology;  Laterality: Right;   LEEP     TUBAL LIGATION  2000    Past Gynecologic History:  Menarche: age 57 Sexually Active.  Pain with intercourse.  Hx of herpes.  Surgical contraception Denies history of abnormal pap  OB History:  OB History  Gravida Para Term Preterm AB Living  '3 3 3     3  '$ SAB IAB Ectopic Multiple Live Births          3    # Outcome Date GA Lbr Len/2nd Weight Sex  Delivery Anes PTL Lv  3 Term           2 Term           1 Term             Family History: Family History  Problem Relation Age of Onset   Sleep apnea Mother    Sleep apnea Father    Allergic rhinitis Daughter    Allergic rhinitis Son    Breast cancer Paternal Grandmother 55   Brain cancer Paternal Grandmother    Breast cancer Maternal Aunt 80   Colon cancer Maternal Uncle    Lung cancer Maternal Uncle    Prostate cancer Maternal Uncle    Bone cancer Maternal Aunt     Social History: Social History   Socioeconomic History   Marital status: Married    Spouse name: Gerald Stabs    Number of children: 3   Years of education: Not on file   Highest education level: Associate degree: occupational, Hotel manager, or vocational program  Occupational History   Not on file  Tobacco Use   Smoking status: Never   Smokeless tobacco: Never  Vaping Use   Vaping Use: Never used  Substance and Sexual Activity   Alcohol use: No    Alcohol/week: 0.0 standard drinks of alcohol  Drug use: No   Sexual activity: Yes    Partners: Male    Birth control/protection: Other-see comments, Surgical    Comment: TL  Other Topics Concern   Not on file  Social History Narrative   Not on file   Social Determinants of Health   Financial Resource Strain: Low Risk  (02/09/2022)   Overall Financial Resource Strain (CARDIA)    Difficulty of Paying Living Expenses: Not hard at all  Food Insecurity: No Food Insecurity (02/09/2022)   Hunger Vital Sign    Worried About Running Out of Food in the Last Year: Never true    Ran Out of Food in the Last Year: Never true  Transportation Needs: No Transportation Needs (02/09/2022)   PRAPARE - Hydrologist (Medical): No    Lack of Transportation (Non-Medical): No  Physical Activity: Sufficiently Active (02/09/2022)   Exercise Vital Sign    Days of Exercise per Week: 5 days    Minutes of Exercise per Session: 30 min  Stress: No Stress  Concern Present (02/09/2022)   [place]    Feeling of Stress : Not at all  Social Connections: Moderately Integrated (02/09/2022)   Social Connection and Isolation Panel [NHANES]    Frequency of Communication with Friends and Family: More than three times a week    Frequency of Social Gatherings with Friends and Family: More than three times a week    Attends Religious Services: More than 4 times per year    Active Member of [employer] or Organizations: No    Attends Archivist Meetings: Never    Marital Status: Married  Human resources officer Violence: Not At Risk (02/09/2022)   Humiliation, Afraid, Rape, and Kick questionnaire    Fear of Current or Ex-Partner: No    Emotionally Abused: No    Physically Abused: No    Sexually Abused: No    Allergies: Allergies  Allergen Reactions   Peanut Butter Flavor Itching and Swelling   Shellfish Allergy Itching and Swelling   Sodium Hyaluronate (Avian)     Patient reported avian allergy    Current Medications: Current Outpatient Medications  Medication Sig Dispense Refill   acetaminophen (TYLENOL) 500 MG tablet Take 2 tablets (1,000 mg total) by mouth every 6 (six) hours as needed. 60 tablet 0   cetirizine (ZYRTEC) 10 MG tablet Take 10 mg by mouth daily as needed.     Cholecalciferol (VITAMIN D) 50 MCG (2000 UT) CAPS Take 1 capsule by mouth daily.     fluticasone (FLONASE) 50 MCG/ACT nasal spray Place 2 sprays into both nostrils daily. 16 g 0   Multiple Vitamins-Minerals (MULTIVITAL) tablet Take 1 tablet by mouth daily.     omeprazole (PRILOSEC) 40 MG capsule TAKE 1 CAPSULE (40 MG TOTAL) BY MOUTH DAILY. (Patient taking differently: Take 40 mg by mouth daily as needed.) 30 capsule 5   polyethylene glycol (MIRALAX / GLYCOLAX) 17 g packet Take 17 g by mouth daily. 14 each 0   Semaglutide-Weight Management (WEGOVY) 1.7 MG/0.75ML SOAJ Inject 1.7 mg into the skin once a week. 3 mL  2   valACYclovir (VALTREX) 500 MG tablet TAKE 1 TABLET BY MOUTH TWICE DAILY FOR 3 DAYS AS NEEDED FOR SYMPTOMS 30 tablet 1   docusate sodium (COLACE) 100 MG capsule Take 1 capsule (100 mg total) by mouth 2 (two) times daily as needed for mild constipation. (Patient not taking: Reported on 05/02/2022) 30 capsule  0   EPINEPHrine 0.3 mg/0.3 mL IJ SOAJ injection Inject 0.3 mg into the muscle as needed for anaphylaxis. (Patient not taking: Reported on 05/02/2022) 1 each 1   meloxicam (MOBIC) 7.5 MG tablet Take 1 tablet every day by oral route. (Patient not taking: Reported on 05/02/2022) 60 tablet 1   Plecanatide (TRULANCE) 3 MG TABS Take 1 tablet by mouth daily. (Patient not taking: Reported on 05/02/2022) 90 tablet 1   simethicone (GAS-X) 80 MG chewable tablet Chew 1 tablet (80 mg total) by mouth 4 (four) times daily as needed for flatulence. (Patient not taking: Reported on 04/18/2022) 60 tablet 0   No current facility-administered medications for this visit.    General: negative for fevers, changes in weight or night sweats Skin: negative for changes in moles or sores or rash Eyes: negative for changes in vision HEENT: negative for change in hearing, tinnitus, voice changes Pulmonary: negative for dyspnea, orthopnea, productive cough, wheezing Cardiac: negative for palpitations, pain Gastrointestinal: negative for nausea, vomiting, diarrhea, hematemesis, hematochezia positive for constipation Genitourinary/Sexual: negative for dysuria, retention, hematuria, incontinence Ob/Gyn:  negative for abnormal bleeding, or pain Musculoskeletal: negative for pain, joint pain, back pain Hematology: negative for easy bruising, abnormal bleeding Neurologic/Psych: negative for headaches, seizures, paralysis, weakness, numbness  Objective:  Physical Examination:  BP 106/81 (BP Location: Right Arm, Patient Position: Sitting)   Pulse 76   Resp 18   Ht '5\' 2"'$  (1.575 m)   Wt 148 lb (67.1 kg)   BMI 27.07 kg/m      ECOG Performance Status: 0 - Asymptomatic  GENERAL: Patient is a well appearing female in no acute distress HEENT:  PERRL, neck supple with midline trachea.  NODES:  No cervical, supraclavicular, axillary, or inguinal lymphadenopathy palpated.  LUNGS:  Clear to auscultation bilaterally.  No wheezes or rhonchi. HEART:  Regular rate and rhythm. No murmur appreciated. ABDOMEN:  Soft, nontender.  Positive, normoactive bowel sounds.  MSK:  No focal spinal tenderness to palpation. Full range of motion bilaterally in the upper extremities. EXTREMITIES:  No peripheral edema.   SKIN:  Clear with no obvious rashes or skin changes.  NEURO:  Nonfocal. Well oriented.  Appropriate affect.  Pelvic: Exam chaperoned by Nursing. Patient verbalized consent prior to exam.  EGBUS: no lesions Cervix: no lesions, nontender, mobile Vagina: no lesions, no discharge or bleeding Uterus: normal size, nontender, mobile Adnexa: no palpable masses Rectovaginal: confirmatory  Lab Review Labs on site today:   Chemistry      Component Value Date/Time   NA 138 03/29/2022 1802   NA 141 10/23/2018 0849   NA 141 09/29/2014 1007   K 3.8 03/29/2022 1802   K 4.1 09/29/2014 1007   CL 105 03/29/2022 1802   CL 107 09/29/2014 1007   CO2 25 03/29/2022 1802   CO2 28 09/29/2014 1007   BUN 10 03/29/2022 1802   BUN 9 10/23/2018 0849   BUN 8 09/29/2014 1007   CREATININE 0.84 03/29/2022 1802   CREATININE 0.75 02/09/2022 0810   GLU 92 10/23/2018 0000      Component Value Date/Time   CALCIUM 9.0 03/29/2022 1802   CALCIUM 8.2 (L) 09/29/2014 1007   ALKPHOS 75 08/25/2021 1359   ALKPHOS 71 09/29/2014 1007   AST 10 02/09/2022 0810   AST 18 09/29/2014 1007   ALT 11 02/09/2022 0810   ALT 15 09/29/2014 1007   BILITOT 0.6 02/09/2022 0810   BILITOT 0.3 10/23/2018 0849   BILITOT 0.5 09/29/2014 1007  Lab Results  Component Value Date   WBC 13.8 (H) 03/29/2022   HGB 10.9 (L) 03/29/2022   HCT 34.3 (L) 03/29/2022   MCV  84.3 03/29/2022   PLT 383 03/29/2022       Assessment:  Zayna P Ferrando is a 46 y.o. 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  No other disease noted in the abdomen.   Normal post op course. She does not desire future fertility.    Plan:   Problem List Items Addressed This Visit       Endocrine   Granulosa cell tumor of ovary, right - Primary   We discussed options for management including observation vs completion surgery +/- chemotherapy.  In view of spontaneous rupture she is at increased risk of recurrence.  After discussing this with one of my partners, we believe that the best course of action would be to do a completion LS surgery with removal of the uterus, other adnexa and staging with washings, omentectomy and peritoneal biopsies.  If this is all negative for residual disease would be more comfortable with observation if she wishes.  If disease found then would definitely recommend carboplatin/taxol chemotherapy to reduce risk of recurrence.   Will order inhibin levels today.   She will RTC next week to discuss plans further and pre op work up if she agrees to proceed with surgery.  The patient's diagnosis, an outline of the further diagnostic and laboratory studies which will be required, the recommendation for surgery, and alternatives were discussed with her and her accompanying family members.  All questions were answered to their satisfaction.  A total of 60 minutes were spent with the patient/family today; 50% was spent in education, counseling and coordination of care for granulosa cell tumor of ovary.    Verlon Au, NP  CC:  Steele Sizer, MD [address] [place],  [place] [address] [phone]

## 2022-05-04 ENCOUNTER — Other Ambulatory Visit: Payer: Self-pay

## 2022-05-04 LAB — INHIBIN B: Inhibin B: 13.5 pg/mL

## 2022-05-08 LAB — INHIBIN A: Inhibin-A: 2.6 pg/mL

## 2022-05-09 ENCOUNTER — Inpatient Hospital Stay (HOSPITAL_BASED_OUTPATIENT_CLINIC_OR_DEPARTMENT_OTHER): Payer: No Typology Code available for payment source | Admitting: Obstetrics and Gynecology

## 2022-05-09 VITALS — BP 110/75 | HR 77 | Resp 18 | Ht 62.0 in | Wt 147.0 lb

## 2022-05-09 DIAGNOSIS — D3911 Neoplasm of uncertain behavior of right ovary: Secondary | ICD-10-CM

## 2022-05-09 NOTE — H&P (Signed)
Gynecologic Oncology H&P  Referring Provider: Dr. Marcelline Mates   Chief Complaint: granulosa cell tumor  Subjective:  Lori Beltran is a 46 y.o. female who is seen in consultation from Dr. Marcelline Mates for new diagnosis of granulosa cell tumor.   Patient presented to ER for RLQ abdominal pain. Was found to have hemoperitoneum, suspected ovarian source, RLQ pain, fibroid uterus. She was taken to OR by Dr. Marcelline Mates and Dr. Amalia Hailey on 03/30/22. Fallopian tubes previously surgically interrupted, appeared normal.  Left ovary appeared normal. Right ovary enlarged, 8-10 cm, with rupture. Moderate hemoperitoneum. Left ovary appeared normal but was removed along with tube. Was not sent for frozen pathology.   DIAGNOSIS:  A. FALLOPIAN TUBE AND OVARY, RIGHT; SALPINGO-OOPHORECTOMY:  - OVARY:       - GRANULOSA CELL TUMOR, ADULT TYPE, DISRUPTED.       - BACKGROUND OVARIAN PARENCHYMA WITH PHYSIOLOGIC CHANGES AND  STROMAL HEMORRHAGE, SUGGESTIVE OF EARLY TORSION.       - SEE CANCER SUMMARY.  - FALLOPIAN TUBE:       - BENIGN PARATUBAL CYST.       - OTHERWISE NO SIGNIFICANT HISTOPATHOLOGIC CHANGE.   pTNM CLASSIFICATION (AJCC 8th Edition):  pT1c2  FIGO STAGE: 1C2  She presents to clinic for evaluation and management.   CT scan A/P 03/29/22 FINDINGS: Lower chest: Lingular well-circumscribed 9 mm nodule on 01/06. Trace left pleural fluid. Normal heart size without pericardial or pleural effusion. Tiny hiatal hernia.   Hepatobiliary: Normal liver. Normal gallbladder, without biliary ductal dilatation.   Pancreas: Normal, without mass or ductal dilatation.   Spleen: Normal in size, without focal abnormality.   Adrenals/Urinary Tract: Normal adrenal glands. Normal kidneys, without hydronephrosis. Normal urinary bladder.   Stomach/Bowel: Normal stomach, without wall thickening. Normal colon, appendix, and terminal ileum. Normal small bowel.   Vascular/Lymphatic: Aortic atherosclerosis. Prominent gonadal  veins, greater right than left. No abdominopelvic adenopathy.   Reproductive: Small anterior uterine body fibroid including at 1.3 cm on 71/2.   Right and posterior/central pelvic heterogeneous 9.2 x 9.7 cm "mass" is favored to represent primarily hematoma. This presumably obscures the right ovary, which may be positioned superiorly and anteriorly on 61/2. Contiguous small volume hemorrhage extends into the paracollic gutters including in the left upper quadrant on 24/2.   Other: No free intraperitoneal air.   Musculoskeletal: No acute osseous abnormality. Mild convex left lumbar spine curvature.   IMPRESSION: 1. Heterogeneous right posterior pelvic mass, favored to represent hematoma, presumably obscuring the right ovary. Question rupture of an underlying right ovarian mass. Correlate with beta HCG level, as ruptured ectopic pregnancy could have this appearance. Extension of smaller volume hemorrhage into the mid and lower abdomen. 2. Lingular 9 mm pulmonary nodule, indeterminate. Presuming the patient is not eventually diagnosed with primary malignancy, follow-up guidelines as follows: Consider one of the following in 3 months for both low-risk and high-risk individuals: (a) repeat chest CT, (b) follow-up PET-CT, or (c) tissue sampling. This recommendation follows the consensus statement: Guidelines for Management of Incidental Pulmonary Nodules Detected on CT Images: From the Fleischner Society 2017; Radiology 2017; 284:228-243. 3. Trace left pleural fluid. 4. Normal appendix. 5. Small uterine fibroid.    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/2015   Chronic constipation 07/15/2015   History of iron deficiency anemia 07/15/2015   Migraine without aura and without status migrainosus, not intractable 05/04/2015   Environmental and seasonal allergies 05/04/2015   Plantar fasciitis of left foot 05/04/2015   Vitamin D deficiency 05/04/2015   GERD (gastroesophageal reflux disease) 05/04/2015    Past Medical History: Past Medical History:  Diagnosis Date   Arthritis    left knee   Cervical dysplasia    CINII   Constipation    GERD (gastroesophageal reflux disease)    Herpes    Migraine    Obesity    Pap smear abnormality of vagina with ASC-US    Plantar fasciitis    Pre-diabetes    Sleep apnea    Snoring    Vitamin D deficiency     Past Surgical History: Past Surgical History:  Procedure Laterality Date   COLONOSCOPY WITH PROPOFOL N/A 12/30/2020   Procedure: COLONOSCOPY WITH PROPOFOL;  Surgeon: Jonathon Bellows, MD;  Location: [facility] ENDOSCOPY;  Service: Gastroenterology;  Laterality: N/A;   COLPOSCOPY     ENDOMETRIAL ABLATION  2011   KNEE ARTHROSCOPY Left 11/23/2021   Procedure: ARTHROSCOPY KNEE, CHONDROPLASTY OF PATELLORFEMORAL JOINT, REMOVAL OF LOOSE BODIES;  Surgeon: Thornton Park, MD;  Location: [facility] ORS;  Service: Orthopedics;  Laterality: Left;   LAPAROSCOPY Right 03/29/2022   Procedure: LAPAROSCOPIC RIGHT SALPINGO-OOPHORECTOMY EVACUATION OF HEMOPERITONEUM ;  Surgeon: Rubie Maid, MD;  Location: [facility] ORS;  Service: Gynecology;  Laterality: Right;   LEEP     TUBAL LIGATION  2000    Past Gynecologic History:  Menarche: age 30 Sexually Active.  Pain with intercourse.  Hx of herpes.  Surgical contraception Denies history of abnormal pap  OB History:  OB History  Gravida Para Term Preterm AB Living  '3 3 3     3  '$ SAB IAB Ectopic Multiple Live Births          3    # Outcome Date GA Lbr Len/2nd Weight Sex  Delivery Anes PTL Lv  3 Term           2 Term           1 Term             Family History: Family History  Problem Relation Age of Onset   Sleep apnea Mother    Sleep apnea Father    Allergic rhinitis Daughter    Allergic rhinitis Son    Breast cancer Paternal Grandmother 38   Brain cancer Paternal Grandmother    Breast cancer Maternal Aunt 80   Colon cancer Maternal Uncle    Lung cancer Maternal Uncle    Prostate cancer Maternal Uncle    Bone cancer Maternal Aunt     Social History: Social History   Socioeconomic History   Marital status: Married    Spouse name: Gerald Stabs    Number of children: 3   Years of education: Not on file   Highest education level: Associate degree: occupational, Hotel manager, or vocational program  Occupational History   Not on file  Tobacco Use   Smoking status: Never   Smokeless tobacco: Never  Vaping Use   Vaping Use: Never used  Substance and Sexual Activity   Alcohol use: No    Alcohol/week: 0.0 standard drinks of alcohol   Drug use: No  Sexual activity: Yes    Partners: Male    Birth control/protection: Other-see comments, Surgical    Comment: TL  Other Topics Concern   Not on file  Social History Narrative   Not on file   Social Determinants of Health   Financial Resource Strain: Low Risk  (02/09/2022)   Overall Financial Resource Strain (CARDIA)    Difficulty of Paying Living Expenses: Not hard at all  Food Insecurity: No Food Insecurity (02/09/2022)   Hunger Vital Sign    Worried About Running Out of Food in the Last Year: Never true    Ran Out of Food in the Last Year: Never true  Transportation Needs: No Transportation Needs (02/09/2022)   PRAPARE - Hydrologist (Medical): No    Lack of Transportation (Non-Medical): No  Physical Activity: Sufficiently Active (02/09/2022)   Exercise Vital Sign    Days of Exercise per Week: 5 days    Minutes of Exercise per Session: 30 min  Stress: No Stress  Concern Present (02/09/2022)   [place]    Feeling of Stress : Not at all  Social Connections: Moderately Integrated (02/09/2022)   Social Connection and Isolation Panel [NHANES]    Frequency of Communication with Friends and Family: More than three times a week    Frequency of Social Gatherings with Friends and Family: More than three times a week    Attends Religious Services: More than 4 times per year    Active Member of [employer] or Organizations: No    Attends Archivist Meetings: Never    Marital Status: Married  Human resources officer Violence: Not At Risk (02/09/2022)   Humiliation, Afraid, Rape, and Kick questionnaire    Fear of Current or Ex-Partner: No    Emotionally Abused: No    Physically Abused: No    Sexually Abused: No    Allergies: Allergies  Allergen Reactions   Peanut Butter Flavor Itching and Swelling   Shellfish Allergy Itching and Swelling   Sodium Hyaluronate (Avian)     Patient reported avian allergy    Current Medications: Current Outpatient Medications  Medication Sig Dispense Refill   acetaminophen (TYLENOL) 500 MG tablet Take 2 tablets (1,000 mg total) by mouth every 6 (six) hours as needed. 60 tablet 0   cetirizine (ZYRTEC) 10 MG tablet Take 10 mg by mouth daily as needed.     Cholecalciferol (VITAMIN D) 50 MCG (2000 UT) CAPS Take 1 capsule by mouth daily.     docusate sodium (COLACE) 100 MG capsule Take 1 capsule (100 mg total) by mouth 2 (two) times daily as needed for mild constipation. (Patient not taking: Reported on 05/02/2022) 30 capsule 0   EPINEPHrine 0.3 mg/0.3 mL IJ SOAJ injection Inject 0.3 mg into the muscle as needed for anaphylaxis. (Patient not taking: Reported on 05/02/2022) 1 each 1   fluticasone (FLONASE) 50 MCG/ACT nasal spray Place 2 sprays into both nostrils daily. 16 g 0   meloxicam (MOBIC) 7.5 MG tablet Take 1 tablet every day by oral route. (Patient not taking:  Reported on 05/02/2022) 60 tablet 1   Multiple Vitamins-Minerals (MULTIVITAL) tablet Take 1 tablet by mouth daily.     omeprazole (PRILOSEC) 40 MG capsule TAKE 1 CAPSULE (40 MG TOTAL) BY MOUTH DAILY. (Patient taking differently: Take 40 mg by mouth daily as needed.) 30 capsule 5   Plecanatide (TRULANCE) 3 MG TABS Take 1 tablet by mouth daily. (Patient not  taking: Reported on 05/02/2022) 90 tablet 1   polyethylene glycol (MIRALAX / GLYCOLAX) 17 g packet Take 17 g by mouth daily. 14 each 0   Semaglutide-Weight Management (WEGOVY) 1.7 MG/0.75ML SOAJ Inject 1.7 mg into the skin once a week. 3 mL 2   simethicone (GAS-X) 80 MG chewable tablet Chew 1 tablet (80 mg total) by mouth 4 (four) times daily as needed for flatulence. (Patient not taking: Reported on 04/18/2022) 60 tablet 0   valACYclovir (VALTREX) 500 MG tablet TAKE 1 TABLET BY MOUTH TWICE DAILY FOR 3 DAYS AS NEEDED FOR SYMPTOMS 30 tablet 1   No current facility-administered medications for this visit.    General: negative for fevers, changes in weight or night sweats Skin: negative for changes in moles or sores or rash Eyes: negative for changes in vision HEENT: negative for change in hearing, tinnitus, voice changes Pulmonary: negative for dyspnea, orthopnea, productive cough, wheezing Cardiac: negative for palpitations, pain Gastrointestinal: negative for nausea, vomiting, diarrhea, hematemesis, hematochezia positive for constipation Genitourinary/Sexual: negative for dysuria, retention, hematuria, incontinence Ob/Gyn:  negative for abnormal bleeding, or pain Musculoskeletal: negative for pain, joint pain, back pain Hematology: negative for easy bruising, abnormal bleeding Neurologic/Psych: negative for headaches, seizures, paralysis, weakness, numbness  Objective:  Physical Examination:  There were no vitals taken for this visit.    ECOG Performance Status: 0 - Asymptomatic  GENERAL: Patient is a well appearing female in no acute  distress HEENT:  PERRL, neck supple with midline trachea.  NODES:  No cervical, supraclavicular, axillary, or inguinal lymphadenopathy palpated.  LUNGS:  Clear to auscultation bilaterally.  No wheezes or rhonchi. HEART:  Regular rate and rhythm. No murmur appreciated. ABDOMEN:  Soft, nontender.  Positive, normoactive bowel sounds.  MSK:  No focal spinal tenderness to palpation. Full range of motion bilaterally in the upper extremities. EXTREMITIES:  No peripheral edema.   SKIN:  Clear with no obvious rashes or skin changes.  NEURO:  Nonfocal. Well oriented.  Appropriate affect.  Pelvic: Exam chaperoned by Nursing. Patient verbalized consent prior to exam.  EGBUS: no lesions Cervix: no lesions, nontender, mobile Vagina: no lesions, no discharge or bleeding Uterus: normal size, nontender, mobile Adnexa: no palpable masses Rectovaginal: confirmatory  Lab Review Labs on site today:   Chemistry      Component Value Date/Time   NA 138 03/29/2022 1802   NA 141 10/23/2018 0849   NA 141 09/29/2014 1007   K 3.8 03/29/2022 1802   K 4.1 09/29/2014 1007   CL 105 03/29/2022 1802   CL 107 09/29/2014 1007   CO2 25 03/29/2022 1802   CO2 28 09/29/2014 1007   BUN 10 03/29/2022 1802   BUN 9 10/23/2018 0849   BUN 8 09/29/2014 1007   CREATININE 0.84 03/29/2022 1802   CREATININE 0.75 02/09/2022 0810   GLU 92 10/23/2018 0000      Component Value Date/Time   CALCIUM 9.0 03/29/2022 1802   CALCIUM 8.2 (L) 09/29/2014 1007   ALKPHOS 75 08/25/2021 1359   ALKPHOS 71 09/29/2014 1007   AST 10 02/09/2022 0810   AST 18 09/29/2014 1007   ALT 11 02/09/2022 0810   ALT 15 09/29/2014 1007   BILITOT 0.6 02/09/2022 0810   BILITOT 0.3 10/23/2018 0849   BILITOT 0.5 09/29/2014 1007     Lab Results  Component Value Date   WBC 13.8 (H) 03/29/2022   HGB 10.9 (L) 03/29/2022   HCT 34.3 (L) 03/29/2022   MCV 84.3 03/29/2022   PLT 383  03/29/2022   05/02/22 Inhibin A = 2.6 Inhibin B = 13.5    Assessment:   Lori Beltran is a 46 y.o. 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  No other disease noted in the abdomen.   Inhibin levels normal.   Normal post op course. She does not desire future fertility.    Plan:   Problem List Items Addressed This Visit       Endocrine   Granulosa cell tumor of ovary, right - Primary   We discussed options for management including observation vs completion surgery +/- chemotherapy.  In view of spontaneous rupture she is at increased risk of recurrence.  After discussing this with one of my partners, we believe that the best course of action would be to do a completion LS surgery with removal of the uterus, other adnexa and staging with washings, omentectomy and peritoneal biopsies and this has been scheduled for 01/11/22.  If this is all negative for residual disease would be more comfortable with observation if she wishes.  If disease found then would definitely recommend carboplatin/taxol chemotherapy to reduce risk of recurrence.  The risks of surgery were discussed in detail and she understands these to include infection; wound separation; hernia; vaginal cuff separation, injury to adjacent organs such as bowel, bladder, blood vessels, ureters and nerves; bleeding which may require blood transfusion; anesthesia risk; thromboembolic events; possible death; unforeseen complications; possible need for re-exploration; medical complications such as heart attack, stroke, pleural effusion and pneumonia; and, if staging performed the risk of lymphedema and lymphocyst.  The patient will receive DVT and antibiotic prophylaxis as indicated.  She voiced a clear understanding.  She had the opportunity to ask questions and written informed consent was obtained today.  The patient's diagnosis, an outline of the further diagnostic and laboratory studies which will be required, the recommendation  for surgery, and alternatives were discussed with her and her accompanying family members.  All questions were answered to their satisfaction.    Mellody Drown, MD  CC:  Steele Sizer, [place] [address] [place],  [place] [address] [phone]

## 2022-05-09 NOTE — Patient Instructions (Signed)
Surgery will be scheduled for June 13, 2022  We will arrange a pre-admit testing appointment. This will be a phone visit.  We will see you 4-6 weeks following surgery.    Please call [phone] with any questions pertaining to surgery     DIVISION OF GYNECOLOGIC ONCOLOGY BOWEL PREP   The following instructions are extremely important to prepare for your surgery. Please follow them carefully   Step 1: Liquid Diet Instructions              Clear Liquid Diet for GYN Oncology Patients Day Before Surgery The day before your scheduled surgery DO NOT EAT any solid foods.  We do want you to drink enough liquids, but NO MILK products.  We do not want you to be dehydrated.  Clear liquids are defined as no milk products and no pieces of any solid food. Drink at least 64 oz. of fluid.  The following are all approved for you to drink the day before you surgery. Chicken, Beef or Vegetable Broth (bouillon or consomm) - NO BROTH AFTER MIDNIGHT Plain Jello  (no fruit) Water Strained lemonade or fruit punch Gatorade (any flavor) CLEAR Ensure or Boost Breeze Fruit juices without pulp, such as apple, grape, or cranberry juice Clear sodas - NO SODA AFTER MIDNIGHT Ice Pops without bits of fruit or fruit pulp Honey Tea or coffee without milk or cream                 Any foods not on the above list should be avoided                                                                                             Step 2: Laxatives           The evening before surgery:   Time: around 5pm   Follow these instructions carefully.   Administer 1 Dulcolax suppository according to manufacturer instructions on the box. You will need to purchase this laxative at a pharmacy or grocery store.    Individual responses to laxatives vary; this prep may cause multiple bowel movements. It often works in 30 minutes and may take as long as 3 hours. Stay near an available bathroom.    It is important to stay hydrated.  Ensure you are still drinking clear liquids.     IMPORTANT: FOR YOUR SAFETY, WE WILL HAVE TO CANCEL YOUR SURGERY IF YOU DO NOT FOLLOW THESE INSTRUCTIONS.   Do not eat anything after midnight (including gum or candy) prior to your surgery. Avoid drinking carbonated beverages after midnight. You can have clear liquids up until one hour before you arrive at the hospital. "Nothing by mouth" means no liquids, gum, candy, etc for one hour before your arrival time.      Laparoscopy Laparoscopy is a procedure to diagnose diseases in the abdomen. During the procedure, a thin, lighted, pencil-sized instrument called a laparoscope is inserted into the abdomen through an incision. The laparoscope allows your health care provider to look at the organs inside your body. LET [facility] CARE PROVIDER KNOW ABOUT: Any allergies you have. All medicines  you are taking, including vitamins, herbs, eye drops, creams, and over-the-counter medicines. Previous problems you or members of your family have had with the use of anesthetics. Any blood disorders you have. Previous surgeries you have had. Medical conditions you have. RISKS AND COMPLICATIONS  Generally, this is a safe procedure. However, problems can occur, which may include: Infection. Bleeding. Damage to other organs. Allergic reaction to the anesthetics used during the procedure. BEFORE THE PROCEDURE Do not eat or drink anything after midnight on the night before the procedure or as directed by your health care provider. Ask your health care provider about: Changing or stopping your regular medicines. Taking medicines such as aspirin and ibuprofen. These medicines can thin your blood. Do not take these medicines before your procedure if your health care provider instructs you not to. Plan to have someone take you home after the procedure. PROCEDURE You may be given a medicine to help you relax (sedative). You will be given a medicine to make you  sleep (general anesthetic). Your abdomen will be inflated with a gas. This will make your organs easier to see. Small incisions will be made in your abdomen. A laparoscope and other small instruments will be inserted into the abdomen through the incisions. A tissue sample may be removed from an organ in the abdomen for examination. The instruments will be removed from the abdomen. The gas will be released. The incisions will be closed with stitches (sutures). AFTER THE PROCEDURE  Your blood pressure, heart rate, breathing rate, and blood oxygen level will be monitored often until the medicines you were given have worn off.   This information is not intended to replace advice given to you by your health care provider. Make sure you discuss any questions you have with your health care provider.                                             Bowel Symptoms After Surgery After gynecologic surgery, women often have temporary changes in bowel function (constipation and gas pain).  Following are tips to help prevent and treat common bowel problems.  It also tells you when to call the doctor.  This is important because some symptoms might be a sign of a more serious bowel problem such as obstruction (bowel blockage).  These problems are rare but can happen after gynecologic surgery.   Besides surgery, what can temporarily affect bowel function? 1. Dietary changes   2. Decreased physical activity   3.Antibiotics   4. Pain medication   How can I prevent constipation (three days or more without a stool)? Include fiber in your diet: whole grains, raw or dried fruits & vegetables, prunes, prune/pear juiceDrink at least 8 glasses of liquid (preferably water) every day Avoid: Gas forming foods such as broccoli, beans, peas, salads, cabbage, sweet potatoes Greasy, fatty, or fried foods Activity helps bowel function return to normal, walk around the house at least 3-4 times each day for 15 minutes or longer,  if tolerated.  Rocking in a rocking chair is preferable to sitting still. Stool softeners: these are not laxatives, but serve to soften the stool to avoid straining.  Take 2-4 times a day until normal bowel function returns         Examples: Colace or generic equivalent (Docusate) Bulk laxatives: provide a concentrated source of fiber.  They do not stimulate  the bowel.  Take 1-2 times each day until normal bowel function return.              Examples: Citrucel, Metamucil, Fiberal, Fibercon   What can I take for "Gas Pains"? Simethicone (Mylicon, Gas-X, Maalox-Gas, Mylanta-Gas) take 3-4 times a day Maalox Regular - take 3-4 times a day Mylanta Regular - take 3-4 times a day   What can I take if I become constipated? Start with stool softeners and add additional laxatives below as needed to have a bowel movement every 1-2 days  Stool softeners 1-2 tablets, 2 times a day Senokot 1-2 tablets, 1-2 times a day Glycerin suppository can soften hard stool take once a day Bisacodyl suppository once a day  Milk of Magnesia 30 mL 1-2 times a day Fleets or tap water enema    What can I do for nausea?  Limit most solid foods for 24-48 hours Continue eating small frequent amounts of liquids and/or bland soft foods Toast, crackers, cooked cereal (grits, cream of wheat, rice) Benadryl: a mild anti-nausea medicine can be obtained without a prescription. May cause drowsiness, especially if taken with narcotic pain medicines Contact provider for prescription nausea medication     What can I do, or take for diarrhea (more than five loose stools per day)? Drink plenty of clear fluids to prevent dehydration May take Kaopectate, Pepto-Bismol, Imodium, or probiotics for 1-2 days Anusol or Preparation-H can be helpful for hemorrhoids and irritated tissue around anus   When should I call the doctor?             CONSTIPATION:  Not relieved after three days following the above program VOMITING: That contains  blood, "coffee ground" material More the three times/hour and unable to keep down nausea medication for more than eight hours With dry mouth, dark or strong urine, feeling light-headed, dizzy, or confused With severe abdominal pain or bloating for more than 24 hours DIARRHEA: That continues for more then 24-48 hours despite treatment That contains blood or tarry material With dry mouth, dark or strong urine, feeling light~headed, dizzy, or confused FEVER: 101 F or higher along with nausea, vomiting, gas pain, diarrhea UNABLE TO: Pass gas from rectum for more than 24 hours Tolerate liquids by mouth for more than 24 hours        Laparoscopic Hysterectomy, Care After Refer to this sheet in the next few weeks. These instructions provide you with information on caring for yourself after your procedure. Your health care provider may also give you more specific instructions. Your treatment has been planned according to current medical practices, but problems sometimes occur. Call your health care provider if you have any problems or questions after your procedure. What can I expect after the procedure? Pain and bruising at the incision sites. You will be given pain medicine to control it. Menopausal symptoms such as hot flashes, night sweats, and insomnia if your ovaries were removed. Sore throat from the breathing tube that was inserted during surgery. Follow these instructions at home: Only take over-the-counter or prescription medicines for pain, discomfort, or fever as directed by your health care provider. Do not take aspirin. It can cause bleeding. Do not drive when taking pain medicine. Follow your health care provider's advice regarding diet, exercise, lifting, driving, and general activities. Resume your usual diet as directed and allowed. Get plenty of rest and sleep. Do not douche, use tampons, or have sexual intercourse for at least 6 weeks, or until your health care  provider gives  you permission. Change your bandages (dressings) as directed by your health care provider. Monitor your temperature and notify your health care provider of a fever. Take showers instead of baths for 2-3 weeks. Do not drink alcohol until your health care provider gives you permission. If you develop constipation, you may take a mild laxative with your health care provider's permission. Bran foods may help with constipation problems. Drinking enough fluids to keep your urine clear or pale yellow may help as well. Try to have someone home with you for 1-2 weeks to help around the house. Keep all of your follow-up appointments as directed by your health care provider. Contact a health care provider if: You have swelling, redness, or increasing pain around your incision sites. You have pus coming from your incision. You notice a bad smell coming from your incision. Your incision breaks open. You feel dizzy or lightheaded. You have pain or bleeding when you urinate. You have persistent diarrhea. You have persistent nausea and vomiting. You have abnormal vaginal discharge. You have a rash. You have any type of abnormal reaction or develop an allergy to your medicine. You have poor pain control with your prescribed medicine. Get help right away if: You have chest pain or shortness of breath. You have severe abdominal pain that is not relieved with pain medicine. You have pain or swelling in your legs. This information is not intended to replace advice given to you by your health care provider. Make sure you discuss any questions you have with your health care provider. Document Released: 07/29/2013 Document Revised: 03/15/2016 Document Reviewed: 04/28/2013 [employer] Interactive Patient Education  2017 [employer].

## 2022-05-09 NOTE — Progress Notes (Signed)
Gynecologic Oncology Pre op Visit   Referring Provider: Dr. Marcelline Mates   Chief Complaint: granulosa cell tumor, stage IC  Subjective:  Lori Beltran is a 46 y.o. female who is seen in consultation from Dr. Marcelline Mates for new diagnosis of granulosa cell tumor.   Patient returns for pre op visit.  No new complaints.  Gyn Oncology History Patient presented to ER for RLQ abdominal pain. Was found to have hemoperitoneum, suspected ovarian source, RLQ pain, fibroid uterus. She was taken to OR by Dr. Marcelline Mates and Dr. Amalia Hailey on 03/30/22. Fallopian tubes previously surgically interrupted, appeared normal.  Left ovary appeared normal. Right ovary enlarged, 8-10 cm, with rupture. Moderate hemoperitoneum. Left ovary appeared normal but was removed along with tube. Was not sent for frozen pathology.   DIAGNOSIS:  A. FALLOPIAN TUBE AND OVARY, RIGHT; SALPINGO-OOPHORECTOMY:  - OVARY:       - GRANULOSA CELL TUMOR, ADULT TYPE, DISRUPTED.       - BACKGROUND OVARIAN PARENCHYMA WITH PHYSIOLOGIC CHANGES AND  STROMAL HEMORRHAGE, SUGGESTIVE OF EARLY TORSION.       - SEE CANCER SUMMARY.  - FALLOPIAN TUBE:       - BENIGN PARATUBAL CYST.       - OTHERWISE NO SIGNIFICANT HISTOPATHOLOGIC CHANGE.   pTNM CLASSIFICATION (AJCC 8th Edition):  pT1c2  FIGO STAGE: 1C2  She presents to clinic for evaluation and management.   CT scan A/P 03/29/22 FINDINGS: Lower chest: Lingular well-circumscribed 9 mm nodule on 01/06. Trace left pleural fluid. Normal heart size without pericardial or pleural effusion. Tiny hiatal hernia.   Hepatobiliary: Normal liver. Normal gallbladder, without biliary ductal dilatation.   Pancreas: Normal, without mass or ductal dilatation.   Spleen: Normal in size, without focal abnormality.   Adrenals/Urinary Tract: Normal adrenal glands. Normal kidneys, without hydronephrosis. Normal urinary bladder.   Stomach/Bowel: Normal stomach, without wall thickening. Normal colon, appendix, and terminal  ileum. Normal small bowel.   Vascular/Lymphatic: Aortic atherosclerosis. Prominent gonadal veins, greater right than left. No abdominopelvic adenopathy.   Reproductive: Small anterior uterine body fibroid including at 1.3 cm on 71/2.   Right and posterior/central pelvic heterogeneous 9.2 x 9.7 cm "mass" is favored to represent primarily hematoma. This presumably obscures the right ovary, which may be positioned superiorly and anteriorly on 61/2. Contiguous small volume hemorrhage extends into the paracollic gutters including in the left upper quadrant on 24/2.   Other: No free intraperitoneal air.   Musculoskeletal: No acute osseous abnormality. Mild convex left lumbar spine curvature.   IMPRESSION: 1. Heterogeneous right posterior pelvic mass, favored to represent hematoma, presumably obscuring the right ovary. Question rupture of an underlying right ovarian mass. Correlate with beta HCG level, as ruptured ectopic pregnancy could have this appearance. Extension of smaller volume hemorrhage into the mid and lower abdomen. 2. Lingular 9 mm pulmonary nodule, indeterminate. Presuming the patient is not eventually diagnosed with primary malignancy, follow-up guidelines as follows: Consider one of the following in 3 months for both low-risk and high-risk individuals: (a) repeat chest CT, (b) follow-up PET-CT, or (c) tissue sampling. This recommendation follows the consensus statement: Guidelines for Management of Incidental Pulmonary Nodules Detected on CT Images: From the Fleischner Society 2017; Radiology 2017; 284:228-243. 3. Trace left pleural fluid. 4. Normal appendix. 5. Small uterine fibroid.    Problem List: Patient Active Problem List   Diagnosis Date Noted   Granulosa cell tumor of ovary, 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   Hemoperitoneum 03/29/2022   History of left knee surgery 03/08/2022   Pre-diabetes  03/08/2022   History of obesity 03/08/2022   Overweight (BMI 25.0-29.9)  03/08/2022   OSA on CPAP 07/20/2021   Primary osteoarthritis of both knees 01/31/2021   Angioedema 07/04/2018   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/2015   Chronic constipation 07/15/2015   History of iron deficiency anemia 07/15/2015   Migraine without aura and without status migrainosus, not intractable 05/04/2015   Environmental and seasonal allergies 05/04/2015   Plantar fasciitis of left foot 05/04/2015   Vitamin D deficiency 05/04/2015   GERD (gastroesophageal reflux disease) 05/04/2015    Past Medical History: Past Medical History:  Diagnosis Date   Arthritis    left knee   Cervical dysplasia    CINII   Constipation    GERD (gastroesophageal reflux disease)    Herpes    Migraine    Obesity    Pap smear abnormality of vagina with ASC-US    Plantar fasciitis    Pre-diabetes    Sleep apnea    Snoring    Vitamin D deficiency     Past Surgical History: Past Surgical History:  Procedure Laterality Date   COLONOSCOPY WITH PROPOFOL N/A 12/30/2020   Procedure: COLONOSCOPY WITH PROPOFOL;  Surgeon: Jonathon Bellows, MD;  Location[facility] END[facility]COPY;  Service: Gastroenterology;  Laterality: N/A;   COLPOSCOPY     ENDOMETRIAL ABLATION  2011   KNEE ARTHROSCOPY Left 11/23/2021   Procedure: ARTHROSCOPY KNEE, CHONDROPLASTY OF PATELLORFEMORAL JOINT, REMOVAL OF LOOSE BODIES;  Surgeon: Thornton Park, MD;  Location: [facility] ORS;  Service: Orthopedics;  Laterality: Left;   LAPAROSCOPY Right 03/29/2022   Procedure: LAPAROSCOPIC RIGHT SALPINGO-OOPHORECTOMY EVACUATION OF HEMOPERITONEUM ;  Surgeon: Rubie Maid, MD;  Location: [facility] ORS;  Service: Gynecology;  Laterality: Right;   LEEP     TUBAL LIGATION  2000    Past Gynecologic History:  Menarche: age 64 Sexually Active.  Pain with intercourse.  Hx of herpes.  Surgical contraception Denies history of abnormal pap  OB History:  OB History  Gravida Para Term Preterm AB Living  '3 3 3     3  '$ SAB  IAB Ectopic Multiple Live Births          3    # Outcome Date GA Lbr Len/2nd Weight Sex Delivery Anes PTL Lv  3 Term           2 Term           1 Term             Family History: Family History  Problem Relation Age of Onset   Sleep apnea Mother    Sleep apnea Father    Allergic rhinitis Daughter    Allergic rhinitis Son    Breast cancer Paternal Grandmother 37   Brain cancer Paternal Grandmother    Breast cancer Maternal Aunt 80   Colon cancer Maternal Uncle    Lung cancer Maternal Uncle    Prostate cancer Maternal Uncle    Bone cancer Maternal Aunt     Social History: Social History   Socioeconomic History   Marital status: Married    Spouse name: Gerald Stabs    Number of children: 3   Years of education: Not on file   Highest education level: Associate degree: occupational, Hotel manager, or vocational program  Occupational History   Not on file  Tobacco Use   Smoking status: Never   Smokeless tobacco: Never  Vaping Use   Vaping Use: Never used  Substance and  Sexual Activity   Alcohol use: No    Alcohol/week: 0.0 standard drinks of alcohol   Drug use: No   Sexual activity: Yes    Partners: Male    Birth control/protection: Other-see comments, Surgical    Comment: TL  Other Topics Concern   Not on file  Social History Narrative   Not on file   Social Determinants of Health   Financial Resource Strain: Low Risk  (02/09/2022)   Overall Financial Resource Strain (CARDIA)    Difficulty of Paying Living Expenses: Not hard at all  Food Insecurity: No Food Insecurity (02/09/2022)   Hunger Vital Sign    Worried About Running Out of Food in the Last Year: Never true    Ran Out of Food in the Last Year: Never true  Transportation Needs: No Transportation Needs (02/09/2022)   PRAPARE - Hydrologist (Medical): No    Lack of Transportation (Non-Medical): No  Physical Activity: Sufficiently Active (02/09/2022)   Exercise Vital Sign    Days of  Exercise per Week: 5 days    Minutes of Exercise per Session: 30 min  Stress: No Stress Concern Present (02/09/2022)   [place]    Feeling of Stress : Not at all  Social Connections: Moderately Integrated (02/09/2022)   Social Connection and Isolation Panel [NHANES]    Frequency of Communication with Friends and Family: More than three times a week    Frequency of Social Gatherings with Friends and Family: More than three times a week    Attends Religious Services: More than 4 times per year    Active Member of [employer] or Organizations: No    Attends Archivist Meetings: Never    Marital Status: Married  Human resources officer Violence: Not At Risk (02/09/2022)   Humiliation, Afraid, Rape, and Kick questionnaire    Fear of Current or Ex-Partner: No    Emotionally Abused: No    Physically Abused: No    Sexually Abused: No    Allergies: Allergies  Allergen Reactions   Peanut Butter Flavor Itching and Swelling   Shellfish Allergy Itching and Swelling   Sodium Hyaluronate (Avian)     Patient reported avian allergy    Current Medications: Current Outpatient Medications  Medication Sig Dispense Refill   acetaminophen (TYLENOL) 500 MG tablet Take 2 tablets (1,000 mg total) by mouth every 6 (six) hours as needed. 60 tablet 0   cetirizine (ZYRTEC) 10 MG tablet Take 10 mg by mouth daily as needed.     Cholecalciferol (VITAMIN D) 50 MCG (2000 UT) CAPS Take 1 capsule by mouth daily.     docusate sodium (COLACE) 100 MG capsule Take 1 capsule (100 mg total) by mouth 2 (two) times daily as needed for mild constipation. (Patient not taking: Reported on 05/02/2022) 30 capsule 0   EPINEPHrine 0.3 mg/0.3 mL IJ SOAJ injection Inject 0.3 mg into the muscle as needed for anaphylaxis. (Patient not taking: Reported on 05/02/2022) 1 each 1   fluticasone (FLONASE) 50 MCG/ACT nasal spray Place 2 sprays into both nostrils daily. 16 g 0    meloxicam (MOBIC) 7.5 MG tablet Take 1 tablet every day by oral route. (Patient not taking: Reported on 05/02/2022) 60 tablet 1   Multiple Vitamins-Minerals (MULTIVITAL) tablet Take 1 tablet by mouth daily.     omeprazole (PRILOSEC) 40 MG capsule TAKE 1 CAPSULE (40 MG TOTAL) BY MOUTH DAILY. (Patient taking differently: Take 40 mg  by mouth daily as needed.) 30 capsule 5   Plecanatide (TRULANCE) 3 MG TABS Take 1 tablet by mouth daily. (Patient not taking: Reported on 05/02/2022) 90 tablet 1   polyethylene glycol (MIRALAX / GLYCOLAX) 17 g packet Take 17 g by mouth daily. 14 each 0   Semaglutide-Weight Management (WEGOVY) 1.7 MG/0.75ML SOAJ Inject 1.7 mg into the skin once a week. 3 mL 2   simethicone (GAS-X) 80 MG chewable tablet Chew 1 tablet (80 mg total) by mouth 4 (four) times daily as needed for flatulence. (Patient not taking: Reported on 04/18/2022) 60 tablet 0   valACYclovir (VALTREX) 500 MG tablet TAKE 1 TABLET BY MOUTH TWICE DAILY FOR 3 DAYS AS NEEDED FOR SYMPTOMS 30 tablet 1   No current facility-administered medications for this visit.    General: negative for fevers, changes in weight or night sweats Skin: negative for changes in moles or sores or rash Eyes: negative for changes in vision HEENT: negative for change in hearing, tinnitus, voice changes Pulmonary: negative for dyspnea, orthopnea, productive cough, wheezing Cardiac: negative for palpitations, pain Gastrointestinal: negative for nausea, vomiting, diarrhea, hematemesis, hematochezia positive for constipation Genitourinary/Sexual: negative for dysuria, retention, hematuria, incontinence Ob/Gyn:  negative for abnormal bleeding, or pain Musculoskeletal: negative for pain, joint pain, back pain Hematology: negative for easy bruising, abnormal bleeding Neurologic/Psych: negative for headaches, seizures, paralysis, weakness, numbness  Objective:  Physical Examination:  There were no vitals taken for this visit.    ECOG  Performance Status: 0 - Asymptomatic  GENERAL: Patient is a well appearing female in no acute distress HEENT:  PERRL, neck supple with midline trachea.  NODES:  No cervical, supraclavicular, axillary, or inguinal lymphadenopathy palpated.  LUNGS:  Clear to auscultation bilaterally.  No wheezes or rhonchi. HEART:  Regular rate and rhythm. No murmur appreciated. ABDOMEN:  Soft, nontender.  Positive, normoactive bowel sounds.  MSK:  No focal spinal tenderness to palpation. Full range of motion bilaterally in the upper extremities. EXTREMITIES:  No peripheral edema.   SKIN:  Clear with no obvious rashes or skin changes.  NEURO:  Nonfocal. Well oriented.  Appropriate affect.  Pelvic: Exam chaperoned by Nursing. Patient verbalized consent prior to exam.  EGBUS: no lesions Cervix: no lesions, nontender, mobile Vagina: no lesions, no discharge or bleeding Uterus: normal size, nontender, mobile Adnexa: no palpable masses Rectovaginal: confirmatory  Lab Review Labs on site today:   Chemistry      Component Value Date/Time   NA 138 03/29/2022 1802   NA 141 10/23/2018 0849   NA 141 09/29/2014 1007   K 3.8 03/29/2022 1802   K 4.1 09/29/2014 1007   CL 105 03/29/2022 1802   CL 107 09/29/2014 1007   CO2 25 03/29/2022 1802   CO2 28 09/29/2014 1007   BUN 10 03/29/2022 1802   BUN 9 10/23/2018 0849   BUN 8 09/29/2014 1007   CREATININE 0.84 03/29/2022 1802   CREATININE 0.75 02/09/2022 0810   GLU 92 10/23/2018 0000      Component Value Date/Time   CALCIUM 9.0 03/29/2022 1802   CALCIUM 8.2 (L) 09/29/2014 1007   ALKPHOS 75 08/25/2021 1359   ALKPHOS 71 09/29/2014 1007   AST 10 02/09/2022 0810   AST 18 09/29/2014 1007   ALT 11 02/09/2022 0810   ALT 15 09/29/2014 1007   BILITOT 0.6 02/09/2022 0810   BILITOT 0.3 10/23/2018 0849   BILITOT 0.5 09/29/2014 1007     Lab Results  Component Value Date   WBC 13.8 (  H) 03/29/2022   HGB 10.9 (L) 03/29/2022   HCT 34.3 (L) 03/29/2022   MCV 84.3  03/29/2022   PLT 383 03/29/2022   05/02/22 Inhibin A = 2.6 Inhibin B = 13.5    Assessment:  Lori Beltran is a 46 y.o. 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  No other disease noted in the abdomen.   Inhibin levels normal.   Normal post op course. She does not desire future fertility.    Plan:   Problem List Items Addressed This Visit       Endocrine   Granulosa cell tumor of ovary, right - Primary   We discussed options for management including observation vs completion surgery +/- chemotherapy.  In view of spontaneous rupture she is at increased risk of recurrence.  After discussing this with one of my partners, we believe that the best course of action would be to do a completion LS surgery with removal of the uterus, other adnexa and staging with washings, omentectomy and peritoneal biopsies and this has been scheduled for 01/11/22 with Drs. [place] and [employer].  If this is all negative for residual disease would be more comfortable with observation if she wishes.  If disease found then would definitely recommend carboplatin/taxol chemotherapy to reduce risk of recurrence.  She has no menses due to endometrial ablation and may be pre menopausal still.  Discussed possibility of HRT after surgery if she has severe menopausal symptoms if restaging surgery is negative.   The risks of surgery were discussed in detail and she understands these to include infection; wound separation; hernia; vaginal cuff separation, injury to adjacent organs such as bowel, bladder, blood vessels, ureters and nerves; bleeding which may require blood transfusion; anesthesia risk; thromboembolic events; possible death; unforeseen complications; possible need for re-exploration; medical complications such as heart attack, stroke, pleural effusion and pneumonia; and, if staging performed the risk of lymphedema and lymphocyst.   The patient will receive DVT and antibiotic prophylaxis as indicated.  She voiced a clear understanding.  She had the opportunity to ask questions and written informed consent was obtained today.  The patient's diagnosis, an outline of the further diagnostic and laboratory studies which will be required, the recommendation for surgery, and alternatives were discussed with her and her accompanying family members.  All questions were answered to their satisfaction.   Mellody Drown, MD  CC:  Steele Sizer, [facility] [address] [place],  [place] [address] [phone]

## 2022-05-28 ENCOUNTER — Other Ambulatory Visit: Payer: Self-pay

## 2022-06-04 ENCOUNTER — Encounter
Admission: RE | Admit: 2022-06-04 | Discharge: 2022-06-04 | Disposition: A | Discharge status: Home / Self Care | Payer: No Typology Code available for payment source | Source: Ambulatory Visit | Attending: Obstetrics and Gynecology | Admitting: Obstetrics and Gynecology

## 2022-06-04 VITALS — Ht 62.0 in | Wt 143.0 lb

## 2022-06-04 DIAGNOSIS — Z01812 Encounter for preprocedural laboratory examination: Secondary | ICD-10-CM

## 2022-06-04 NOTE — Patient Instructions (Addendum)
Your procedure is scheduled on: Wednesday June 13, 2022. Report to Day Surgery inside [facility] 2nd floor, stop by admissions desk before getting on elevator.  To find out your arrival time please call [phone] between 1PM - 3PM on Tuesday June 12, 2022.  Remember: Instructions that are not followed completely may result in serious medical risk,  up to and including death, or upon the discretion of your surgeon and anesthesiologist your  surgery may need to be rescheduled.     _X__ 1. Do not eat food after midnight the night before your procedure. Follow doctor's instructions for preparation of surgery.                  No chewing gum or hard candies. You may drink clear liquids up to 2 hours                 before you are scheduled to arrive for your surgery- DO not drink clear                 liquids within 2 hours of the start of your surgery.                 Clear Liquids include:  water.  __X__2.  On the morning of surgery brush your teeth with toothpaste and water, you                may rinse your mouth with mouthwash if you wish.  Do not swallow any toothpaste or mouthwash.     _X__ 3.  No Alcohol for 24 hours before or after surgery.   _X__ 4.  Do Not Smoke or use e-cigarettes For 24 Hours Prior to Your Surgery.                 Do not use any chewable tobacco products for at least 6 hours prior to                 Surgery.  _X__  5.  Do not use any recreational drugs (marijuana, cocaine, heroin, ecstasy, MDMA or other)                For at least one week prior to your surgery.  Combination of these drugs with anesthesia                May have life threatening results.  ____  6.  Bring all medications with you on the day of surgery if instructed.   __X_ 7.  Notify your doctor if there is any change in your medical condition      (cold, fever, infections).     Do not wear jewelry, make-up, hairpins, clips or nail polish. Do not wear  lotions, powders, or perfumes. You may wear deodorant. Do not shave 48 hours prior to surgery. Men may shave face and neck. Do not bring valuables to the hospital.    [facility] is not responsible for any belongings or valuables.  Contacts, dentures or bridgework may not be worn into surgery. Leave your suitcase in the car. After surgery it may be brought to your room. For patients admitted to the hospital, discharge time is determined by your treatment team.   Patients discharged the day of surgery will not be allowed to drive home.   Make arrangements for someone to be with you for the first 24 hours of your Same Day Discharge.   _X___ Take these medicines the morning of surgery  with A SIP OF WATER:   1. omeprazole (PRILOSEC) 40 MG capsule  2.   3.   4.  5.  6.  ____ Fleet Enema (as directed)   __X__ Use CHG Soap (or wipes) as directed  ____ Use Benzoyl Peroxide Gel as instructed  ____ Use inhalers on the day of surgery  ____ Stop metformin 2 days prior to surgery    ____ Take 1/2 of usual insulin dose the night before surgery. No insulin the morning          of surgery.   __X __ One week prior to your surgery stop Semaglutide-Weight Management (WEGOVY) 1.7 MG/0.75ML SOAJ injections before your surgery.   __X__ One Week prior to surgery- Stop Anti-inflammatories such as Ibuprofen, Aleve, Advil, Motrin, meloxicam (MOBIC), diclofenac, etodolac, ketorolac, Toradol, Daypro, piroxicam, Goody's or BC powders. OK TO USE TYLENOL IF NEEDED   __X__ Stop ALL supplements until after surgery.    ____ Bring C-Pap to the hospital.    If you have any questions regarding your pre-procedure instructions,  Please call Pre-admit Testing at [phone]

## 2022-06-05 ENCOUNTER — Encounter
Admission: RE | Admit: 2022-06-05 | Discharge: 2022-06-05 | Disposition: A | Discharge status: Home / Self Care | Payer: No Typology Code available for payment source | Source: Ambulatory Visit | Attending: Obstetrics and Gynecology | Admitting: Obstetrics and Gynecology

## 2022-06-05 DIAGNOSIS — Z01812 Encounter for preprocedural laboratory examination: Secondary | ICD-10-CM | POA: Diagnosis present

## 2022-06-05 DIAGNOSIS — M170 Bilateral primary osteoarthritis of knee: Secondary | ICD-10-CM

## 2022-06-05 DIAGNOSIS — D3911 Neoplasm of uncertain behavior of right ovary: Secondary | ICD-10-CM | POA: Insufficient documentation

## 2022-06-05 LAB — CBC WITH DIFFERENTIAL/PLATELET
Abs Immature Granulocytes: 0.02 10*3/uL (ref 0.00–0.07)
Basophils Absolute: 0.1 10*3/uL (ref 0.0–0.1)
Basophils Relative: 2 %
Eosinophils Absolute: 0.2 10*3/uL (ref 0.0–0.5)
Eosinophils Relative: 3 %
HCT: 34.8 % — ABNORMAL LOW (ref 36.0–46.0)
Hemoglobin: 10.9 g/dL — ABNORMAL LOW (ref 12.0–15.0)
Immature Granulocytes: 0 %
Lymphocytes Relative: 43 %
Lymphs Abs: 2.8 10*3/uL (ref 0.7–4.0)
MCH: 25.9 pg — ABNORMAL LOW (ref 26.0–34.0)
MCHC: 31.3 g/dL (ref 30.0–36.0)
MCV: 82.7 fL (ref 80.0–100.0)
Monocytes Absolute: 0.4 10*3/uL (ref 0.1–1.0)
Monocytes Relative: 6 %
Neutro Abs: 3 10*3/uL (ref 1.7–7.7)
Neutrophils Relative %: 46 %
Platelets: 410 10*3/uL — ABNORMAL HIGH (ref 150–400)
RBC: 4.21 MIL/uL (ref 3.87–5.11)
RDW: 14.3 % (ref 11.5–15.5)
WBC: 6.4 10*3/uL (ref 4.0–10.5)
nRBC: 0 % (ref 0.0–0.2)

## 2022-06-05 LAB — TYPE AND SCREEN
ABO/RH(D): O POS
Antibody Screen: NEGATIVE

## 2022-06-05 LAB — COMPREHENSIVE METABOLIC PANEL
ALT: 14 U/L (ref 0–44)
AST: 13 U/L — ABNORMAL LOW (ref 15–41)
Albumin: 4.1 g/dL (ref 3.5–5.0)
Alkaline Phosphatase: 75 U/L (ref 38–126)
Anion gap: 6 (ref 5–15)
BUN: 18 mg/dL (ref 6–20)
CO2: 24 mmol/L (ref 22–32)
Calcium: 8.9 mg/dL (ref 8.9–10.3)
Chloride: 109 mmol/L (ref 98–111)
Creatinine, Ser: 0.74 mg/dL (ref 0.44–1.00)
GFR, Estimated: >60 mL/min (ref >60)
Glucose, Bld: 99 mg/dL (ref 70–99)
Potassium: 3.3 mmol/L — ABNORMAL LOW (ref 3.5–5.1)
Sodium: 139 mmol/L (ref 135–145)
Total Bilirubin: 0.3 mg/dL (ref 0.3–1.2)
Total Protein: 7.1 g/dL (ref 6.5–8.1)

## 2022-06-06 LAB — URINALYSIS, ROUTINE W REFLEX MICROSCOPIC
Bilirubin Urine: NEGATIVE
Glucose, UA: NEGATIVE mg/dL
Hgb urine dipstick: NEGATIVE
Ketones, ur: NEGATIVE mg/dL
Leukocytes,Ua: NEGATIVE
Nitrite: NEGATIVE
Protein, ur: NEGATIVE mg/dL
Specific Gravity, Urine: 1.028 (ref 1.005–1.030)
pH: 5 (ref 5.0–8.0)

## 2022-06-13 ENCOUNTER — Ambulatory Visit
Admission: RE | Admit: 2022-06-13 | Discharge: 2022-06-13 | Disposition: A | Discharge status: Home / Self Care | Payer: No Typology Code available for payment source | Source: Ambulatory Visit | Attending: Obstetrics and Gynecology | Admitting: Obstetrics and Gynecology

## 2022-06-13 ENCOUNTER — Other Ambulatory Visit: Payer: Self-pay

## 2022-06-13 ENCOUNTER — Ambulatory Visit: Payer: No Typology Code available for payment source | Admitting: Anesthesiology

## 2022-06-13 ENCOUNTER — Encounter
Admission: RE | Disposition: A | Discharge status: Home / Self Care | Payer: Self-pay | Source: Ambulatory Visit | Attending: Obstetrics and Gynecology

## 2022-06-13 ENCOUNTER — Encounter: Payer: Self-pay | Admitting: Obstetrics and Gynecology

## 2022-06-13 ENCOUNTER — Ambulatory Visit: Payer: No Typology Code available for payment source | Admitting: Urgent Care

## 2022-06-13 DIAGNOSIS — K219 Gastro-esophageal reflux disease without esophagitis: Secondary | ICD-10-CM | POA: Diagnosis not present

## 2022-06-13 DIAGNOSIS — Z862 Personal history of diseases of the blood and blood-forming organs and certain disorders involving the immune mechanism: Secondary | ICD-10-CM

## 2022-06-13 DIAGNOSIS — D3911 Neoplasm of uncertain behavior of right ovary: Secondary | ICD-10-CM

## 2022-06-13 DIAGNOSIS — Z79899 Other long term (current) drug therapy: Secondary | ICD-10-CM | POA: Insufficient documentation

## 2022-06-13 DIAGNOSIS — D3912 Neoplasm of uncertain behavior of left ovary: Secondary | ICD-10-CM | POA: Diagnosis not present

## 2022-06-13 DIAGNOSIS — Z01812 Encounter for preprocedural laboratory examination: Secondary | ICD-10-CM

## 2022-06-13 DIAGNOSIS — E876 Hypokalemia: Secondary | ICD-10-CM | POA: Insufficient documentation

## 2022-06-13 DIAGNOSIS — D259 Leiomyoma of uterus, unspecified: Secondary | ICD-10-CM | POA: Insufficient documentation

## 2022-06-13 DIAGNOSIS — D252 Subserosal leiomyoma of uterus: Secondary | ICD-10-CM | POA: Diagnosis not present

## 2022-06-13 DIAGNOSIS — G4730 Sleep apnea, unspecified: Secondary | ICD-10-CM | POA: Insufficient documentation

## 2022-06-13 DIAGNOSIS — N838 Other noninflammatory disorders of ovary, fallopian tube and broad ligament: Secondary | ICD-10-CM | POA: Diagnosis not present

## 2022-06-13 DIAGNOSIS — Z90710 Acquired absence of both cervix and uterus: Secondary | ICD-10-CM

## 2022-06-13 HISTORY — PX: LAPAROSCOPIC HYSTERECTOMY: SHX1926

## 2022-06-13 LAB — POCT PREGNANCY, URINE: Preg Test, Ur: NEGATIVE

## 2022-06-13 SURGERY — HYSTERECTOMY, TOTAL, LAPAROSCOPIC
Anesthesia: General | Site: Abdomen

## 2022-06-13 MED ORDER — DEXAMETHASONE SODIUM PHOSPHATE 10 MG/ML IJ SOLN
INTRAMUSCULAR | Status: DC | PRN
  Administered 2022-06-13: 6 mg via INTRAVENOUS

## 2022-06-13 MED ORDER — MIDAZOLAM HCL 2 MG/2ML IJ SOLN
INTRAMUSCULAR | Status: AC
Start: 2022-06-13 — End: ?
  Filled 2022-06-13: qty 2

## 2022-06-13 MED ORDER — CHLORHEXIDINE GLUCONATE 0.12 % MT SOLN: 15.0000 mL | Freq: Once | OROMUCOSAL | Status: AC

## 2022-06-13 MED ORDER — CEFAZOLIN SODIUM-DEXTROSE 2-4 GM/100ML-% IV SOLN
INTRAVENOUS | Status: AC
  Filled 2022-06-13: qty 100

## 2022-06-13 MED ORDER — ROCURONIUM BROMIDE 100 MG/10ML IV SOLN
INTRAVENOUS | Status: DC | PRN
  Administered 2022-06-13: 10 mg via INTRAVENOUS
  Administered 2022-06-13: 20 mg via INTRAVENOUS
  Administered 2022-06-13: 50 mg via INTRAVENOUS

## 2022-06-13 MED ORDER — IBUPROFEN 600 MG PO TABS
600.0000 mg | ORAL_TABLET | Freq: Four times a day (QID) | ORAL | 1 refills | Status: DC | PRN
  Filled 2022-06-13: qty 60, 15d supply, fill #0

## 2022-06-13 MED ORDER — PROPOFOL 10 MG/ML IV BOLUS
INTRAVENOUS | Status: DC | PRN
  Administered 2022-06-13: 120 mg via INTRAVENOUS

## 2022-06-13 MED ORDER — SUGAMMADEX SODIUM 200 MG/2ML IV SOLN
INTRAVENOUS | Status: DC | PRN
  Administered 2022-06-13: 200 mg via INTRAVENOUS

## 2022-06-13 MED ORDER — ONDANSETRON HCL 4 MG/2ML IJ SOLN
INTRAMUSCULAR | Status: DC | PRN
  Administered 2022-06-13: 4 mg via INTRAVENOUS

## 2022-06-13 MED ORDER — OXYCODONE HCL 5 MG PO TABS
ORAL_TABLET | ORAL | Status: AC
  Filled 2022-06-13: qty 1

## 2022-06-13 MED ORDER — KETOROLAC TROMETHAMINE 30 MG/ML IJ SOLN
INTRAMUSCULAR | Status: AC
  Filled 2022-06-13: qty 1

## 2022-06-13 MED ORDER — DEXMEDETOMIDINE HCL IN NACL 80 MCG/20ML IV SOLN
INTRAVENOUS | Status: AC
Start: 2022-06-13 — End: ?
  Filled 2022-06-13: qty 20

## 2022-06-13 MED ORDER — ACETAMINOPHEN 500 MG PO TABS
ORAL_TABLET | ORAL | Status: AC
  Administered 2022-06-13: 1000 mg via ORAL
  Filled 2022-06-13: qty 2

## 2022-06-13 MED ORDER — DEXAMETHASONE SODIUM PHOSPHATE 10 MG/ML IJ SOLN
INTRAMUSCULAR | Status: AC
  Filled 2022-06-13: qty 1

## 2022-06-13 MED ORDER — PROPOFOL 10 MG/ML IV BOLUS
INTRAVENOUS | Status: AC
  Filled 2022-06-13: qty 20

## 2022-06-13 MED ORDER — ACETAMINOPHEN 500 MG PO TABS: 1000.0000 mg | ORAL_TABLET | ORAL | Status: AC

## 2022-06-13 MED ORDER — 0.9 % SODIUM CHLORIDE (POUR BTL) OPTIME
TOPICAL | Status: DC | PRN
  Administered 2022-06-13: 500 mL

## 2022-06-13 MED ORDER — DROPERIDOL 2.5 MG/ML IJ SOLN: 0.6250 mg | Freq: Once | INTRAMUSCULAR | Status: DC | PRN

## 2022-06-13 MED ORDER — OXYCODONE HCL 5 MG/5ML PO SOLN: 5.0000 mg | Freq: Once | ORAL | Status: AC | PRN

## 2022-06-13 MED ORDER — PHENYLEPHRINE HCL (PRESSORS) 10 MG/ML IV SOLN
INTRAVENOUS | Status: DC | PRN
  Administered 2022-06-13: 160 ug via INTRAVENOUS
  Administered 2022-06-13 (×2): 80 ug via INTRAVENOUS

## 2022-06-13 MED ORDER — FAMOTIDINE 20 MG PO TABS: 20.0000 mg | ORAL_TABLET | Freq: Once | ORAL | Status: DC

## 2022-06-13 MED ORDER — HEPARIN SODIUM (PORCINE) 5000 UNIT/ML IJ SOLN
INTRAMUSCULAR | Status: AC
  Administered 2022-06-13: 5000 [IU] via SUBCUTANEOUS
  Filled 2022-06-13: qty 1

## 2022-06-13 MED ORDER — DIPHENHYDRAMINE HCL 50 MG/ML IJ SOLN
INTRAMUSCULAR | Status: DC | PRN
  Administered 2022-06-13: 12.5 mg via INTRAVENOUS

## 2022-06-13 MED ORDER — LIDOCAINE HCL (PF) 2 % IJ SOLN
INTRAMUSCULAR | Status: AC
Start: 2022-06-13 — End: ?
  Filled 2022-06-13: qty 5

## 2022-06-13 MED ORDER — ROCURONIUM BROMIDE 10 MG/ML (PF) SYRINGE
PREFILLED_SYRINGE | INTRAVENOUS | Status: AC
Start: 2022-06-13 — End: ?
  Filled 2022-06-13: qty 10

## 2022-06-13 MED ORDER — BUPIVACAINE HCL (PF) 0.5 % IJ SOLN
INTRAMUSCULAR | Status: DC | PRN
  Administered 2022-06-13: 13 mL

## 2022-06-13 MED ORDER — CHLORHEXIDINE GLUCONATE CLOTH 2 % EX PADS: 6.0000 | MEDICATED_PAD | Freq: Once | CUTANEOUS | Status: DC

## 2022-06-13 MED ORDER — KETAMINE HCL 10 MG/ML IJ SOLN
INTRAMUSCULAR | Status: DC | PRN
  Administered 2022-06-13: 10 mg via INTRAVENOUS
  Administered 2022-06-13 (×2): 20 mg via INTRAVENOUS

## 2022-06-13 MED ORDER — LACTATED RINGERS IV SOLN: INTRAVENOUS | Status: DC

## 2022-06-13 MED ORDER — LIDOCAINE HCL (CARDIAC) PF 100 MG/5ML IV SOSY
PREFILLED_SYRINGE | INTRAVENOUS | Status: DC | PRN
  Administered 2022-06-13: 100 mg via INTRAVENOUS

## 2022-06-13 MED ORDER — OXYCODONE HCL 5 MG PO TABS
5.0000 mg | ORAL_TABLET | Freq: Four times a day (QID) | ORAL | 0 refills | Status: DC | PRN
  Filled 2022-06-13: qty 20, 3d supply, fill #0

## 2022-06-13 MED ORDER — BUPIVACAINE HCL (PF) 0.5 % IJ SOLN
INTRAMUSCULAR | Status: AC
  Filled 2022-06-13: qty 30

## 2022-06-13 MED ORDER — DEXMEDETOMIDINE HCL IN NACL 200 MCG/50ML IV SOLN
INTRAVENOUS | Status: DC | PRN
  Administered 2022-06-13 (×2): 8 ug via INTRAVENOUS
  Administered 2022-06-13: 4 ug via INTRAVENOUS

## 2022-06-13 MED ORDER — ONDANSETRON HCL 4 MG/2ML IJ SOLN
INTRAMUSCULAR | Status: AC
Start: 2022-06-13 — End: ?
  Filled 2022-06-13: qty 2

## 2022-06-13 MED ORDER — PROMETHAZINE HCL 25 MG/ML IJ SOLN: 6.2500 mg | INTRAMUSCULAR | Status: DC | PRN

## 2022-06-13 MED ORDER — MIDAZOLAM HCL 2 MG/2ML IJ SOLN
INTRAMUSCULAR | Status: DC | PRN
  Administered 2022-06-13: 2 mg via INTRAVENOUS

## 2022-06-13 MED ORDER — OXYCODONE HCL 5 MG PO TABS
5.0000 mg | ORAL_TABLET | Freq: Once | ORAL | Status: AC | PRN
  Administered 2022-06-13: 5 mg via ORAL

## 2022-06-13 MED ORDER — ORAL CARE MOUTH RINSE: 15.0000 mL | Freq: Once | OROMUCOSAL | Status: AC

## 2022-06-13 MED ORDER — ACETAMINOPHEN 500 MG PO TABS
1000.0000 mg | ORAL_TABLET | Freq: Four times a day (QID) | ORAL | 0 refills | Status: DC | PRN
  Filled 2022-06-13: qty 60, 8d supply, fill #0

## 2022-06-13 MED ORDER — ACETAMINOPHEN 10 MG/ML IV SOLN: 1000.0000 mg | Freq: Once | INTRAVENOUS | Status: DC | PRN

## 2022-06-13 MED ORDER — DIPHENHYDRAMINE HCL 50 MG/ML IJ SOLN
INTRAMUSCULAR | Status: AC
  Filled 2022-06-13: qty 1

## 2022-06-13 MED ORDER — HEPARIN SODIUM (PORCINE) 5000 UNIT/ML IJ SOLN: 5000.0000 [IU] | Freq: Once | INTRAMUSCULAR | Status: AC

## 2022-06-13 MED ORDER — FENTANYL CITRATE (PF) 100 MCG/2ML IJ SOLN
25.0000 ug | INTRAMUSCULAR | Status: DC | PRN
  Administered 2022-06-13: 50 ug via INTRAVENOUS
  Administered 2022-06-13: 25 ug via INTRAVENOUS

## 2022-06-13 MED ORDER — CEFAZOLIN SODIUM-DEXTROSE 2-4 GM/100ML-% IV SOLN
2.0000 g | INTRAVENOUS | Status: AC
  Administered 2022-06-13: 2 g via INTRAVENOUS

## 2022-06-13 MED ORDER — KETAMINE HCL 50 MG/5ML IJ SOSY
PREFILLED_SYRINGE | INTRAMUSCULAR | Status: AC
  Filled 2022-06-13: qty 5

## 2022-06-13 MED ORDER — FAMOTIDINE 20 MG PO TABS
ORAL_TABLET | ORAL | Status: AC
  Filled 2022-06-13: qty 1

## 2022-06-13 MED ORDER — CHLORHEXIDINE GLUCONATE 0.12 % MT SOLN
OROMUCOSAL | Status: AC
  Administered 2022-06-13: 15 mL via OROMUCOSAL
  Filled 2022-06-13: qty 15

## 2022-06-13 MED ORDER — FENTANYL CITRATE (PF) 100 MCG/2ML IJ SOLN
INTRAMUSCULAR | Status: DC | PRN
Start: 2022-06-13 — End: 2022-06-13
  Administered 2022-06-13: 12.5 ug via INTRAVENOUS
  Administered 2022-06-13: 50 ug via INTRAVENOUS
  Administered 2022-06-13: 12.5 ug via INTRAVENOUS

## 2022-06-13 MED ORDER — KETOROLAC TROMETHAMINE 30 MG/ML IJ SOLN
INTRAMUSCULAR | Status: DC | PRN
  Administered 2022-06-13: 30 mg via INTRAVENOUS

## 2022-06-13 MED ORDER — FENTANYL CITRATE (PF) 100 MCG/2ML IJ SOLN
INTRAMUSCULAR | Status: AC
  Filled 2022-06-13: qty 2

## 2022-06-13 MED ORDER — FENTANYL CITRATE (PF) 100 MCG/2ML IJ SOLN
INTRAMUSCULAR | Status: AC
  Administered 2022-06-13: 25 ug via INTRAVENOUS
  Filled 2022-06-13: qty 2

## 2022-06-13 SURGICAL SUPPLY — 67 items
APPLICATOR SURGIFLO ENDO (HEMOSTASIS) IMPLANT
BAG URINE DRAIN 2000ML AR STRL (UROLOGICAL SUPPLIES) ×1 IMPLANT
BLADE SURG 15 STRL LF DISP TIS (BLADE) ×1 IMPLANT
BLADE SURG 15 STRL SS (BLADE) ×1
CANNULA DILATOR 5 W/SLV (CANNULA) ×1 IMPLANT
CATH FOLEY 2WAY  5CC 16FR (CATHETERS) ×1
CATH URTH 16FR FL 2W BLN LF (CATHETERS) ×1 IMPLANT
CNTNR SPEC 2.5X3XGRAD LEK (MISCELLANEOUS) ×1
CONT SPEC 4OZ STER OR WHT (MISCELLANEOUS) ×1
CONTAINER SPEC 2.5X3XGRAD LEK (MISCELLANEOUS) ×1 IMPLANT
CORD MONOPOLAR M/FML 12FT (MISCELLANEOUS) ×1 IMPLANT
COVER LIGHT HANDLE STERIS (MISCELLANEOUS) IMPLANT
DERMABOND ADVANCED (GAUZE/BANDAGES/DRESSINGS) ×1
DERMABOND ADVANCED .7 DNX12 (GAUZE/BANDAGES/DRESSINGS) ×1 IMPLANT
DEVICE SUTURE ENDOST 10MM (ENDOMECHANICALS) IMPLANT
DRAPE STERI POUCH LG 24X46 STR (DRAPES) ×2 IMPLANT
DRSG TELFA 3X4 N-ADH STERILE (GAUZE/BANDAGES/DRESSINGS) IMPLANT
GAUZE 4X4 16PLY ~~LOC~~+RFID DBL (SPONGE) ×2 IMPLANT
GLOVE BIO SURGEON STRL SZ8 (GLOVE) ×4 IMPLANT
GOWN STRL REUS W/ TWL LRG LVL3 (GOWN DISPOSABLE) ×1 IMPLANT
GOWN STRL REUS W/ TWL XL LVL3 (GOWN DISPOSABLE) ×3 IMPLANT
GOWN STRL REUS W/TWL LRG LVL3 (GOWN DISPOSABLE) ×1
GOWN STRL REUS W/TWL XL LVL3 (GOWN DISPOSABLE) ×3
GRASPER SUT TROCAR 14GX15 (MISCELLANEOUS) ×1 IMPLANT
IRRIGATION STRYKERFLOW (MISCELLANEOUS) IMPLANT
IRRIGATOR STRYKERFLOW (MISCELLANEOUS)
IV LACTATED RINGERS 1000ML (IV SOLUTION) IMPLANT
KIT IMAGING PINPOINTPAQ (MISCELLANEOUS) IMPLANT
KIT PINK PAD W/HEAD ARE REST (MISCELLANEOUS) ×1
KIT PINK PAD W/HEAD ARM REST (MISCELLANEOUS) ×1 IMPLANT
LABEL OR SOLS (LABEL) ×1 IMPLANT
LIGASURE LAP MARYLAND 5MM 37CM (ELECTROSURGICAL) IMPLANT
LIGASURE VESSEL 5MM BLUNT TIP (ELECTROSURGICAL) IMPLANT
MANIFOLD NEPTUNE II (INSTRUMENTS) ×1 IMPLANT
MANIPULATOR VCARE LG CRV RETR (MISCELLANEOUS) IMPLANT
MANIPULATOR VCARE SML CRV RETR (MISCELLANEOUS) IMPLANT
MANIPULATOR VCARE STD CRV RETR (MISCELLANEOUS) IMPLANT
NDL INSUFF ACCESS 14 VERSASTEP (NEEDLE) ×1 IMPLANT
NDL SPNL 22GX5 LNG QUINC BK (NEEDLE) IMPLANT
NEEDLE SPNL 22GX5 LNG QUINC BK (NEEDLE) IMPLANT
NS IRRIG 500ML POUR BTL (IV SOLUTION) ×1 IMPLANT
OCCLUDER COLPOPNEUMO (BALLOONS) ×1 IMPLANT
PACK GYN LAPAROSCOPIC (MISCELLANEOUS) ×1 IMPLANT
PAD OB MATERNITY 4.3X12.25 (PERSONAL CARE ITEMS) ×1 IMPLANT
PAD PREP 24X41 OB/GYN DISP (PERSONAL CARE ITEMS) ×1 IMPLANT
POUCH ENDO CATCH II 15MM (MISCELLANEOUS) IMPLANT
SET CYSTO W/LG BORE CLAMP LF (SET/KITS/TRAYS/PACK) IMPLANT
SET TUBE SMOKE EVAC HIGH FLOW (TUBING) ×1 IMPLANT
SHEARS ENDO 5MM 31CM (CUTTER) IMPLANT
SPONGE T-LAP 4X18 ~~LOC~~+RFID (SPONGE) IMPLANT
SURGIFLO W/THROMBIN 8M KIT (HEMOSTASIS) IMPLANT
SUT ENDO VLOC 180-0-8IN (SUTURE) IMPLANT
SUT MNCRL 4-0 (SUTURE) ×2
SUT MNCRL 4-0 27XMFL (SUTURE) ×2
SUT VIC AB 0 CT1 36 (SUTURE) ×1 IMPLANT
SUT VIC AB 2-0 CT1 36 (SUTURE) IMPLANT
SUT VICRYL 0 AB UR-6 (SUTURE) ×1 IMPLANT
SUT VICRYL 0 UR6 27IN ABS (SUTURE) IMPLANT
SUTURE MNCRL 4-0 27XMF (SUTURE) ×2 IMPLANT
SYR 10ML LL (SYRINGE) ×1 IMPLANT
SYR 3ML LL SCALE MARK (SYRINGE) IMPLANT
SYR 50ML LL SCALE MARK (SYRINGE) ×1 IMPLANT
TRAP FLUID SMOKE EVACUATOR (MISCELLANEOUS) ×1 IMPLANT
TROCAR BLUNT TIP 12MM OMST12BT (TROCAR) ×1 IMPLANT
TROCAR VERSASTEP PLUS 12MM (TROCAR) ×1 IMPLANT
TROCAR VERSASTEP PLUS 5MM (TROCAR) ×1 IMPLANT
WATER STERILE IRR 500ML POUR (IV SOLUTION) ×1 IMPLANT

## 2022-06-13 NOTE — Op Note (Addendum)
Operative Note   06/13/2022 10:13 AM  PRE-OP DIAGNOSIS: Granulosa cell tumor    POST-OP DIAGNOSIS: same  SURGEON: Surgeon(s) and Role:    Mellody Drown, MD - Primary    * Rubie Maid, MD  ANESTHESIA: General   PROCEDURE: HYSTERECTOMY TOTAL LAPAROSCOPIC, LEFT SALPINGO- OOPHORECTOMY, PERITONEAL BIOPSIES, AND OMENTECTOMY   ESTIMATED BLOOD LOSS: Minimal  DRAINS: none   TOTAL IV FLUIDS: per Anesth  SPECIMENS: uterus, left tube and ovary, peritoneal biopsies, omental biopsy  COMPLICATIONS: none  DISPOSITION: PACU - hemodynamically stable.  CONDITION: stable  INDICATIONS: Prior emergency removal of right adnexa for ruptured granulosa cell tumor of right ovary.  FINDINGS: Normal uterus except a few small subserosal fibroids, right adnexa and no abnormalities in the pelvis or upper abdomen.    PROCEDURE IN DETAIL: After informed consent was obtained, the patient was taken to the operating room where anesthesia was obtained without difficulty. The patient was positioned in the dorsal lithotomy position in [place] and her arms were carefully tucked at her sides and the usual precautions were taken.  She was prepped and draped in normal sterile fashion.  Time-out was performed and a Foley catheter was placed into the bladder and a standard VCare uterine manipulator was placed in the uterus without incident.    An open Hasson technique was used to place an infraumbilical 14-ER baloon trocar under direct visualization. The laparoscope was introduced and CO2 gas was infused for pneumoperitoneum to a pressure of 15 mm Hg.  Right and left lateral 5-mm ports and a 5-12 mm suprapubic port were placed under direct visualization of the laparoscope using an EndoStep technique.  Cytologic washings were obtained.  The patient was placed in Trendelenburg and the bowel was displaced up into the upper abdomen.    Round ligaments were divided on each side with the EndoShears and the  retroperitoneal space was opened bilaterally.  The ureters were identified and preserved.  The left infundibulopelvic ligament was skeletonized, sealed and divided with the LigaSure device.  A bladder flap was created and the bladder was dissected down off the lower uterine segment and cervix using endoshears and electrocautery.  The uterine arteries were skeletonized bilaterally, sealed and divided with the LigaSure device.  A colpotomy was performed circumferentially along the V-Care ring with electrocautery and the cervix was incised from the vagina and the specimen was removed through the vagina.  A pneumo balloon was placed in the vagina and the vaginal cuff was then closed in a running continuous fashion using the EndoStitch technique with 0 V-Lock suture with careful attention to include the vaginal cuff angles and the vaginal mucosa within the closure.  A few 2-0 Vicryl sutures were placed in the cuff vaginally due to air leak. Biopsies done of gutter peritoneum and omentum.   Hemostasis was observed. The intraperitoneal pressure was dropped, and all planes of dissection, vascular pedicles and the vaginal cuff were found to be hemostatic.  The suprapubic trocar was removed and the fascia was closed with 0 Vicryl suture using the Endoclose technique. The lateral trocars were removed under visualization.   Before the umbilical trocar was removed the CO2 gas was released.  The fascia there was closed with 0 Vicryl suture in interrupted technique.   The skin incisions were closed with subcuticular stitches and glue.  The patient tolerated the procedure well.  Sponge, lap and needle counts were correct x2.  The patient was taken to recovery room in excellent condition.  Surgery required a  high-level surgical assistant with none other readily available. Dr Marcelline Mates assisted me with the entire procedure.    Antibiotics: {Blank single:19197::"Given 1st or 2nd generation cephalosporin, Antibiotics given within 1  hour of the start of the procedure, Antibiotics ordered to be discontinued within 24 hours post procedure"   VTE prophylaxis: was ordered perioperatively with IPCs and SQ heparin.  Mellody Drown, MD

## 2022-06-13 NOTE — H&P (Signed)
Patient seen and examined. No change in plan for TLH/USO, staging biopsies for ruptured granulosa cell tumor.  Patient has mild genital herpes outbreak.  Has these occasionally, but does not have systemic symptoms. Mellody Drown, MD

## 2022-06-13 NOTE — Anesthesia Preprocedure Evaluation (Addendum)
Anesthesia Evaluation  Patient identified by MRN, date of birth, ID band Patient awake    Reviewed: Allergy & Precautions, NPO status , Patient's Chart, lab work & pertinent test results  History of Anesthesia Complications Negative for: history of anesthetic complications  Airway Mallampati: III   Neck ROM: Full    Dental no notable dental hx.    Pulmonary sleep apnea and Continuous Positive Airway Pressure Ventilation ,    Pulmonary exam normal breath sounds clear to auscultation       Cardiovascular Exercise Tolerance: Good negative cardio ROS Normal cardiovascular exam Rhythm:Regular Rate:Normal     Neuro/Psych  Headaches,    GI/Hepatic GERD  Controlled and Medicated,  Endo/Other  negative endocrine ROS  Renal/GU negative Renal ROS     Musculoskeletal   Abdominal Normal abdominal exam  (+)   Peds  Hematology  (+) Blood dyscrasia, anemia ,   Anesthesia Other Findings Granulosa cell tumor  Hypokalemia 3.3 Anemia Thrombocytosis  Reproductive/Obstetrics                            Anesthesia Physical  Anesthesia Plan  ASA: 2  Anesthesia Plan: General   Post-op Pain Management: Tylenol PO (pre-op)* and Toradol IV (intra-op)*   Induction: Intravenous  PONV Risk Score and Plan: 4 or greater and Ondansetron, Dexamethasone, Midazolam and Droperidol  Airway Management Planned: Oral ETT  Additional Equipment:   Intra-op Plan:   Post-operative Plan: Extubation in OR  Informed Consent: I have reviewed the patients History and Physical, chart, labs and discussed the procedure including the risks, benefits and alternatives for the proposed anesthesia with the patient or authorized representative who has indicated his/her understanding and acceptance.     Dental advisory given  Plan Discussed with: CRNA  Anesthesia Plan Comments: (Patient consented for risks of anesthesia  including but not limited to:  - adverse reactions to medications - damage to eyes, teeth, lips or other oral mucosa - nerve damage due to positioning  - sore throat or hoarseness - damage to heart, brain, nerves, lungs, other parts of body or loss of life  Informed patient about role of CRNA in peri- and intra-operative care.  Patient voiced understanding.)      Anesthesia Quick Evaluation

## 2022-06-13 NOTE — Anesthesia Procedure Notes (Addendum)
Procedure Name: Intubation Date/Time: 06/13/2022 7:44 AM  Performed by: Lia Foyer, CRNAPre-anesthesia Checklist: Patient identified, Emergency Drugs available, Suction available and Patient being monitored Patient Re-evaluated:Patient Re-evaluated prior to induction Oxygen Delivery Method: Circle system utilized Preoxygenation: Pre-oxygenation with 100% oxygen Induction Type: IV induction Ventilation: Mask ventilation without difficulty Laryngoscope Size: McGraph and 3 Grade View: Grade I Tube type: Oral Tube size: 6.5 mm Number of attempts: 1 Airway Equipment and Method: Stylet and Video-laryngoscopy Placement Confirmation: ETT inserted through vocal cords under direct vision, positive ETCO2 and breath sounds checked- equal and bilateral Secured at: 19 cm Tube secured with: Tape Dental Injury: Teeth and Oropharynx as per pre-operative assessment

## 2022-06-13 NOTE — Discharge Instructions (Signed)

## 2022-06-13 NOTE — Transfer of Care (Signed)
Immediate Anesthesia Transfer of Care Note  Patient: Lori Beltran  Procedure(s) Performed: HYSTERECTOMY TOTAL LAPAROSCOPIC, LEFT SALPINGO- OOPHORECTOMY, PERITONEAL BIOPSIES, AND OMENTECTOMY (Abdomen)  Patient Location: PACU  Anesthesia Type:General  Level of Consciousness: drowsy  Airway & Oxygen Therapy: Patient Spontanous Breathing and Patient connected to face mask oxygen  Post-op Assessment: Report given to RN and Post -op Vital signs reviewed and stable  Post vital signs: Reviewed and stable  Last Vitals:  Vitals Value Taken Time  BP 144/80 06/13/22 1020  Temp 36 C 06/13/22 1020  Pulse 64 06/13/22 1023  Resp 18 06/13/22 1023  SpO2 100 % 06/13/22 1023  Vitals shown include unvalidated device data.  Last Pain:  Vitals:   06/13/22 0630  PainSc: 0-No pain         Complications: No notable events documented.

## 2022-06-13 NOTE — Anesthesia Postprocedure Evaluation (Signed)
Anesthesia Post Note  Patient: SABRIAH HOBBINS  Procedure(s) Performed: HYSTERECTOMY TOTAL LAPAROSCOPIC, LEFT SALPINGO- OOPHORECTOMY, PERITONEAL BIOPSIES, AND OMENTECTOMY (Abdomen)  Patient location during evaluation: PACU Anesthesia Type: General Level of consciousness: awake and alert, oriented and patient cooperative Pain management: pain level controlled Vital Signs Assessment: post-procedure vital signs reviewed and stable Respiratory status: spontaneous breathing, nonlabored ventilation and respiratory function stable Cardiovascular status: blood pressure returned to baseline and stable Postop Assessment: adequate PO intake Anesthetic complications: no   No notable events documented.   Last Vitals:  Vitals:   06/13/22 1100 06/13/22 1126  BP: 100/73 103/72  Pulse: 65 (!) 57  Resp: 16 16  Temp: (!) 36.2 C 36.5 C  SpO2: 99% 100%    Last Pain:  Vitals:   06/13/22 1126  TempSrc: Temporal  PainSc: 0-No pain                 Darrin Nipper

## 2022-06-14 LAB — SURGICAL PATHOLOGY

## 2022-06-14 LAB — CYTOLOGY - NON PAP

## 2022-06-20 ENCOUNTER — Other Ambulatory Visit: Payer: Self-pay

## 2022-06-20 ENCOUNTER — Encounter: Payer: Self-pay | Admitting: Nurse Practitioner

## 2022-06-20 ENCOUNTER — Telehealth (INDEPENDENT_AMBULATORY_CARE_PROVIDER_SITE_OTHER): Payer: No Typology Code available for payment source | Admitting: Nurse Practitioner

## 2022-06-20 DIAGNOSIS — R309 Painful micturition, unspecified: Secondary | ICD-10-CM

## 2022-06-20 LAB — POCT URINALYSIS DIPSTICK
Bilirubin, UA: NEGATIVE
Glucose, UA: NEGATIVE
Ketones, UA: NEGATIVE
Nitrite, UA: NEGATIVE
Protein, UA: POSITIVE — AB
Spec Grav, UA: 1.02 (ref 1.010–1.025)
Urobilinogen, UA: NEGATIVE E.U./dL — AB
pH, UA: 6 (ref 5.0–8.0)

## 2022-06-20 MED ORDER — SULFAMETHOXAZOLE-TRIMETHOPRIM 800-160 MG PO TABS
1.0000 | ORAL_TABLET | Freq: Two times a day (BID) | ORAL | 0 refills | Status: AC
  Filled 2022-06-20: qty 10, 5d supply, fill #0

## 2022-06-20 NOTE — Progress Notes (Signed)
Name: Lori Beltran   MRN: [id number]    DOB: 1976/01/16   Date:06/20/2022       Progress Note  Subjective  Chief Complaint  Chief Complaint  Patient presents with   Urinary Tract Infection    Hurts to urinate, pain and right lower abdomen    I connected with  Lori Beltran  on 06/20/22 at 11:40 AM EDT by a video enabled telemedicine application and verified that I am speaking with the correct person using two identifiers.  I discussed the limitations of evaluation and management by telemedicine and the availability of in person appointments. The patient expressed understanding and agreed to proceed with a virtual visit  Staff also discussed with the patient that there may be a patient responsible charge related to this service. Patient Location: home Provider Location: [facility] Additional Individuals present: alone  HPI  Dysuria: She says she feels like she has a spasm in her right lower quadrant.  She says that it hurts more when she urinates.  She says she does have dysuria.  She denies any fever or back pain.  She did recently have surgery on 06/13/2022, hysterectomy with oophorectomy by Dr. Fransisca Connors.  Discussed her urine dip showed trace blood, positive protein, negative nitrites, trace leukocytes.  We will start patient on antibiotics and send urine for culture.  Also recommended patient reach out to surgeon and let him know about her pain.  Discussed with patient if any worsening symptoms or fever seek emergency care.  Patient Active Problem List   Diagnosis Date Noted   Granulosa cell tumor of ovary, 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   Hemoperitoneum 03/29/2022   History of left knee surgery 03/08/2022   Pre-diabetes 03/08/2022   History of obesity 03/08/2022   Overweight (BMI 25.0-29.9) 03/08/2022   OSA on CPAP 07/20/2021   Primary osteoarthritis of both knees 01/31/2021   Angioedema 07/04/2018   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/2015    Chronic constipation 07/15/2015   History of iron deficiency anemia 07/15/2015   Migraine without aura and without status migrainosus, not intractable 05/04/2015   Environmental and seasonal allergies 05/04/2015   Plantar fasciitis of left foot 05/04/2015   Vitamin D deficiency 05/04/2015   GERD (gastroesophageal reflux disease) 05/04/2015    Social History   Tobacco Use   Smoking status: Never   Smokeless tobacco: Never  Substance Use Topics   Alcohol use: No    Alcohol/week: 0.0 standard drinks of alcohol     Current Outpatient Medications:    acetaminophen (TYLENOL) 500 MG tablet, Take 2 tablets (1,000 mg total) by mouth every 6 (six) hours as needed., Disp: 60 tablet, Rfl: 0   ibuprofen (ADVIL) 600 MG tablet, Take 1 tablet (600 mg total) by mouth every 6 (six) hours as needed for mild pain., Disp: 60 tablet, Rfl: 1   omeprazole (PRILOSEC) 40 MG capsule, TAKE 1 CAPSULE (40 MG TOTAL) BY MOUTH DAILY., Disp: 30 capsule, Rfl: 5   oxyCODONE (ROXICODONE) 5 MG immediate release tablet, Take 1-2 tablets (5-10 mg total) by mouth every 6 (six) hours as needed., Disp: 20 tablet, Rfl: 0   Semaglutide-Weight Management (WEGOVY) 1.7 MG/0.75ML SOAJ, Inject 1.7 mg into the skin once a week., Disp: 3 mL, Rfl: 2   valACYclovir (VALTREX) 500 MG tablet, TAKE 1 TABLET BY MOUTH TWICE DAILY FOR 3 DAYS AS NEEDED FOR SYMPTOMS, Disp: 30 tablet, Rfl: 1   docusate sodium (COLACE) 100 MG capsule, Take  1 capsule (100 mg total) by mouth 2 (two) times daily as needed for mild constipation. (Patient not taking: Reported on 05/02/2022), Disp: 30 capsule, Rfl: 0   EPINEPHrine 0.3 mg/0.3 mL IJ SOAJ injection, Inject 0.3 mg into the muscle as needed for anaphylaxis. (Patient not taking: Reported on 05/02/2022), Disp: 1 each, Rfl: 1   simethicone (GAS-X) 80 MG chewable tablet, Chew 1 tablet (80 mg total) by mouth 4 (four) times daily as needed for flatulence. (Patient not taking: Reported on 04/18/2022), Disp: 60 tablet,  Rfl: 0  Allergies  Allergen Reactions   Peanut Butter Flavor Itching and Swelling   Shellfish Allergy Itching and Swelling    I personally reviewed active problem list, medication list, allergies, notes from last encounter with the patient/caregiver today.  ROS  Constitutional: Negative for fever or weight change.  Respiratory: Negative for cough and shortness of breath.   Cardiovascular: Negative for chest pain or palpitations.  Gastrointestinal: Negative for abdominal pain, no bowel changes.  GU: positive for dysuria Musculoskeletal: Negative for gait problem or joint swelling.  Skin: Negative for rash.  Neurological: Negative for dizziness or headache.  No other specific complaints in a complete review of systems (except as listed in HPI above).   Objective  Virtual encounter, vitals not obtained.  There is no height or weight on file to calculate BMI.  Nursing Note and Vital Signs reviewed.  Physical Exam  Awake, alert and oriented, speaking in complete sentences  Results for orders placed or performed in visit on 06/20/22 (from the past 72 hour(s))  POCT urinalysis dipstick     Status: Abnormal   Collection Time: 06/20/22  1:05 PM  Result Value Ref Range   Color, UA gold    Clarity, UA clear    Glucose, UA Negative Negative   Bilirubin, UA negative    Ketones, UA negative    Spec Grav, UA 1.020 1.010 - 1.025   Blood, UA trace    pH, UA 6.0 5.0 - 8.0   Protein, UA Positive (A) Negative   Urobilinogen, UA negative (A) 0.2 or 1.0 E.U./dL   Nitrite, UA negative    Leukocytes, UA Trace (A) Negative   Appearance clear    Odor strong     Assessment & Plan   Problem List Items Addressed This Visit   None Visit Diagnoses     Painful urination    -  Primary   We will get urine dip, urine culture.  Start patient on Bactrim.  Recommend patient reach out to surgeon and notify them of pain.   Relevant Medications   sulfamethoxazole-trimethoprim (BACTRIM DS)  800-160 MG tablet   Other Relevant Orders   POCT urinalysis dipstick (Completed)   Urine Culture        -Red flags and when to present for emergency care or RTC including fever >101.33F, chest pain, shortness of breath, new/worsening/un-resolving symptoms,  reviewed with patient at time of visit. Follow up and care instructions discussed and provided in AVS. - I discussed the assessment and treatment plan with the patient. The patient was provided an opportunity to ask questions and all were answered. The patient agreed with the plan and demonstrated an understanding of the instructions.  I provided 15 minutes of non-face-to-face time during this encounter.  Bo Merino, FNP

## 2022-06-21 LAB — URINE CULTURE
MICRO NUMBER:: 13853880
SPECIMEN QUALITY:: ADEQUATE

## 2022-06-22 ENCOUNTER — Other Ambulatory Visit: Payer: Self-pay

## 2022-06-22 ENCOUNTER — Encounter: Payer: Self-pay | Admitting: Obstetrics and Gynecology

## 2022-06-22 ENCOUNTER — Other Ambulatory Visit: Payer: Self-pay | Admitting: Family Medicine

## 2022-06-22 ENCOUNTER — Encounter: Payer: Self-pay | Admitting: Nurse Practitioner

## 2022-06-22 DIAGNOSIS — Z8639 Personal history of other endocrine, nutritional and metabolic disease: Secondary | ICD-10-CM

## 2022-06-22 DIAGNOSIS — E663 Overweight: Secondary | ICD-10-CM

## 2022-06-22 MED ORDER — WEGOVY 1.7 MG/0.75ML ~~LOC~~ SOAJ
1.7000 mg | SUBCUTANEOUS | 0 refills | Status: DC
  Filled 2022-06-22: qty 3, 28d supply, fill #0

## 2022-06-26 ENCOUNTER — Other Ambulatory Visit: Payer: Self-pay

## 2022-06-26 ENCOUNTER — Telehealth: Payer: Self-pay

## 2022-06-26 ENCOUNTER — Inpatient Hospital Stay
Payer: No Typology Code available for payment source | Attending: Obstetrics and Gynecology | Admitting: Nurse Practitioner

## 2022-06-26 VITALS — BP 99/78 | HR 74 | Temp 97.7°F | Resp 16

## 2022-06-26 DIAGNOSIS — R1031 Right lower quadrant pain: Secondary | ICD-10-CM | POA: Diagnosis not present

## 2022-06-26 DIAGNOSIS — G8918 Other acute postprocedural pain: Secondary | ICD-10-CM | POA: Diagnosis not present

## 2022-06-26 DIAGNOSIS — R300 Dysuria: Secondary | ICD-10-CM | POA: Insufficient documentation

## 2022-06-26 DIAGNOSIS — E669 Obesity, unspecified: Secondary | ICD-10-CM | POA: Diagnosis not present

## 2022-06-26 DIAGNOSIS — D3911 Neoplasm of uncertain behavior of right ovary: Secondary | ICD-10-CM | POA: Insufficient documentation

## 2022-06-26 LAB — URINALYSIS, COMPLETE (UACMP) WITH MICROSCOPIC
Bilirubin Urine: NEGATIVE
Glucose, UA: NEGATIVE mg/dL
Hgb urine dipstick: NEGATIVE
Ketones, ur: NEGATIVE mg/dL
Leukocytes,Ua: NEGATIVE
Nitrite: NEGATIVE
Protein, ur: NEGATIVE mg/dL
Specific Gravity, Urine: 1.025 (ref 1.005–1.030)
pH: 5 (ref 5.0–8.0)

## 2022-06-26 NOTE — Telephone Encounter (Signed)
Received call from Lori Beltran regarding post operative issue. She had surgery 06/13/22 and is reporting painful right sided abdominal pain that escalates with movement/walking only a few steps. She reports painful urination and contacted her PCP for which a UTI was ruled out. She was empirically treated with Sulfamethoxazole-trimethoprim 800/160. Having bowel movement every other day with no changes and continues taking her colace. She is supposed to return to work on Monday and does not feel with the pain she can do so. She has contacted Dr. Andreas Blower office on 9/1 and has not heard back from them. Will check and see if she can be seen by our office today.

## 2022-06-26 NOTE — Telephone Encounter (Signed)
Spoke to Lori Beltran and she will be seen today at 1330.

## 2022-06-26 NOTE — Progress Notes (Signed)
Sympt[facility] [place] [facility] [place]. [facility] [address], [place], [place] [address] [phone] (phone) [phone] (fax)  Patient Care Team: Steele Sizer, MD as PCP - General (Family Medicine) Clent Jacks, RN as Oncology Nurse Navigator   Name of the patient: Lori Beltran  [id number]  1976-02-18   Date of visit: 06/26/22  Diagnosis- Granulosa Cell Tumor  Chief complaint/ Reason for visit- Pelvic pain  Heme/Onc history:  Lori Beltran is a 46 y.o. 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  No other disease noted in the abdomen.  Oncology History   No history exists.    Interval history- Patient is 46 year old female diagnosed with granulosa cell tumor now status post TLH LSO with biopsies and omentectomy with Dr. Marcelline Mates and Dr. Fransisca Connors on 06/13/2022 at [facility] who presents to symptom management clinic for postsurgical pain.  She is concerned about returning to work doing to ongoing fatigue and pain.  She requires assistance with her ADLs.  Is not able to sit for extended periods. No fevers or chills.  No discharge or bleeding.  Review of systems- Review of Systems  Constitutional:  Positive for malaise/fatigue. Negative for fever.  Respiratory:  Negative for cough.   Cardiovascular:  Negative for chest pain and palpitations.  Gastrointestinal:  Positive for abdominal pain. Negative for blood in stool, constipation, diarrhea, heartburn, nausea and vomiting.  Genitourinary:  Negative for dysuria, frequency and urgency.  Neurological:  Negative for headaches.  Psychiatric/Behavioral:  Negative for depression. The patient is not nervous/anxious.       Allergies  Allergen Reactions   Peanut Butter Flavor Itching and Swelling   Shellfish Allergy Itching and Swelling    Past  Medical History:  Diagnosis Date   Arthritis    left knee   Cervical dysplasia    CINII   Constipation    GERD (gastroesophageal reflux disease)    Herpes    Migraine    Obesity    Pap smear abnormality of vagina with ASC-US    Plantar fasciitis    Pre-diabetes    Sleep apnea    Snoring    Vitamin D deficiency     Past Surgical History:  Procedure Laterality Date   COLONOSCOPY WITH PROPOFOL N/A 12/30/2020   Procedure: COLONOSCOPY WITH PROPOFOL;  Surgeon: Jonathon Bellows, MD;  Location: [facility];  Service: Gastroenterology;  Laterality: N/A;   COLPOSCOPY     ENDOMETRIAL ABLATION  2011   KNEE ARTHROSCOPY Left 11/23/2021   Procedure: ARTHROSCOPY KNEE, CHONDROPLASTY OF PATELLORFEMORAL JOINT, REMOVAL OF LOOSE BODIES;  Surgeon: Thornton Park, MD;  Location: [facility] ORS;  Service: Orthopedics;  Laterality: Left;   LAPAROSCOPIC HYSTERECTOMY N/A 06/13/2022   Procedure: HYSTERECTOMY TOTAL LAPAROSCOPIC, LEFT SALPINGO- OOPHORECTOMY, PERITONEAL BIOPSIES, AND OMENTECTOMY;  Surgeon: Mellody Drown, MD;  Location: [facility] ORS;  Service: Gynecology;  Laterality: N/A;   LAPAROSCOPY Right 03/29/2022   Procedure: LAPAROSCOPIC RIGHT SALPINGO-OOPHORECTOMY EVACUATION OF HEMOPERITONEUM ;  Surgeon: Rubie Maid, MD;  Location: [facility] ORS;  Service: Gynecology;  Laterality: Right;   LEEP     TUBAL LIGATION  2000    Social History   Socioeconomic History   Marital status: Married    Spouse name: Gerald Stabs    Number of children: 3   Years of education: Not on file   Highest education level: Associate degree: occupational, Hotel manager,  or vocational program  Occupational History   Not on file  Tobacco Use   Smoking status: Never   Smokeless tobacco: Never  Vaping Use   Vaping Use: Never used  Substance and Sexual Activity   Alcohol use: No    Alcohol/week: 0.0 standard drinks of alcohol   Drug use: No   Sexual activity: Yes    Partners: Male    Birth control/protection: Other-see comments, Surgical     Comment: TL  Other Topics Concern   Not on file  Social History Narrative   Not on file   Social Determinants of Health   Financial Resource Strain: Low Risk  (02/09/2022)   Overall Financial Resource Strain (CARDIA)    Difficulty of Paying Living Expenses: Not hard at all  Food Insecurity: No Food Insecurity (02/09/2022)   Hunger Vital Sign    Worried About Running Out of Food in the Last Year: Never true    Ran Out of Food in the Last Year: Never true  Transportation Needs: No Transportation Needs (02/09/2022)   PRAPARE - Hydrologist (Medical): No    Lack of Transportation (Non-Medical): No  Physical Activity: Sufficiently Active (02/09/2022)   Exercise Vital Sign    Days of Exercise per Week: 5 days    Minutes of Exercise per Session: 30 min  Stress: No Stress Concern Present (02/09/2022)   [place]    Feeling of Stress : Not at all  Social Connections: Moderately Integrated (02/09/2022)   Social Connection and Isolation Panel [NHANES]    Frequency of Communication with Friends and Family: More than three times a week    Frequency of Social Gatherings with Friends and Family: More than three times a week    Attends Religious Services: More than 4 times per year    Active Member of [employer] or Organizations: No    Attends Archivist Meetings: Never    Marital Status: Married  Human resources officer Violence: Not At Risk (02/09/2022)   Humiliation, Afraid, Rape, and Kick questionnaire    Fear of Current or Ex-Partner: No    Emotionally Abused: No    Physically Abused: No    Sexually Abused: No    Family History  Problem Relation Age of Onset   Sleep apnea Mother    Sleep apnea Father    Allergic rhinitis Daughter    Allergic rhinitis Son    Breast cancer Paternal Grandmother 54   Brain cancer Paternal Grandmother    Breast cancer Maternal Aunt 80   Colon cancer Maternal  Uncle    Lung cancer Maternal Uncle    Prostate cancer Maternal Uncle    Bone cancer Maternal Aunt      Current Outpatient Medications:    acetaminophen (TYLENOL) 500 MG tablet, Take 2 tablets (1,000 mg total) by mouth every 6 (six) hours as needed., Disp: 60 tablet, Rfl: 0   docusate sodium (COLACE) 100 MG capsule, Take 1 capsule (100 mg total) by mouth 2 (two) times daily as needed for mild constipation. (Patient not taking: Reported on 05/02/2022), Disp: 30 capsule, Rfl: 0   EPINEPHrine 0.3 mg/0.3 mL IJ SOAJ injection, Inject 0.3 mg into the muscle as needed for anaphylaxis. (Patient not taking: Reported on 05/02/2022), Disp: 1 each, Rfl: 1   ibuprofen (ADVIL) 600 MG tablet, Take 1 tablet (600 mg total) by mouth every 6 (six) hours as needed for mild pain. (Patient not taking: Reported  on 06/26/2022), Disp: 60 tablet, Rfl: 1   omeprazole (PRILOSEC) 40 MG capsule, TAKE 1 CAPSULE (40 MG TOTAL) BY MOUTH DAILY., Disp: 30 capsule, Rfl: 5   oxyCODONE (ROXICODONE) 5 MG immediate release tablet, Take 1-2 tablets (5-10 mg total) by mouth every 6 (six) hours as needed. (Patient not taking: Reported on 06/26/2022), Disp: 20 tablet, Rfl: 0   Semaglutide-Weight Management (WEGOVY) 1.7 MG/0.75ML SOAJ, Inject 1.7 mg into the skin once a week., Disp: 3 mL, Rfl: 0   simethicone (GAS-X) 80 MG chewable tablet, Chew 1 tablet (80 mg total) by mouth 4 (four) times daily as needed for flatulence. (Patient not taking: Reported on 04/18/2022), Disp: 60 tablet, Rfl: 0   valACYclovir (VALTREX) 500 MG tablet, TAKE 1 TABLET BY MOUTH TWICE DAILY FOR 3 DAYS AS NEEDED FOR SYMPTOMS, Disp: 30 tablet, Rfl: 1  Physical exam:  Vitals:   06/26/22 1357  BP: 99/78  Pulse: 74  Resp: 16  Temp: 97.7 F (36.5 C)  TempSrc: Tympanic  SpO2: 100%   Physical Exam Constitutional:      General: She is not in acute distress.    Appearance: She is well-developed. She is not ill-appearing.     Comments: Fatigued appearing. Mother  accompanies patient.   Eyes:     General: No scleral icterus. Cardiovascular:     Rate and Rhythm: Normal rate and regular rhythm.  Pulmonary:     Effort: Pulmonary effort is normal.     Breath sounds: Normal breath sounds.  Abdominal:     Tenderness: There is abdominal tenderness.     Hernia: No hernia is present.     Comments: Surgical wounds are healing well.  Too soon for pelvic exam.  Musculoskeletal:        General: No tenderness or deformity.     Right lower leg: No edema.     Left lower leg: No edema.  Skin:    General: Skin is warm and dry.     Coloration: Skin is not pale.  Neurological:     Mental Status: She is alert and oriented to person, place, and time.  Psychiatric:        Mood and Affect: Mood normal.        Behavior: Behavior normal.         Latest Ref Rng & Units 06/05/2022    1:50 PM  CMP  Glucose 70 - 99 mg/dL 99   BUN 6 - 20 mg/dL 18   Creatinine 0.44 - 1.00 mg/dL 0.74   Sodium 135 - 145 mmol/L 139   Potassium 3.5 - 5.1 mmol/L 3.3   Chloride 98 - 111 mmol/L 109   CO2 22 - 32 mmol/L 24   Calcium 8.9 - 10.3 mg/dL 8.9   Total Protein 6.5 - 8.1 g/dL 7.1   Total Bilirubin 0.3 - 1.2 mg/dL 0.3   Alkaline Phos 38 - 126 U/L 75   AST 15 - 41 U/L 13   ALT 0 - 44 U/L 14       Latest Ref Rng & Units 06/05/2022    1:50 PM  CBC  WBC 4.0 - 10.5 K/uL 6.4   Hemoglobin 12.0 - 15.0 g/dL 10.9   Hematocrit 36.0 - 46.0 % 34.8   Platelets 150 - 400 K/uL 410     No images are attached to the encounter.  No results found.  Assessment and plan- Patient is a 46 y.o. female s/p TLH- LSO with peritoneal biopsies and omentectomy on 06/13/22  with Dr. Fransisca Connors and Dr. Marcelline Mates for granulosa cell tumor  of right ovary. She previously underwent emergency removal of right adnexa for ruptured granulosa cell tumor of right ovary. Pathology for completion surgery and staging was benign and washings were negative. She presents to clinic for pelvic pain  Pelvic pain- right  sided. Empirically treated with bactrim for possible UTI though I suspect she is experiencing a normal postoperative pain.  Too soon for patient to return to work and I recommend a full 6 weeks for recovery and we can readdress at her postop appointment.  Plan to do pelvic exam at that appointment to assess vaginal cuff.  Continue pelvic rest.  Return to clinic in the interim as needed.  Visit Diagnosis 1. Granulosa cell tumor of ovary, right   2. Dysuria   3. RLQ abdominal pain   4. Post-op pain     Patient expressed understanding and was in agreement with this plan. She also understands that She can call clinic at any time with any questions, concerns, or complaints.   Thank you for allowing me to participate in the care of this very pleasant patient.   Beckey Rutter, DNP, AGNP-[facility]

## 2022-06-26 NOTE — Progress Notes (Signed)
Pt add on to [facility] for right lower abdominal pain and pain with urination. Started abt for possible UTI, but no improvement in symptoms. Surgery 8/23.

## 2022-06-27 ENCOUNTER — Encounter: Payer: Self-pay | Admitting: Nurse Practitioner

## 2022-06-27 LAB — URINE CULTURE: Culture: NO GROWTH

## 2022-06-29 ENCOUNTER — Ambulatory Visit (INDEPENDENT_AMBULATORY_CARE_PROVIDER_SITE_OTHER): Payer: No Typology Code available for payment source | Admitting: Obstetrics and Gynecology

## 2022-06-29 ENCOUNTER — Encounter: Payer: Self-pay | Admitting: Obstetrics and Gynecology

## 2022-06-29 VITALS — BP 106/75 | HR 85 | Ht 62.0 in | Wt 143.9 lb

## 2022-06-29 DIAGNOSIS — R300 Dysuria: Secondary | ICD-10-CM | POA: Diagnosis not present

## 2022-06-29 DIAGNOSIS — Z90710 Acquired absence of both cervix and uterus: Secondary | ICD-10-CM

## 2022-06-29 DIAGNOSIS — Z09 Encounter for follow-up examination after completed treatment for conditions other than malignant neoplasm: Secondary | ICD-10-CM

## 2022-06-29 LAB — POCT URINALYSIS DIPSTICK
Bilirubin, UA: NEGATIVE
Glucose, UA: NEGATIVE
Ketones, UA: NEGATIVE
Leukocytes, UA: NEGATIVE
Nitrite, UA: NEGATIVE
Protein, UA: NEGATIVE
Spec Grav, UA: 1.015 (ref 1.010–1.025)
Urobilinogen, UA: 0.2 E.U./dL
pH, UA: 6 (ref 5.0–8.0)

## 2022-06-29 NOTE — Progress Notes (Signed)
OBSTETRICS/GYNECOLOGY POST-OPERATIVE CLINIC VISIT  Subjective:     Lori Beltran is a 46 y.o. 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 Eating a regular diet without difficulty. Bowel movements are normal. Pain is controlled with current analgesics. Medications being used: prescription NSAID's including ibuprofen (Motrin).    Still noting some urinary discomfort. Seen by PCP last week who did a urinalysis and culture which were negative.   The following portions of the patient's history were reviewed and updated as appropriate: allergies, current medications, past family history, past medical history, past social history, past surgical history, and problem list.  Review of Systems Pertinent items noted in HPI and remainder of comprehensive ROS otherwise negative.   Objective:   There were no vitals taken for this visit. There is no height or weight on file to calculate BMI.  General:  alert and no distress  Abdomen: soft, bowel sounds active, non-tender  Incision:   healing well, no drainage, no erythema, no hernia, no seroma, no swelling, no dehiscence, incision well approximated    Pathology:   DIAGNOSIS:  A. UTERUS WITH CERVIX, LEFT FALLOPIAN TUBE AND OVARY; TOTAL HYSTERECTOMY  WITH LEFT SALPINGO-OOPHORECTOMY:  - UTERINE CERVIX:       - BENIGN TRANSFORMATION ZONE.       - NEGATIVE FOR SQUAMOUS INTRAEPITHELIAL LESION AND MALIGNANCY.  - ENDOMETRIUM:       - BENIGN ENDOMETRIAL POLYP.       - BACKGROUND MILDLY DISORDERED PROLIFERATIVE ENDOMETRIUM.       - NEGATIVE FOR ATYPICAL HYPERPLASIA/EIN AND MALIGNANCY.  - MYOMETRIUM:       - LEIOMYOMATA UTERI.       - NEGATIVE FOR FEATURES OF MALIGNANCY.  - UTERINE SEROSA:       - MILD LYMPHANGIECTASIA; OTHERWISE NO SIGNIFICANT HISTOPATHOLOGIC  CHANGE.  - FALLOPIAN TUBE:       - NO  SIGNIFICANT HISTOPATHOLOGIC CHANGE.  - OVARY:       - BENIGN PHYSIOLOGIC CHANGES.       - NEGATIVE FOR MALIGNANCY.   B. OMENTUM; OMENTECTOMY:  - BENIGN FIBROADIPOSE OMENTAL TISSUE.  - NEGATIVE FOR ATYPIA AND MALIGNANCY.   C. SOFT TISSUE, RIGHT PARACOLIC GUTTER PERITONEUM; BIOPSY:  - BENIGN FIBROADIPOSE AND FIBROMEMBRANOUS TISSUE.  - NEGATIVE FOR ATYPIA AND MALIGNANCY   D. SOFT TISSUE, LEFT PARACOLIC GUTTER PERITONEUM; BIOPSY:  - BENIGN FIBROADIPOSE AND FIBROMEMBRANOUS TISSUE.  - NEGATIVE FOR ATYPIA AND MALIGNANCY.   Labs:  Results for orders placed or performed in visit on 06/29/22  POCT urinalysis dipstick  Result Value Ref Range   Color, UA yellow    Clarity, UA clear    Glucose, UA Negative Negative   Bilirubin, UA neg    Ketones, UA neg    Spec Grav, UA 1.015 1.010 - 1.025   Blood, UA trace    pH, UA 6.0 5.0 - 8.0   Protein, UA Negative Negative   Urobilinogen, UA 0.2 0.2 or 1.0 E.U./dL   Nitrite, UA neg    Leukocytes, UA Negative Negative   Appearance     Odor       Assessment:   Patient s/p HYSTERECTOMY TOTAL LAPAROSCOPIC, LEFT SALPINGO- OOPHORECTOMY, PERITONEAL BIOPSIES, AND OMENTECTOMY  (surgery)  Doing well postoperatively.   Plan:   1. Continue any current medications as instructed by provider. 2. Wound care discussed. 3. Operative findings again reviewed. Pathology report discussed. 4. Activity  restrictions: no bending, stooping, or squatting, no lifting more than 10-15 pounds, and pelvic rest for 4 weeks 5. Anticipated return to work: 2-3 weeks. 6. Urinary burning, no obvious signs of UTI today, recently also had negative UA and culture with PCP. Advised on AZO, cranberry juice, etc. Can prescribe Uribel or Pyridium if needed.  7. Follow up: 4 weeks for final post-op visit with Oncology.     Rubie Maid, MD [facility]

## 2022-07-18 ENCOUNTER — Inpatient Hospital Stay: Payer: No Typology Code available for payment source

## 2022-07-18 ENCOUNTER — Inpatient Hospital Stay (HOSPITAL_BASED_OUTPATIENT_CLINIC_OR_DEPARTMENT_OTHER): Payer: No Typology Code available for payment source | Admitting: Obstetrics and Gynecology

## 2022-07-18 VITALS — BP 111/83 | HR 79 | Temp 98.7°F | Resp 20 | Wt 142.5 lb

## 2022-07-18 DIAGNOSIS — Z90710 Acquired absence of both cervix and uterus: Secondary | ICD-10-CM

## 2022-07-18 DIAGNOSIS — R300 Dysuria: Secondary | ICD-10-CM | POA: Diagnosis not present

## 2022-07-18 DIAGNOSIS — D3911 Neoplasm of uncertain behavior of right ovary: Secondary | ICD-10-CM

## 2022-07-18 DIAGNOSIS — Z90722 Acquired absence of ovaries, bilateral: Secondary | ICD-10-CM

## 2022-07-18 NOTE — Progress Notes (Signed)
Gynecologic Oncology H&P  Referring Provider: Dr. Marcelline Mates   Chief Complaint: granulosa cell tumor  Subjective:  Lori Beltran is a 46 y.o. female who is seen in consultation from Dr. Marcelline Mates for granulosa cell tumor.   Patient underwent completion surgery 06/13/22 with TLH, LSO, omentectomy, staging biopsies and washings. No evidence of residual cancer.   No new complaints today.  DIAGNOSIS:  A. UTERUS WITH CERVIX, LEFT FALLOPIAN TUBE AND OVARY; TOTAL HYSTERECTOMY  WITH LEFT SALPINGO-OOPHORECTOMY:  - UTERINE CERVIX:       - BENIGN TRANSFORMATION ZONE.       - NEGATIVE FOR SQUAMOUS INTRAEPITHELIAL LESION AND MALIGNANCY.  - ENDOMETRIUM:       - BENIGN ENDOMETRIAL POLYP.       - BACKGROUND MILDLY DISORDERED PROLIFERATIVE ENDOMETRIUM.       - NEGATIVE FOR ATYPICAL HYPERPLASIA/EIN AND MALIGNANCY.  - MYOMETRIUM:       - LEIOMYOMATA UTERI.       - NEGATIVE FOR FEATURES OF MALIGNANCY.  - UTERINE SEROSA:       - MILD LYMPHANGIECTASIA; OTHERWISE NO SIGNIFICANT HISTOPATHOLOGIC  CHANGE.  - FALLOPIAN TUBE:       - NO SIGNIFICANT HISTOPATHOLOGIC CHANGE.  - OVARY:       - BENIGN PHYSIOLOGIC CHANGES.       - NEGATIVE FOR MALIGNANCY.   B. OMENTUM; OMENTECTOMY:  - BENIGN FIBROADIPOSE OMENTAL TISSUE.  - NEGATIVE FOR ATYPIA AND MALIGNANCY.   C. SOFT TISSUE, RIGHT PARACOLIC GUTTER PERITONEUM; BIOPSY:  - BENIGN FIBROADIPOSE AND FIBROMEMBRANOUS TISSUE.  - NEGATIVE FOR ATYPIA AND MALIGNANCY   D. SOFT TISSUE, LEFT PARACOLIC GUTTER PERITONEUM; BIOPSY:  - BENIGN FIBROADIPOSE AND FIBROMEMBRANOUS TISSUE.  - NEGATIVE FOR ATYPIA AND MALIGNANCY.   Gynecologic Oncology History:  Patient presented to ER for RLQ abdominal pain. Was found to have hemoperitoneum, suspected ovarian source, RLQ pain, fibroid uterus. She was taken to OR by Dr. Marcelline Mates and Dr. Amalia Hailey on 03/30/22. Fallopian tubes previously surgically interrupted, appeared normal.  Left ovary appeared normal. Right ovary enlarged, 8-10 cm, with  rupture. Moderate hemoperitoneum. Left ovary appeared normal but was removed along with tube. Was not sent for frozen pathology.   DIAGNOSIS:  A. FALLOPIAN TUBE AND OVARY, RIGHT; SALPINGO-OOPHORECTOMY:  - OVARY:       - GRANULOSA CELL TUMOR, ADULT TYPE, DISRUPTED.       - BACKGROUND OVARIAN PARENCHYMA WITH PHYSIOLOGIC CHANGES AND STROMAL HEMORRHAGE, SUGGESTIVE OF EARLY TORSION.       - SEE CANCER SUMMARY.  - FALLOPIAN TUBE:       - BENIGN PARATUBAL CYST.       - OTHERWISE NO SIGNIFICANT HISTOPATHOLOGIC CHANGE.   pTNM CLASSIFICATION (AJCC 8th Edition):  pT1c2  FIGO STAGE: 1C2  She presents to clinic for evaluation and management.   CT scan A/P 03/29/22 Reproductive: Small anterior uterine body fibroid including at 1.3 cm on 71/2.   Right and posterior/central pelvic heterogeneous 9.2 x 9.7 cm "mass" is favored to represent primarily hematoma. This presumably obscures the right ovary, which may be positioned superiorly and anteriorly on 61/2. Contiguous small volume hemorrhage extends into the paracollic gutters including in the left upper quadrant on 24/2.     IMPRESSION: 1. Heterogeneous right posterior pelvic mass, favored to represent hematoma, presumably obscuring the right ovary. Question rupture of an underlying right ovarian mass. Correlate with beta HCG level, as ruptured ectopic pregnancy could have this appearance. Extension of smaller volume hemorrhage into the mid and lower abdomen.  2. Lingular  9 mm pulmonary nodule, indeterminate. Presuming the patient is not eventually diagnosed with primary malignancy, follow-up guidelines as follows: Consider one of the following in 3 months for both low-risk and high-risk individuals: (a) repeat chest CT, (b) follow-up PET-CT, or (c) tissue sampling. This recommendation follows the consensus statement: Guidelines for Management of Incidental Pulmonary Nodules Detected on CT Images: From the Fleischner Society 2017; Radiology 2017;  284:228-243. 3. Trace left pleural fluid. 4. Normal appendix. 5. Small uterine fibroid.  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/2015   Chronic constipation 07/15/2015   History of iron deficiency anemia 07/15/2015   Migraine without aura and without status migrainosus, not intractable 05/04/2015   Environmental and seasonal allergies 05/04/2015   Plantar fasciitis of left foot 05/04/2015   Vitamin D deficiency 05/04/2015   GERD (gastroesophageal reflux disease) 05/04/2015   Past Medical History: Past Medical History:  Diagnosis Date   Arthritis    left knee   Cervical dysplasia    CINII   Constipation    GERD (gastroesophageal reflux disease)    Herpes    Migraine    Obesity    Pap smear abnormality of vagina with ASC-US    Plantar fasciitis    Pre-diabetes    Sleep apnea    Snoring    Vitamin D deficiency    Past Surgical History: Past Surgical History:  Procedure Laterality Date   COLONOSCOPY WITH PROPOFOL N/A 12/30/2020   Procedure: COLONOSCOPY WITH PROPOFOL;  Surgeon: Jonathon Bellows, MD;  Location: [facility] ENDOSCOPY;  Service: Gastroenterology;  Laterality: N/A;   COLPOSCOPY     ENDOMETRIAL ABLATION  2011   KNEE ARTHROSCOPY Left 11/23/2021   Procedure: ARTHROSCOPY KNEE, CHONDROPLASTY OF PATELLORFEMORAL JOINT, REMOVAL OF LOOSE BODIES;  Surgeon: Thornton Park, MD;  Location: [facility] ORS;  Service: Orthopedics;  Laterality: Left;   LAPAROSCOPIC HYSTERECTOMY N/A 06/13/2022   Procedure: HYSTERECTOMY TOTAL LAPAROSCOPIC, LEFT SALPINGO- OOPHORECTOMY,  PERITONEAL BIOPSIES, AND OMENTECTOMY;  Surgeon: Mellody Drown, MD;  Location: [facility] ORS;  Service: Gynecology;  Laterality: N/A;   LAPAROSCOPY Right 03/29/2022   Procedure: LAPAROSCOPIC RIGHT SALPINGO-OOPHORECTOMY EVACUATION OF HEMOPERITONEUM ;  Surgeon: Rubie Maid, MD;  Location: [facility] ORS;  Service: Gynecology;  Laterality: Right;   LEEP     TUBAL LIGATION  2000   Past Gynecologic History:  Menarche: age 46 Sexually Active.  Pain with intercourse.  Hx of herpes.  Surgical contraception Denies history of abnormal pap  OB History:  OB History     Gravida  3   Para  3   Term  3   Preterm      AB      Living  3      SAB      IAB      Ectopic      Multiple      Live Births  3          Family History: Family History  Problem Relation Age of Onset   Sleep apnea Mother    Sleep apnea Father    Allergic rhinitis Daughter  Allergic rhinitis Son    Breast cancer Paternal Grandmother 9   Brain cancer Paternal Grandmother    Breast cancer Maternal Aunt 80   Colon cancer Maternal Uncle    Lung cancer Maternal Uncle    Prostate cancer Maternal Uncle    Bone cancer Maternal Aunt    Social History: Social History   Socioeconomic History   Marital status: Married    Spouse name: Gerald Stabs    Number of children: 3   Years of education: Not on file   Highest education level: Associate degree: occupational, Hotel manager, or vocational program  Occupational History   Not on file  Tobacco Use   Smoking status: Never   Smokeless tobacco: Never  Vaping Use   Vaping Use: Never used  Substance and Sexual Activity   Alcohol use: No    Alcohol/week: 0.0 standard drinks of alcohol   Drug use: No   Sexual activity: Not Currently    Partners: Male    Birth control/protection: Surgical    Comment: hysterectomy  Other Topics Concern   Not on file  Social History Narrative   Not on file   Social Determinants of Health   Financial Resource Strain: Low Risk   (02/09/2022)   Overall Financial Resource Strain (CARDIA)    Difficulty of Paying Living Expenses: Not hard at all  Food Insecurity: No Food Insecurity (02/09/2022)   Hunger Vital Sign    Worried About Running Out of Food in the Last Year: Never true    [place] in the Last Year: Never true  Transportation Needs: No Transportation Needs (02/09/2022)   PRAPARE - Hydrologist (Medical): No    Lack of Transportation (Non-Medical): No  Physical Activity: Sufficiently Active (02/09/2022)   Exercise Vital Sign    Days of Exercise per Week: 5 days    Minutes of Exercise per Session: 30 min  Stress: No Stress Concern Present (02/09/2022)   [place]    Feeling of Stress : Not at all  Social Connections: Moderately Integrated (02/09/2022)   Social Connection and Isolation Panel [NHANES]    Frequency of Communication with Friends and Family: More than three times a week    Frequency of Social Gatherings with Friends and Family: More than three times a week    Attends Religious Services: More than 4 times per year    Active Member of [employer] or Organizations: No    Attends Archivist Meetings: Never    Marital Status: Married   Allergies: Allergies  Allergen Reactions   Peanut Butter Flavor Itching and Swelling   Shellfish Allergy Itching and Swelling   Current Medications: Current Outpatient Medications on File Prior to Visit  Medication Sig Dispense Refill   acetaminophen (TYLENOL) 500 MG tablet Take 2 tablets (1,000 mg total) by mouth every 6 (six) hours as needed. 60 tablet 0   docusate sodium (COLACE) 100 MG capsule Take 1 capsule (100 mg total) by mouth 2 (two) times daily as needed for mild constipation. 30 capsule 0   EPINEPHrine 0.3 mg/0.3 mL IJ SOAJ injection Inject 0.3 mg into the muscle as needed for anaphylaxis. 1 each 1   omeprazole (PRILOSEC) 40 MG capsule TAKE 1 CAPSULE  (40 MG TOTAL) BY MOUTH DAILY. 30 capsule 5   Semaglutide-Weight Management (WEGOVY) 1.7 MG/0.75ML SOAJ Inject 1.7 mg into the skin once a week. 3 mL 0   valACYclovir (VALTREX) 500 MG  tablet TAKE 1 TABLET BY MOUTH TWICE DAILY FOR 3 DAYS AS NEEDED FOR SYMPTOMS 30 tablet 1   ibuprofen (ADVIL) 600 MG tablet Take 1 tablet (600 mg total) by mouth every 6 (six) hours as needed for mild pain. (Patient not taking: Reported on 07/18/2022) 60 tablet 1   No current facility-administered medications on file prior to visit.   Review of Systems General:  no complaints Skin: no complaints Eyes: no complaints HEENT: no complaints Breasts: no complaints Pulmonary: no complaints Cardiac: no complaints Gastrointestinal: no complaints Genitourinary/Sexual: no complaints Ob/Gyn: no complaints Musculoskeletal: no complaints Hematology: no complaints Neurologic/Psych: no complaints  Objective:  Physical Examination:  Today's Vitals   07/18/22 1334  BP: 111/83  Pulse: 79  Resp: 20  Temp: 98.7 F (37.1 C)  SpO2: 100%  Weight: 142 lb 8 oz (64.6 kg)   Body mass index is 26.06 kg/m.   ECOG Performance Status: 0 - Asymptomatic  GENERAL: Patient is a well appearing female in no acute distress. Accompanied by mother.  LUNGS:  Clear to auscultation bilaterally.   HEART:  Regular rate and rhythm.  ABDOMEN:  Soft, nontender.  No hernias, incisions well healed. Pinpoint clear suture material removed from mid-left abdomen. No masses or ascites EXTREMITIES:  No peripheral edema. Atraumatic. No cyanosis SKIN:  Clear with no obvious rashes or skin changes.  NEURO:  Nonfocal. Well oriented.  Appropriate affect.  Pelvic: Exam chaperoned by Nursing. Patient verbalized consent prior to exam.  EGBUS: no lesions, Vagina: no lesions, discharge or bleeding. Vaginal cuff is well approximated however, sutures are visible and intact. Mild tenderness. Cervix, Uterus: surgically removed. Adnexa: smooth. No masses. RV:  confirmatory.   Lab Review Labs on site today: inhibins pending today.   05/02/22 Inhibin A = 2.6 Inhibin B = 13.5  Radiologic Imaging:  11/22/21- CT Chest wo contrast Numerous bilateral noncalcified pulmonary nodules with the largest located in the left upper lobe measuring 8 mm. The 8 mm nodule corresponds to the abnormality seen on recent chest radiograph. Non-contrast chest CT at 3-6 months is recommended. If the nodules are stable at time of repeat CT, then future CT at 18-24 months (from today's scan) is considered optional for low-risk patients, but is recommended for high-risk patients. This recommendation follows the consensus statement: Guidelines for Management of Incidental Pulmonary Nodules Detected on CT Images: From the Fleischner Society 2017; Radiology 2017; 295:188-416.    03/29/22- CT Abdomen Pelvis W Contrast Lower chest: Lingular well-circumscribed 9 mm nodule on 01/06. Trace left pleural fluid. Normal heart size without pericardial or pleural effusion. Tiny hiatal hernia. Reproductive: Small anterior uterine body fibroid including at 1.3 cm on 71/2.   Right and posterior/central pelvic heterogeneous 9.2 x 9.7 cm "mass" is favored to represent primarily hematoma. This presumably obscures the right ovary, which may be positioned superiorly and anteriorly on 61/2. Contiguous small volume hemorrhage extends into the paracollic gutters including in the left upper quadrant on 24/2  Assessment:  Lori Beltran is a 46 y.o. 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  No other disease noted in the abdomen. Not fertility desiring. Now s/p TLH-LSO with biopsies, washings and omentectomy on 06/13/22 who presents for post-op exam and discussion of treatment options.   Inhibin levels normal post op.   Normal post op course.    Plan:   Problem List Items Addressed This Visit       Endocrine  Granulosa cell tumor of ovary, right - Primary   We discussed options for management including observation vs adjuvant chemotherapy.  In view of spontaneous rupture she is at increased risk of recurrence, but completion surgery including biopsies and washings was all negative. We discussed that chemotherapy is an option if she wants to be aggressive.  However, there is no proven benefit that this will reduce the risk of recurrence.  She strongly does not want to do chemotherapy.  Meanwhile, she is now postmenopausal, so this greatly reduces her estrogen levels.   We will plan to see her back for follow up in 3-4 months with inhibin levels.  Mellody Drown, MD  CC:  Steele Sizer, [place] [address] [place],  [place] [address] [phone]

## 2022-07-20 LAB — INHIBIN B: Inhibin B: <7 pg/mL

## 2022-07-24 ENCOUNTER — Ambulatory Visit
Admission: RE | Admit: 2022-07-24 | Discharge: 2022-07-24 | Disposition: A | Discharge status: Home / Self Care | Payer: No Typology Code available for payment source | Source: Ambulatory Visit | Attending: Nurse Practitioner | Admitting: Nurse Practitioner

## 2022-07-24 DIAGNOSIS — D3911 Neoplasm of uncertain behavior of right ovary: Secondary | ICD-10-CM | POA: Insufficient documentation

## 2022-07-24 LAB — INHIBIN A: Inhibin-A: 1 pg/mL

## 2022-07-24 MED ORDER — IOHEXOL 300 MG/ML  SOLN
75.0000 mL | Freq: Once | INTRAMUSCULAR | Status: AC | PRN
  Administered 2022-07-24: 100 mL via INTRAVENOUS

## 2022-07-30 ENCOUNTER — Other Ambulatory Visit: Payer: Self-pay | Admitting: Family Medicine

## 2022-07-30 ENCOUNTER — Other Ambulatory Visit: Payer: Self-pay

## 2022-07-30 ENCOUNTER — Encounter: Payer: Self-pay | Admitting: Nurse Practitioner

## 2022-07-30 DIAGNOSIS — E663 Overweight: Secondary | ICD-10-CM

## 2022-07-30 DIAGNOSIS — Z8639 Personal history of other endocrine, nutritional and metabolic disease: Secondary | ICD-10-CM

## 2022-07-30 NOTE — Progress Notes (Signed)
Letter provided to patient's insurance re: ct chest.

## 2022-07-31 ENCOUNTER — Other Ambulatory Visit: Payer: Self-pay | Admitting: Family Medicine

## 2022-07-31 ENCOUNTER — Other Ambulatory Visit: Payer: Self-pay

## 2022-07-31 DIAGNOSIS — E663 Overweight: Secondary | ICD-10-CM

## 2022-07-31 DIAGNOSIS — Z8639 Personal history of other endocrine, nutritional and metabolic disease: Secondary | ICD-10-CM

## 2022-07-31 MED FILL — Semaglutide (Weight Mngmt) Soln Auto-Injector 1.7 MG/0.75ML: SUBCUTANEOUS | 28 days supply | Qty: 3 | Fill #0 | Status: AC

## 2022-08-01 ENCOUNTER — Ambulatory Visit (INDEPENDENT_AMBULATORY_CARE_PROVIDER_SITE_OTHER): Payer: No Typology Code available for payment source | Admitting: Family Medicine

## 2022-08-01 ENCOUNTER — Encounter: Payer: Self-pay | Admitting: Family Medicine

## 2022-08-01 VITALS — BP 122/82 | HR 88 | Resp 18 | Ht 62.0 in

## 2022-08-01 DIAGNOSIS — T7840XA Allergy, unspecified, initial encounter: Secondary | ICD-10-CM

## 2022-08-01 MED ORDER — DIPHENHYDRAMINE HCL 50 MG/ML IJ SOLN
50.0000 mg | Freq: Once | INTRAMUSCULAR | Status: AC
  Administered 2022-08-01: 50 mg via INTRAMUSCULAR

## 2022-08-01 MED ORDER — DEXAMETHASONE SODIUM PHOSPHATE 10 MG/ML IJ SOLN
10.0000 mg | Freq: Once | INTRAMUSCULAR | Status: AC
  Administered 2022-08-01: 10 mg via INTRAMUSCULAR

## 2022-08-01 NOTE — Progress Notes (Signed)
Name: Lori Beltran   MRN: [id number]    DOB: July 15, 1976   Date:08/01/2022       Progress Note  Subjective  Chief Complaint  Chief Complaint  Patient presents with   Allergic Reaction    HPI  Hives: patient only had coffee for breakfast, while at work she used gloves to wipe her counter before lunch and noticed that her eyes were itching. She went to [facility] at lunch and it got a little worse, she ate lunch and felt that her eyelids were swelling and developed whelps on her arms . She has a history of angiodema and allergies, she has an epipen . She states her hives are worse than usual this time around. She denies any new hygiene products, no new food products, she is not sure of the trigger this time She denies SOB or wheezing, no dysphagia. She is itchy   Patient Active Problem List   Diagnosis Date Noted   Granulosa cell tumor of ovary, 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   Hemoperitoneum 03/29/2022   History of left knee surgery 03/08/2022   Pre-diabetes 03/08/2022   History of obesity 03/08/2022   Overweight (BMI 25.0-29.9) 03/08/2022   OSA on CPAP 07/20/2021   Primary osteoarthritis of both knees 01/31/2021   Angioedema 07/04/2018   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/2015   Chronic constipation 07/15/2015   History of iron deficiency anemia 07/15/2015   Migraine without aura and without status migrainosus, not intractable 05/04/2015   Environmental and seasonal allergies 05/04/2015   Plantar fasciitis of left foot 05/04/2015   Vitamin D deficiency 05/04/2015   GERD (gastroesophageal reflux disease) 05/04/2015    Social History   Tobacco Use   Smoking status: Never   Smokeless tobacco: Never  Substance Use Topics   Alcohol use: No    Alcohol/week: 0.0 standard drinks of alcohol     Current Outpatient Medications:    acetaminophen (TYLENOL) 500 MG tablet, Take 2 tablets (1,000 mg total) by mouth every 6 (six) hours as needed., Disp: 60  tablet, Rfl: 0   docusate sodium (COLACE) 100 MG capsule, Take 1 capsule (100 mg total) by mouth 2 (two) times daily as needed for mild constipation., Disp: 30 capsule, Rfl: 0   EPINEPHrine 0.3 mg/0.3 mL IJ SOAJ injection, Inject 0.3 mg into the muscle as needed for anaphylaxis., Disp: 1 each, Rfl: 1   ibuprofen (ADVIL) 600 MG tablet, Take 1 tablet (600 mg total) by mouth every 6 (six) hours as needed for mild pain. (Patient not taking: Reported on 07/18/2022), Disp: 60 tablet, Rfl: 1   omeprazole (PRILOSEC) 40 MG capsule, TAKE 1 CAPSULE (40 MG TOTAL) BY MOUTH DAILY., Disp: 30 capsule, Rfl: 5   valACYclovir (VALTREX) 500 MG tablet, TAKE 1 TABLET BY MOUTH TWICE DAILY FOR 3 DAYS AS NEEDED FOR SYMPTOMS, Disp: 30 tablet, Rfl: 1   Semaglutide-Weight Management (WEGOVY) 1.7 MG/0.75ML SOAJ, Inject 1.7 mg into the skin once a week., Disp: 3 mL, Rfl: 0  Allergies  Allergen Reactions   Peanut Butter Flavor Itching and Swelling   Shellfish Allergy Itching and Swelling    ROS  Ten systems reviewed and is negative except as mentioned in HPI   Objective  Vitals:   08/01/22 1401  BP: 122/82  Pulse: 88  Resp: 18  SpO2: 99%  Height: '5\' 2"'$  (1.575 m)    Body mass index is 26.06 kg/m.    Physical Exam  Constitutional: Patient appears well-developed  and well-nourished.  No distress.  HEENT: head atraumatic, normocephalic, pupils equal and reactive to light, neck supple, anigo edema around eyes  Cardiovascular: Normal rate, regular rhythm and normal heart sounds.  No murmur heard. No BLE edema. Pulmonary/Chest: Effort normal and breath sounds normal. No respiratory distress. Abdominal: Soft.  There is no tenderness. Skin: hives on arms and trunk  Psychiatric: Patient has a normal mood and affect. behavior is normal. Judgment and thought content normal.     Assessment & Plan   1. Allergic reaction, initial encounter  - dexamethasone (DECADRON) injection 10 mg - diphenhydrAMINE (BENADRYL)  injection 50 mg  She also took two Pepcid AC's 20 mg and one dose of Zytect 10 mg  Discussed importance of going to [facility] if no improvement or if she develops difficulty swallowing or breathing.   Her son will come to pick her up , advised her to not stay at home alone until symptoms improves

## 2022-08-01 NOTE — Progress Notes (Deleted)
Name: Lori Beltran   MRN: [id number]    DOB: [birth year]   Date:08/01/2022       Progress Note  Subjective  Chief Complaint  Allergic Reaction  HPI  *** Patient Active Problem List   Diagnosis Date Noted   Granulosa cell tumor of ovary, 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   Hemoperitoneum 03/29/2022   History of left knee surgery 03/08/2022   Pre-diabetes 03/08/2022   History of obesity 03/08/2022   Overweight (BMI 25.0-29.9) 03/08/2022   OSA on CPAP 07/20/2021   Primary osteoarthritis of both knees 01/31/2021   Angioedema 07/04/2018   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/2015   Chronic constipation 07/15/2015   History of iron deficiency anemia 07/15/2015   Migraine without aura and without status migrainosus, not intractable 05/04/2015   Environmental and seasonal allergies 05/04/2015   Plantar fasciitis of left foot 05/04/2015   Vitamin D deficiency 05/04/2015   GERD (gastroesophageal reflux disease) 05/04/2015    Past Surgical History:  Procedure Laterality Date   COLONOSCOPY WITH PROPOFOL N/A 12/30/2020   Procedure: COLONOSCOPY WITH PROPOFOL;  Surgeon: Jonathon Bellows, MD;  Location: [facility] ENDOSCOPY;  Service: Gastroenterology;  Laterality: N/A;   COLPOSCOPY     ENDOMETRIAL ABLATION  2011   KNEE ARTHROSCOPY Left 11/23/2021   Procedure: ARTHROSCOPY KNEE, CHONDROPLASTY OF PATELLORFEMORAL JOINT, REMOVAL OF LOOSE BODIES;  Surgeon: Thornton Park, MD;  Location: [facility] ORS;  Service: Orthopedics;  Laterality: Left;   LAPAROSCOPIC HYSTERECTOMY N/A 06/13/2022   Procedure: HYSTERECTOMY TOTAL LAPAROSCOPIC, LEFT SALPINGO- OOPHORECTOMY, PERITONEAL BIOPSIES, AND OMENTECTOMY;  Surgeon: Mellody Drown, MD;  Location: [facility] ORS;  Service: Gynecology;  Laterality: N/A;   LAPAROSCOPY Right 03/29/2022   Procedure: LAPAROSCOPIC RIGHT SALPINGO-OOPHORECTOMY EVACUATION OF HEMOPERITONEUM ;  Surgeon: Rubie Maid, MD;  Location: [facility] ORS;  Service: Gynecology;   Laterality: Right;   LEEP     TUBAL LIGATION  2000    Family History  Problem Relation Age of Onset   Sleep apnea Mother    Sleep apnea Father    Allergic rhinitis Daughter    Allergic rhinitis Son    Breast cancer Paternal Grandmother 34   Brain cancer Paternal Grandmother    Breast cancer Maternal Aunt 80   Colon cancer Maternal Uncle    Lung cancer Maternal Uncle    Prostate cancer Maternal Uncle    Bone cancer Maternal Aunt     Social History   Tobacco Use   Smoking status: Never   Smokeless tobacco: Never  Substance Use Topics   Alcohol use: No    Alcohol/week: 0.0 standard drinks of alcohol     Current Outpatient Medications:    acetaminophen (TYLENOL) 500 MG tablet, Take 2 tablets (1,000 mg total) by mouth every 6 (six) hours as needed., Disp: 60 tablet, Rfl: 0   docusate sodium (COLACE) 100 MG capsule, Take 1 capsule (100 mg total) by mouth 2 (two) times daily as needed for mild constipation., Disp: 30 capsule, Rfl: 0   EPINEPHrine 0.3 mg/0.3 mL IJ SOAJ injection, Inject 0.3 mg into the muscle as needed for anaphylaxis., Disp: 1 each, Rfl: 1   ibuprofen (ADVIL) 600 MG tablet, Take 1 tablet (600 mg total) by mouth every 6 (six) hours as needed for mild pain. (Patient not taking: Reported on 07/18/2022), Disp: 60 tablet, Rfl: 1   omeprazole (PRILOSEC) 40 MG capsule, TAKE 1 CAPSULE (40 MG TOTAL) BY MOUTH DAILY., Disp: 30 capsule, Rfl: 5   valACYclovir (VALTREX) 500 MG tablet,  TAKE 1 TABLET BY MOUTH TWICE DAILY FOR 3 DAYS AS NEEDED FOR SYMPTOMS, Disp: 30 tablet, Rfl: 1   Semaglutide-Weight Management (WEGOVY) 1.7 MG/0.75ML SOAJ, Inject 1.7 mg into the skin once a week., Disp: 3 mL, Rfl: 0  Allergies  Allergen Reactions   Peanut Butter Flavor Itching and Swelling   Shellfish Allergy Itching and Swelling    I personally reviewed active problem list, medication list, allergies, family history, social history, health maintenance with the patient/caregiver  today.   ROS  ***  Objective  Vitals:   08/01/22 1401  BP: 122/82  Pulse: 88  Resp: 18  SpO2: 99%  Height: '5\' 2"'$  (1.575 m)    Body mass index is 26.06 kg/m.  Physical Exam ***  Recent Results (from the past 2160 hour(s))  CBC with Differential/Platelet     Status: Abnormal   Collection Time: 06/05/22  1:50 PM  Result Value Ref Range   WBC 6.4 4.0 - 10.5 K/uL   RBC 4.21 3.87 - 5.11 MIL/uL   Hemoglobin 10.9 (L) 12.0 - 15.0 g/dL   HCT 34.8 (L) 36.0 - 46.0 %   MCV 82.7 80.0 - 100.0 fL   MCH 25.9 (L) 26.0 - 34.0 pg   MCHC 31.3 30.0 - 36.0 g/dL   RDW 14.3 11.5 - 15.5 %   Platelets 410 (H) 150 - 400 K/uL   nRBC 0.0 0.0 - 0.2 %   Neutrophils Relative % 46 %   Neutro Abs 3.0 1.7 - 7.7 K/uL   Lymphocytes Relative 43 %   Lymphs Abs 2.8 0.7 - 4.0 K/uL   Monocytes Relative 6 %   Monocytes Absolute 0.4 0.1 - 1.0 K/uL   Eosinophils Relative 3 %   Eosinophils Absolute 0.2 0.0 - 0.5 K/uL   Basophils Relative 2 %   Basophils Absolute 0.1 0.0 - 0.1 K/uL   Immature Granulocytes 0 %   Abs Immature Granulocytes 0.02 0.00 - 0.07 K/uL    Comment: Performed at [facility], [address] [place], [place] [address]  Comprehensive metabolic panel     Status: Abnormal   Collection Time: 06/05/22  1:50 PM  Result Value Ref Range   Sodium 139 135 - 145 mmol/L   Potassium 3.3 (L) 3.5 - 5.1 mmol/L   Chloride 109 98 - 111 mmol/L   CO2 24 22 - 32 mmol/L   Glucose, Bld 99 70 - 99 mg/dL    Comment: Glucose reference range applies only to samples taken after fasting for at least 8 hours.   BUN 18 6 - 20 mg/dL   Creatinine, Ser 0.74 0.44 - 1.00 mg/dL   Calcium 8.9 8.9 - 10.3 mg/dL   Total Protein 7.1 6.5 - 8.1 g/dL   Albumin 4.1 3.5 - 5.0 g/dL   AST 13 (L) 15 - 41 U/L   ALT 14 0 - 44 U/L   Alkaline Phosphatase 75 38 - 126 U/L   Total Bilirubin 0.3 0.3 - 1.2 mg/dL   GFR, Estimated >60 >60 mL/min    Comment: (NOTE) Calculated using the CKD-EPI Creatinine Equation (2021)     Anion gap 6 5 - 15    Comment: Performed at [facility], [address] [place],  [address]  Type and screen     Status: None   Collection Time: 06/05/22  1:50 PM  Result Value Ref Range   ABO/RH(D) O POS    Antibody Screen NEG    Sample Expiration 06/19/2022,2359  Extend sample reason      NO TRANSFUSIONS OR PREGNANCY IN THE PAST 3 MONTHS Performed at [facility], [address] [place], [place] [address]   Urinalysis, Routine w reflex microscopic Urine, Clean Catch     Status: Abnormal   Collection Time: 06/06/22  2:00 PM  Result Value Ref Range   Color, Urine YELLOW (A) YELLOW   APPearance CLEAR (A) CLEAR   Specific Gravity, Urine 1.028 1.005 - 1.030   pH 5.0 5.0 - 8.0   Glucose, UA NEGATIVE NEGATIVE mg/dL   Hgb urine dipstick NEGATIVE NEGATIVE   Bilirubin Urine NEGATIVE NEGATIVE   Ketones, ur NEGATIVE NEGATIVE mg/dL   Protein, ur NEGATIVE NEGATIVE mg/dL   Nitrite NEGATIVE NEGATIVE   Leukocytes,Ua NEGATIVE NEGATIVE    Comment: Performed at [facility], [address] [place], [place] [address]  Pregnancy, urine POC     Status: None   Collection Time: 06/13/22  6:20 AM  Result Value Ref Range   Preg Test, Ur NEGATIVE NEGATIVE    Comment:        THE SENSITIVITY OF THIS METHODOLOGY IS >24 mIU/mL   Cytology - Non PAP;     Status: None   Collection Time: 06/13/22  8:27 AM  Result Value Ref Range   CYTOLOGY - NON GYN      CYTOLOGY - NON PAP CASE: [id number] PATIENT: Karene Fry Non-Gynecological Cytology Report     Specimen Submitted: A. Pelvic washings  Clinical History: Granulosa cell tumor    DIAGNOSIS: A. PELVIC WASHINGS; LIQUID-BASED PREPARATION: - NEGATIVE FOR MALIGNANCY. - REACTIVE MESOTHELIAL CELLS, MACROPHAGES, AND RARE CHRONIC INFLAMMATORY CELLS.  Slides reviewed: 1 ThinPrep, 1 cell block, 2 cytospin  GROSS DESCRIPTION: A. Labeled: Pelvic washings Received: Fresh Collection time: 8:27 AM on  06/13/2022 Placed into formalin time: Not applicable Volume: Approximately 50 mL Description of fluid and container in which it is received: Received in a clear specimen container with a white screw top lid is yellow, cloudy fluid Cytospin slide(s) received: Yes, 2  Specimen material submitted for: Cell block and ThinPrep  The cell block material is fixed in formalin for 6 hours prior to processing.  CM 06/13/2022  Final Diagnosis performed by Allena Napoleon, MD.    Electronically signed 06/14/2022 3:08:01PM The electronic signature indicates that the named Attending Pathologist has evaluated the specimen Technical component performed at [place], [address], [place], [place] [address] Lab: [phone] Dir: Rush Farmer, MD, MMM  Professional component performed at [facility], [facility], [place], [place], [place] [address] Lab: [phone] Dir: Kathi Simpers, MD   Surgical pathology     Status: None   Collection Time: 06/13/22  9:00 AM  Result Value Ref Range   SURGICAL PATHOLOGY      SURGICAL PATHOLOGY CASE: [id number] PATIENT: Karene Fry Surgical Pathology Report     Specimen Submitted: A. Uterus, cervix, left ovary and tube B. Omentum C. Peritoneum, right gutter D. Peritoneum, left gutter  Clinical History: Granulosa cell tumor    DIAGNOSIS: A. UTERUS WITH CERVIX, LEFT FALLOPIAN TUBE AND OVARY; TOTAL HYSTERECTOMY WITH LEFT SALPINGO-OOPHORECTOMY: - UTERINE CERVIX:      - BENIGN TRANSFORMATION ZONE.      - NEGATIVE FOR SQUAMOUS INTRAEPITHELIAL LESION AND MALIGNANCY. - ENDOMETRIUM:      - BENIGN ENDOMETRIAL POLYP.      - BACKGROUND MILDLY DISORDERED PROLIFERATIVE ENDOMETRIUM.      - NEGATIVE FOR ATYPICAL HYPERPLASIA/EIN AND MALIGNANCY. - MYOMETRIUM:      -  LEIOMYOMATA UTERI.      - NEGATIVE FOR FEATURES OF MALIGNANCY. - UTERINE SEROSA:      - MILD LYMPHANGIECTASIA; OTHERWISE NO SIGNIFICANT HISTOPATHOLOGIC CHANGE. -  FALLOPIAN TUBE:      - NO SIGNIFICANT HISTOPATHOLOGIC CHANGE. - OVARY:      - BENIGN PHYSIOLOGIC CHANGES.       - NEGATIVE FOR MALIGNANCY.  B. OMENTUM; OMENTECTOMY: - BENIGN FIBROADIPOSE OMENTAL TISSUE. - NEGATIVE FOR ATYPIA AND MALIGNANCY.  C. SOFT TISSUE, RIGHT PARACOLIC GUTTER PERITONEUM; BIOPSY: - BENIGN FIBROADIPOSE AND FIBROMEMBRANOUS TISSUE. - NEGATIVE FOR ATYPIA AND MALIGNANCY  D. SOFT TISSUE, LEFT PARACOLIC GUTTER PERITONEUM; BIOPSY: - BENIGN FIBROADIPOSE AND FIBROMEMBRANOUS TISSUE. - NEGATIVE FOR ATYPIA AND MALIGNANCY.  Comment: This case is reviewed together with the prior right salpingo-oophorectomy ([id number]).  Prior staging remains unchanged.  GROSS DESCRIPTION: A. Labeled: Uterus, cervix, left tube and ovary Received: Fresh Collection time: 9 AM on 06/13/2022 Placed into formalin time: 10:29 AM on 06/13/2022 Weight: 133 grams Dimensions:      Fundus -8.9 (superior to inferior) x 6.3 (breadth of uterus at fundus) x 5.2 (anterior to posterior) cm      Cervix -3.7 x 3.3 cm Serosa: The serosa is tan-pink, smooth, and glistening with 2 subserosal nodu les on the anterior body, ranging from 1.3 to 1.7 cm in greatest dimension.  At the fundus there is a 0.4 x 0.3 cm area of flattened white-pink discoloration.  Additionally, the left fallopian tube is adherent to the serosa. Cervix: The cervix is tan, smooth, and pearly. Endocervix: The endocervix is tan, mucoid, and finely granular with a 0.8 cm slitlike endocervical os.  The surrounding cauterized areas are inked as follows: Anterior = blue and posterior = black. Endometrial cavity:      Dimensions -3.7 (superior to inferior) x 2.6 (cornu to cornu) cm      Thickness -0.1 cm      Other findings -the endometrium is tan-red and finely granular.  At the anterior body there is a 1.7 x 0.4 x 0.2 cm endometrial polyp. Myometrium:     Thickness -2.7 cm     Other findings -the myometrium is tan-pink with 3 intramural  nodules ranging from 0.5 to 1.5 cm in greatest dimension.  These nodules, along with the subserosal nodules, have tan-white, whorled, firm, and well-circumscrib ed cut surfaces. Adnexa:      Left ovary           Weight -5.48 grams           Measurement -0.6 x 2 x 1.5 cm           Serosa -the serosa is tan-pink and cerebriform.           Cut surface -the cut surface is mottled tan and white with 2 white cystlike areas ranging from 0.4 to 0.5 cm in greatest dimension. Additionally, there are at least 2 unilocular cysts ranging from 0.3 to 0.4 cm in greatest dimension.      Left fallopian tube            Measurements -6 cm in length x 0.5 cm in diameter           Other findings -the fallopian tube is fimbriated with a purple-tan, smooth, glistening serosa.  There are 2 paratubal cysts ranging from 0.6 to 1 cm in greatest dimension.  The lumen is focally disjointed, consistent with a prior tubal ligation.  The remaining lumen is pinpoint and patent. Other comments: None grossly appreciated.  Block summary: 1 - 4 - cervix/endocervix, 12-3:00 5 - 8 - cervix/endocervix, 3-6:00 9 - 12 - cervix/endocervix, 6-9:00 13 - 16 - cervix /endocervix, 9-12:00 76 - representative anterior transmural endomyometrium 18 - representative posterior transmural endomyometrium 19 - area of serosal discoloration, bisected and submitted entirely 20 - representative serosal adhesions and entirety of endometrial polyp 21 - representative intramural nodules 22 - representative subserosal nodules 23 - 29 - ovary, submitted entirely 30 - 31 - fallopian tube fimbria, longitudinally bisected and submitted entirely 32 - 34 - fallopian tube cross-sections, submitted entirely and sequentially from fimbriated end to uterine body  B. Labeled: Omentum Received: Fresh Collection time: 9:24 AM on 06/13/2022 Placed into formalin time: 10:29 AM on 06/13/2022 Tissue fragment(s): Multiple Size: Aggregate, 5.7 x 4.5 x 1.5  cm Description: Received are fragments of yellow lobulated adipose tissue admixed with tan fine fibrous tissue.  The fragments are serially sectioned and no distinct masses or lesions are grossly identified. Repr esentative sections are submitted in cassettes 1-5.  C. Labeled: Right gutter peritoneum Received: Fresh Collection time: 9:24 AM on 06/13/2022 Placed into formalin time: 10:29 AM on 06/13/2022 Tissue fragment(s): 1 Size: 1.2 x 0.5 x 0.3 cm Description: Received on a Telfa pad is a fragment of pink-tan soft tissue.  The fragment is serially sectioned. Entirely submitted in 1 cassette.  D. Labeled: Left gutter peritoneum Received: Fresh Collection time: 9:24 AM on 06/13/2022 Placed into formalin time: 10:29 AM on 06/13/2022 Tissue fragment(s): 1 Size: 1.5 x 0.8 x 0.7 cm Description: Received on Telfa pad is a fragment of pink-tan soft tissue.  The fragment is serially sectioned. Entirely submitted in 1 cassette.  RB 06/13/2022  Final Diagnosis performed by Allena Napoleon, MD.   Electronically signed 06/14/2022 3:08:27PM The electronic signature indicates that the named Attending Pathologist has evaluated the specimen Technical component performed at [facility], [address], [place], [place] [address] Lab: [phone] Dir: Rush Farmer, MD, MMM  Professional component performed at [facility], [facility], [place], [place], [place] [address] Lab: [phone] Dir: Kathi Simpers, MD   POCT urinalysis dipstick     Status: Abnormal   Collection Time: 06/20/22  1:05 PM  Result Value Ref Range   Color, UA gold    Clarity, UA clear    Glucose, UA Negative Negative   Bilirubin, UA negative    Ketones, UA negative    Spec Grav, UA 1.020 1.010 - 1.025   Blood, UA trace    pH, UA 6.0 5.0 - 8.0   Protein, UA Positive (A) Negative   Urobilinogen, UA negative (A) 0.2 or 1.0 E.U./dL   Nitrite, UA negative    Leukocytes, UA Trace (A) Negative   Appearance  clear    Odor strong   Urine Culture     Status: None   Collection Time: 06/20/22  1:33 PM   Specimen: Urine  Result Value Ref Range   MICRO NUMBER: [id number]    SPECIMEN QUALITY: Adequate    Sample Source URINE    STATUS: FINAL    Result:      Mixed genital flora isolated. These superficial bacteria are not indicative of a urinary tract infection. No further organism identification is warranted on this specimen. If clinically indicated, recollect clean-catch, mid-stream urine and transfer  immediately to Urine Culture Transport Tube.   Urinalysis, Complete w Microscopic     Status: Abnormal   Collection Time: 06/26/22  3:00 PM  Result Value Ref Range  Color, Urine YELLOW (A) YELLOW   APPearance CLEAR (A) CLEAR   Specific Gravity, Urine 1.025 1.005 - 1.030   pH 5.0 5.0 - 8.0   Glucose, UA NEGATIVE NEGATIVE mg/dL   Hgb urine dipstick NEGATIVE NEGATIVE   Bilirubin Urine NEGATIVE NEGATIVE   Ketones, ur NEGATIVE NEGATIVE mg/dL   Protein, ur NEGATIVE NEGATIVE mg/dL   Nitrite NEGATIVE NEGATIVE   Leukocytes,Ua NEGATIVE NEGATIVE   RBC / HPF 0-5 0 - 5 RBC/hpf   WBC, UA 0-5 0 - 5 WBC/hpf   Bacteria, UA RARE (A) NONE SEEN   Squamous Epithelial / LPF 0-5 0 - 5   Mucus PRESENT     Comment: Performed at [facility], [address], [place], [place] [address]  Urine culture     Status: None   Collection Time: 06/26/22  3:00 PM   Specimen: Urine, Random  Result Value Ref Range   Specimen Description      URINE, RANDOM Performed at [facility], [address] [place], [place] [address]    Special Requests      NONE Performed at [facility], [address] [place], [place] [address]    Culture      NO GROWTH Performed at [facility], [place] [address] [place], [place] [address]    Report Status 06/27/2022 FINAL   POCT urinalysis dipstick     Status: None   Collection Time: 06/29/22 11:01 AM  Result Value Ref Range   Color, UA yellow    Clarity,  UA clear    Glucose, UA Negative Negative   Bilirubin, UA neg    Ketones, UA neg    Spec Grav, UA 1.015 1.010 - 1.025   Blood, UA trace    pH, UA 6.0 5.0 - 8.0   Protein, UA Negative Negative   Urobilinogen, UA 0.2 0.2 or 1.0 E.U./dL   Nitrite, UA neg    Leukocytes, UA Negative Negative   Appearance     Odor    Inhibin B     Status: None   Collection Time: 07/18/22  3:01 PM  Result Value Ref Range   Inhibin B <7.0 pg/mL    Comment: (NOTE)                     Early Follicular           <093.8                     Late Follicular            <182.9                     Periovulatory              <189.0                     MidLuteal                  <164.0                     End Luteal                 <107.0                     Post Menopausal            < 17.0 This test was developed and  its performance characteristics determined by [facility]. It has not been cleared or approved by the Food and Drug Administration. Inhibin B performed by [employer]sh[employer]T(TM) Enzyme Linked Immunoassay methodology. Values obtained with different assay methods or kits cannot be used interchangeably. Performed At: [facility] [place], [place] [id number] Rush Farmer MD [id number]   Inhibin A     Status: None   Collection Time: 07/18/22  3:01 PM  Result Value Ref Range   Inhibin-A 1.0 pg/mL    Comment: (NOTE)                       Menstrual Phase                         Early Follicular        <92.1                         Late Follicular         <19.4                         Periovulatory       8.0-233.0                         MidLuteal              <145.0                         End Luteal             <145.0                         Postmenopausal           <4.0 Inhibin A performed by [employer] Access Automated Immunoassay methodology. Values obtained with different assay methods or kits cannot be used interchangeably. Performed At: [facility] [place], [place] [id number] Rush Farmer MD JE:[id number]     PHQ2/9:    08/01/2022    2:06 PM 06/20/2022    9:11 AM 03/29/2022    3:38 PM 03/28/2022    8:27 AM 03/08/2022    7:59 AM  Depression screen PHQ 2/9  Decreased Interest 0 0 0 0 0  Down, Depressed, Hopeless 0 0 0 0 0  PHQ - 2 Score 0 0 0 0 0  Altered sleeping 0    0  Tired, decreased energy 0    0  Change in appetite 0    0  Feeling bad or failure about yourself  0    0  Trouble concentrating 0    0  Moving slowly or fidgety/restless 0    0  Suicidal thoughts 0    0  PHQ-9 Score 0    0    phq 9 is {gen pos [id number]}   Fall Risk:    08/01/2022    2:06 PM 06/20/2022    9:11 AM 03/29/2022    3:38 PM 03/28/2022    8:27 AM 03/08/2022    7:59 AM  Fall Risk   Falls in the past year? 0 0 0 0 0  Number falls in past yr: 0 0 0 0   Injury with Fall? 0 0 0 0   Risk for fall due to : No Fall Risks  No Fall Risks No Fall Risks No Fall Risks  Follow up Falls prevention discussed Falls evaluation completed Falls prevention discussed Falls prevention discussed Falls prevention discussed      Functional Status Survey: Is the patient deaf or have difficulty hearing?: No Does the patient have difficulty seeing, even when wearing glasses/contacts?: No Does the patient have difficulty concentrating, remembering, or making decisions?: No Does the patient have difficulty walking or climbing stairs?: No Does the patient have difficulty dressing or bathing?: No Does the patient have difficulty doing errands alone such as visiting a doctor's office or shopping?: No    Assessment & Plan   1. Allergic reaction, initial encounter *** - dexamethasone (DECADRON) injection 10 mg - diphenhydrAMINE (BENADRYL) injection 50 mg

## 2022-08-08 ENCOUNTER — Encounter: Payer: Self-pay | Admitting: Nurse Practitioner

## 2022-08-14 ENCOUNTER — Inpatient Hospital Stay
Payer: No Typology Code available for payment source | Attending: Obstetrics and Gynecology | Admitting: Nurse Practitioner

## 2022-08-14 DIAGNOSIS — D3911 Neoplasm of uncertain behavior of right ovary: Secondary | ICD-10-CM | POA: Diagnosis not present

## 2022-08-14 DIAGNOSIS — Z800 Family history of malignant neoplasm of digestive organs: Secondary | ICD-10-CM | POA: Diagnosis not present

## 2022-08-14 DIAGNOSIS — C561 Malignant neoplasm of right ovary: Secondary | ICD-10-CM | POA: Insufficient documentation

## 2022-08-14 DIAGNOSIS — Z803 Family history of malignant neoplasm of breast: Secondary | ICD-10-CM | POA: Diagnosis not present

## 2022-08-14 DIAGNOSIS — Z4889 Encounter for other specified surgical aftercare: Secondary | ICD-10-CM | POA: Diagnosis not present

## 2022-08-14 DIAGNOSIS — Z90721 Acquired absence of ovaries, unilateral: Secondary | ICD-10-CM | POA: Diagnosis not present

## 2022-08-14 DIAGNOSIS — Z801 Family history of malignant neoplasm of trachea, bronchus and lung: Secondary | ICD-10-CM | POA: Diagnosis not present

## 2022-08-14 NOTE — Progress Notes (Signed)
Gynecologic Oncology H&P  Referring Provider: Dr. Marcelline Mates   Chief Complaint: granulosa cell tumor  Subjective:  Lori Beltran is a 46 y.o. female who is seen in consultation from Dr. 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   No pain or bleeding. Feels well and denies complaints. She has been able to return to work.   Gynecologic Oncology History:  Patient presented to ER for RLQ abdominal pain. Was found to have hemoperitoneum, suspected ovarian source, RLQ pain, fibroid uterus. She was taken to OR by Dr. Marcelline Mates and Dr. Amalia Hailey on 03/30/22. Fallopian tubes previously surgically interrupted, appeared normal.  Left ovary appeared normal. Right ovary enlarged, 8-10 cm, with rupture. Moderate hemoperitoneum. Left ovary appeared normal but was removed along with tube. Was not sent for frozen pathology.   DIAGNOSIS:  A. FALLOPIAN TUBE AND OVARY, RIGHT; SALPINGO-OOPHORECTOMY:  - OVARY:       - GRANULOSA CELL TUMOR, ADULT TYPE, DISRUPTED.       - BACKGROUND OVARIAN PARENCHYMA WITH PHYSIOLOGIC CHANGES AND STROMAL HEMORRHAGE, SUGGESTIVE OF EARLY TORSION.       - SEE CANCER SUMMARY.  - FALLOPIAN TUBE:       - BENIGN PARATUBAL CYST.       - OTHERWISE NO SIGNIFICANT HISTOPATHOLOGIC CHANGE.   pTNM CLASSIFICATION (AJCC 8th Edition):  pT1c2  FIGO STAGE: 1C2  She presents to clinic for evaluation and management.   CT scan A/P 03/29/22 Reproductive: Small anterior uterine body fibroid including at 1.3 cm on 71/2.   Right and posterior/central pelvic heterogeneous 9.2 x 9.7 cm "mass" is favored to represent primarily hematoma. This presumably obscures the right ovary, which may be positioned superiorly and anteriorly on 61/2. Contiguous small volume hemorrhage extends into the paracollic gutters including in the left upper quadrant on 24/2.     IMPRESSION: 1. Heterogeneous  right posterior pelvic mass, favored to represent hematoma, presumably obscuring the right ovary. Question rupture of an underlying right ovarian mass. Correlate with beta HCG level, as ruptured ectopic pregnancy could have this appearance. Extension of smaller volume hemorrhage into the mid and lower abdomen.  2. Lingular 9 mm pulmonary nodule, indeterminate. Presuming the patient is not eventually diagnosed with primary malignancy, follow-up guidelines as follows: Consider one of the following in 3 months for both low-risk and high-risk individuals: (a) repeat chest CT, (b) follow-up PET-CT, or (c) tissue sampling. This recommendation follows the consensus statement: Guidelines for Management of Incidental Pulmonary Nodules Detected on CT Images: From the Fleischner Society 2017; Radiology 2017; 284:228-243. 3. Trace left pleural fluid. 4. Normal appendix. 5. Small uterine fibroid.  Patient underwent completion surgery 06/13/22 with TLH, LSO, omentectomy, staging biopsies and washings. No evidence of residual cancer.   No new complaints today.  DIAGNOSIS:  A. UTERUS WITH CERVIX, LEFT FALLOPIAN TUBE AND OVARY; TOTAL HYSTERECTOMY  WITH LEFT SALPINGO-OOPHORECTOMY:  - UTERINE CERVIX:       - BENIGN TRANSFORMATION ZONE.       - NEGATIVE FOR SQUAMOUS INTRAEPITHELIAL LESION AND MALIGNANCY.  - ENDOMETRIUM:       - BENIGN ENDOMETRIAL POLYP.       - BACKGROUND MILDLY DISORDERED PROLIFERATIVE ENDOMETRIUM.       - NEGATIVE FOR ATYPICAL HYPERPLASIA/EIN AND MALIGNANCY.  - MYOMETRIUM:       - LEIOMYOMATA UTERI.       - NEGATIVE FOR FEATURES OF MALIGNANCY.  - UTERINE SEROSA:       -  MILD LYMPHANGIECTASIA; OTHERWISE NO SIGNIFICANT HISTOPATHOLOGIC  CHANGE.  - FALLOPIAN TUBE:       - NO SIGNIFICANT HISTOPATHOLOGIC CHANGE.  - OVARY:       - BENIGN PHYSIOLOGIC CHANGES.       - NEGATIVE FOR MALIGNANCY.   B. OMENTUM; OMENTECTOMY:  - BENIGN FIBROADIPOSE OMENTAL TISSUE.  - NEGATIVE FOR ATYPIA AND  MALIGNANCY.   C. SOFT TISSUE, RIGHT PARACOLIC GUTTER PERITONEUM; BIOPSY:  - BENIGN FIBROADIPOSE AND FIBROMEMBRANOUS TISSUE.  - NEGATIVE FOR ATYPIA AND MALIGNANCY   D. SOFT TISSUE, LEFT PARACOLIC GUTTER PERITONEUM; BIOPSY:  - BENIGN FIBROADIPOSE AND FIBROMEMBRANOUS TISSUE.  - NEGATIVE FOR ATYPIA AND MALIGNANCY.   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/2015   Chronic constipation 07/15/2015   History of iron deficiency anemia 07/15/2015   Migraine without aura and without status migrainosus, not intractable 05/04/2015   Environmental and seasonal allergies 05/04/2015   Plantar fasciitis of left foot 05/04/2015   Vitamin D deficiency 05/04/2015   GERD (gastroesophageal reflux disease) 05/04/2015   Past Medical History: Past Medical History:  Diagnosis Date   Arthritis    left knee   Cervical dysplasia    CINII   Constipation    GERD (gastroesophageal reflux disease)    Herpes    Migraine    Obesity    Pap smear abnormality of vagina with ASC-US    Plantar fasciitis    Pre-diabetes    Sleep apnea    Snoring    Vitamin D deficiency    Past Surgical History: Past Surgical History:  Procedure Laterality Date   COLONOSCOPY WITH PROPOFOL N/A 12/30/2020   Procedure: COLONOSCOPY WITH PROPOFOL;  Surgeon: Jonathon Bellows, MD;  Location[facility] ENDOSCOPY;  Service: Gastroenterology;  Laterality: N/A;   COLPOSCOPY     ENDOMETRIAL ABLATION  2011   KNEE ARTHROSCOPY Left 11/23/2021   Procedure: ARTHROSCOPY KNEE, CHONDROPLASTY OF PATELLORFEMORAL JOINT, REMOVAL OF LOOSE  BODIES;  Surgeon: Thornton Park, MD;  Location: [facility] ORS;  Service: Orthopedics;  Laterality: Left;   LAPAROSCOPIC HYSTERECTOMY N/A 06/13/2022   Procedure: HYSTERECTOMY TOTAL LAPAROSCOPIC, LEFT SALPINGO- OOPHORECTOMY, PERITONEAL BIOPSIES, AND OMENTECTOMY;  Surgeon: Mellody Drown, MD;  Location: [facility] ORS;  Service: Gynecology;  Laterality: N/A;   LAPAROSCOPY Right 03/29/2022   Procedure: LAPAROSCOPIC RIGHT SALPINGO-OOPHORECTOMY EVACUATION OF HEMOPERITONEUM ;  Surgeon: Rubie Maid, MD;  Location: [facility] ORS;  Service: Gynecology;  Laterality: Right;   LEEP     TUBAL LIGATION  2000   Past Gynecologic History:  Menarche: age 50 Sexually Active.  Pain with intercourse.  Hx of herpes.  Surgical contraception Denies history of abnormal pap  OB History:  OB History     Gravida  3   Para  3   Term  3   Preterm      AB      Living  3      SAB      IAB      Ectopic      Multiple      Live Births  3  Family History: Family History  Problem Relation Age of Onset   Sleep apnea Mother    Sleep apnea Father    Allergic rhinitis Daughter    Allergic rhinitis Son    Breast cancer Paternal Grandmother 20   Brain cancer Paternal Grandmother    Breast cancer Maternal Aunt 80   Colon cancer Maternal Uncle    Lung cancer Maternal Uncle    Prostate cancer Maternal Uncle    Bone cancer Maternal Aunt    Social History: Social History   Socioeconomic History   Marital status: Married    Spouse name: Gerald Stabs    Number of children: 3   Years of education: Not on file   Highest education level: Associate degree: occupational, Hotel manager, or vocational program  Occupational History   Not on file  Tobacco Use   Smoking status: Never   Smokeless tobacco: Never  Vaping Use   Vaping Use: Never used  Substance and Sexual Activity   Alcohol use: No    Alcohol/week: 0.0 standard drinks of alcohol   Drug use: No   Sexual activity: Not Currently    Partners: Male     Birth control/protection: Surgical    Comment: hysterectomy  Other Topics Concern   Not on file  Social History Narrative   Not on file   Social Determinants of Health   Financial Resource Strain: Low Risk  (02/09/2022)   Overall Financial Resource Strain (CARDIA)    Difficulty of Paying Living Expenses: Not hard at all  Food Insecurity: No Food Insecurity (02/09/2022)   Hunger Vital Sign    Worried About Running Out of Food in the Last Year: Never true    [place] in the Last Year: Never true  Transportation Needs: No Transportation Needs (02/09/2022)   PRAPARE - Hydrologist (Medical): No    Lack of Transportation (Non-Medical): No  Physical Activity: Sufficiently Active (02/09/2022)   Exercise Vital Sign    Days of Exercise per Week: 5 days    Minutes of Exercise per Session: 30 min  Stress: No Stress Concern Present (02/09/2022)   [place]    Feeling of Stress : Not at all  Social Connections: Moderately Integrated (02/09/2022)   Social Connection and Isolation Panel [NHANES]    Frequency of Communication with Friends and Family: More than three times a week    Frequency of Social Gatherings with Friends and Family: More than three times a week    Attends Religious Services: More than 4 times per year    Active Member of [employer] or Organizations: No    Attends Archivist Meetings: Never    Marital Status: Married   Allergies: Allergies  Allergen Reactions   Peanut Butter Flavor Itching and Swelling   Shellfish Allergy Itching and Swelling   Current Medications: Current Outpatient Medications on File Prior to Visit  Medication Sig Dispense Refill   docusate sodium (COLACE) 100 MG capsule Take 1 capsule (100 mg total) by mouth 2 (two) times daily as needed for mild constipation. 30 capsule 0   EPINEPHrine 0.3 mg/0.3 mL IJ SOAJ injection Inject 0.3 mg into the muscle  as needed for anaphylaxis. 1 each 1   omeprazole (PRILOSEC) 40 MG capsule TAKE 1 CAPSULE (40 MG TOTAL) BY MOUTH DAILY. 30 capsule 5   valACYclovir (VALTREX) 500 MG tablet TAKE 1 TABLET BY MOUTH TWICE DAILY FOR 3 DAYS AS NEEDED FOR  SYMPTOMS 30 tablet 1   Semaglutide-Weight Management (WEGOVY) 1.7 MG/0.75ML SOAJ Inject 1.7 mg into the skin once a week. 3 mL 0   No current facility-administered medications on file prior to visit.   Review of Systems General:  no complaints Pulmonary: no complaints Cardiac: no complaints Gastrointestinal: no complaints Genitourinary/Sexual: no complaints Ob/Gyn: no complaints Hematology: no complaints Neurologic/Psych: no complaints  Objective:  Physical Examination:  There were no vitals filed for this visit.  There is no height or weight on file to calculate BMI.   ECOG Performance Status: 0 - Asymptomatic  GENERAL: Patient is a well appearing female in no acute distress. Accompanied by mother.  ABDOMEN:  Soft, nontender.  No hernias, incisions well healed. EXTREMITIES:  No peripheral edema. Atraumatic. No cyanosis SKIN:  Clear with no obvious rashes or skin changes.  NEURO:  Nonfocal. Well oriented.  Appropriate affect.  Pelvic: Patient verbalized consent prior to exam.  EGBUS: no lesions, Vagina: no lesions, discharge or bleeding. Vaginal cuff is well approximated. No well healed. Bimanual deferred.    Lab Review No labs on site today  Radiologic Imaging:  No imaging on site today.   Assessment:  Lori Beltran is a 46 y.o. 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  No other disease noted in the abdomen. Not fertility desiring. Now s/p TLH-LSO with biopsies, washings and omentectomy on 06/13/22 who presents for vaginal cuff check. Now well healed.   Inhibin levels normal post op.    Plan:   Problem List Items Addressed This Visit    Vaginal cuff now  well healed. We reviewed gentle intercourse as well as post menopausal changes she may experience. She will continue follow up with gyn onc as scheduled.   RTC in interim as needed.   Beckey Rutter, DNP, AGNP-C [place] [facility] [phone] (clinic)

## 2022-08-22 ENCOUNTER — Other Ambulatory Visit: Payer: Self-pay

## 2022-08-22 ENCOUNTER — Encounter: Payer: Self-pay | Admitting: Family Medicine

## 2022-08-22 ENCOUNTER — Ambulatory Visit (INDEPENDENT_AMBULATORY_CARE_PROVIDER_SITE_OTHER): Payer: No Typology Code available for payment source | Admitting: Family Medicine

## 2022-08-22 VITALS — BP 120/80 | HR 85 | Temp 98.4°F | Resp 14 | Ht 62.0 in | Wt 142.2 lb

## 2022-08-22 DIAGNOSIS — D649 Anemia, unspecified: Secondary | ICD-10-CM | POA: Diagnosis not present

## 2022-08-22 DIAGNOSIS — T783XXD Angioneurotic edema, subsequent encounter: Secondary | ICD-10-CM | POA: Diagnosis not present

## 2022-08-22 DIAGNOSIS — E663 Overweight: Secondary | ICD-10-CM

## 2022-08-22 DIAGNOSIS — G43009 Migraine without aura, not intractable, without status migrainosus: Secondary | ICD-10-CM | POA: Diagnosis not present

## 2022-08-22 DIAGNOSIS — Z8543 Personal history of malignant neoplasm of ovary: Secondary | ICD-10-CM

## 2022-08-22 DIAGNOSIS — Z8639 Personal history of other endocrine, nutritional and metabolic disease: Secondary | ICD-10-CM

## 2022-08-22 MED ORDER — EPINEPHRINE 0.3 MG/0.3ML IJ SOAJ
0.3000 mg | INTRAMUSCULAR | 1 refills | Status: AC | PRN
  Filled 2022-08-22: qty 2, 30d supply, fill #0

## 2022-08-22 MED ORDER — WEGOVY 2.4 MG/0.75ML ~~LOC~~ SOAJ
2.4000 mg | SUBCUTANEOUS | 0 refills | Status: DC
  Filled 2022-08-22: qty 3, 28d supply, fill #0
  Filled 2022-09-28: qty 3, 28d supply, fill #1
  Filled 2022-10-30 – 2022-11-01 (×2): qty 3, 28d supply, fill #2

## 2022-08-22 NOTE — Progress Notes (Signed)
Name: Lori Beltran   MRN: [id number]    DOB: 1976/09/20   Date:08/22/2022       Progress Note  Subjective  Chief Complaint  Follow up   HPI  Obesity: she states her weight when she graduated HS was 120 lbs, She states after birth of her first child her weight stays around 150 lbs, she states after third she started to have problems staying around 150 lbs. She has tried [employer] but only lost 6 lbs,  She tried Contrave in the past but it caused nausea   Started Ozempic end of 06/2021 at a weight 185 lbs, she has lost down to 156 lbs, we switched to [facility] 02/2022 and today weight is down to 142.2 lbs, she states her goal is now 135 lbs and would like to go up on dose of Wegovy today   GERD:resolved with weight loss    Prediabetes  she is cutting down on sweets, she denies polyphagia, polydipsia or polyuria. 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 She has an epipen at home and has been able to avoid trigger foods, she had a severe episode early October without any specific triggers, she has an epipen at home   Chronic constipation: she is no longer having to take medications since weight loss, bowel movements are three times a week and Bristol scale goes from 3-4 , no straining   Migraine headaches: no problems lately, doing well . 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/2015   Chronic constipation 07/15/2015   History of iron deficiency anemia 07/15/2015   Migraine without aura and without status migrainosus, not intractable 05/04/2015   Environmental and seasonal allergies 05/04/2015   Plantar fasciitis of left foot 05/04/2015   Vitamin D deficiency 05/04/2015   GERD (gastroesophageal reflux disease) 05/04/2015    Past Surgical History:  Procedure Laterality Date   COLONOSCOPY WITH PROPOFOL N/A 12/30/2020   Procedure: COLONOSCOPY WITH PROPOFOL;  Surgeon: Jonathon Bellows, MD;  Location: [facility] ENDOSCOPY;  Service: Gastroenterology;  Laterality: N/A;   COLPOSCOPY     ENDOMETRIAL ABLATION  2011   KNEE ARTHROSCOPY Left 11/23/2021   Procedure: ARTHROSCOPY KNEE, CHONDROPLASTY OF PATELLORFEMORAL JOINT, REMOVAL OF LOOSE BODIES;  Surgeon: Thornton Park, MD;  Location: [facility] ORS;  Service: Orthopedics;  Laterality: Left;   LAPAROSCOPIC HYSTERECTOMY N/A 06/13/2022   Procedure: HYSTERECTOMY TOTAL LAPAROSCOPIC, LEFT SALPINGO- OOPHORECTOMY, PERITONEAL BIOPSIES, AND OMENTECTOMY;  Surgeon: Mellody Drown, MD;  Location: [facility] ORS;  Service: Gynecology;  Laterality: N/A;   LAPAROSCOPY Right 03/29/2022   Procedure: LAPAROSCOPIC RIGHT SALPINGO-OOPHORECTOMY EVACUATION OF  HEMOPERITONEUM ;  Surgeon: Rubie Maid, MD;  Location: [facility] ORS;  Service: Gynecology;  Laterality: Right;   LEEP     TUBAL LIGATION  2000    Family History  Problem Relation Age of Onset   Sleep apnea Mother    Sleep apnea Father    Allergic rhinitis Daughter    Allergic rhinitis Son    Breast cancer Paternal Grandmother 13   Brain cancer Paternal Grandmother    Breast cancer Maternal Aunt 80   Colon cancer Maternal Uncle    Lung cancer Maternal Uncle    Prostate cancer Maternal  Uncle    Bone cancer Maternal Aunt     Social History   Tobacco Use   Smoking status: Never   Smokeless tobacco: Never  Substance Use Topics   Alcohol use: No    Alcohol/week: 0.0 standard drinks of alcohol     Current Outpatient Medications:    Semaglutide-Weight Management (WEGOVY) 2.4 MG/0.75ML SOAJ, Inject 2.4 mg into the skin once a week., Disp: 9 mL, Rfl: 0   valACYclovir (VALTREX) 500 MG tablet, TAKE 1 TABLET BY MOUTH TWICE DAILY FOR 3 DAYS AS NEEDED FOR SYMPTOMS, Disp: 30 tablet, Rfl: 1   EPINEPHrine 0.3 mg/0.3 mL IJ SOAJ injection, Inject 0.3 mg into the muscle as needed for anaphylaxis., Disp: 2 each, Rfl: 1  Allergies  Allergen Reactions   Peanut Butter Flavor Itching and Swelling   Shellfish Allergy Itching and Swelling    I personally reviewed active problem list, medication list, allergies, family history, social history with the patient/caregiver today.   ROS  Ten systems reviewed and is negative except as mentioned in HPI   Objective  Vitals:   08/22/22 0803  BP: 120/80  Pulse: 85  Resp: 14  Temp: 98.4 F (36.9 C)  TempSrc: Oral  SpO2: 98%  Weight: 142 lb 3.2 oz (64.5 kg)  Height: '5\' 2"'$  (1.575 m)    Body mass index is 26.01 kg/m.  Physical Exam  Constitutional: Patient appears well-developed and well-nourished.  No distress.  HEENT: head atraumatic, normocephalic, pupils equal and reactive to light, neck supple, throat within normal limits Cardiovascular: Normal rate, regular rhythm and normal heart sounds.  No murmur heard. No BLE edema. Pulmonary/Chest: Effort normal and breath sounds normal. No respiratory distress. Abdominal: Soft.  There is no tenderness. Psychiatric: Patient has a normal mood and affect. behavior is normal. Judgment and thought content normal.   Recent Results (from the past 2160 hour(s))  CBC with Differential/Platelet     Status: Abnormal   Collection Time: 06/05/22  1:50 PM  Result Value Ref Range   WBC 6.4 4.0  - 10.5 K/uL   RBC 4.21 3.87 - 5.11 MIL/uL   Hemoglobin 10.9 (L) 12.0 - 15.0 g/dL   HCT 34.8 (L) 36.0 - 46.0 %   MCV 82.7 80.0 - 100.0 fL   MCH 25.9 (L) 26.0 - 34.0 pg   MCHC 31.3 30.0 - 36.0 g/dL   RDW 14.3 11.5 - 15.5 %   Platelets 410 (H) 150 - 400 K/uL   nRBC 0.0 0.0 - 0.2 %   Neutrophils Relative % 46 %   Neutro Abs 3.0 1.7 - 7.7 K/uL   Lymphocytes Relative 43 %   Lymphs Abs 2.8 0.7 - 4.0 K/uL   Monocytes Relative 6 %   Monocytes Absolute 0.4 0.1 - 1.0 K/uL   Eosinophils Relative 3 %   Eosinophils Absolute 0.2 0.0 - 0.5 K/uL   Basophils Relative 2 %  Basophils Absolute 0.1 0.0 - 0.1 K/uL   Immature Granulocytes 0 %   Abs Immature Granulocytes 0.02 0.00 - 0.07 K/uL    Comment: Performed at [facility], [address] [place], [place] [address]  Comprehensive metabolic panel     Status: Abnormal   Collection Time: 06/05/22  1:50 PM  Result Value Ref Range   Sodium 139 135 - 145 mmol/L   Potassium 3.3 (L) 3.5 - 5.1 mmol/L   Chloride 109 98 - 111 mmol/L   CO2 24 22 - 32 mmol/L   Glucose, Bld 99 70 - 99 mg/dL    Comment: Glucose reference range applies only to samples taken after fasting for at least 8 hours.   BUN 18 6 - 20 mg/dL   Creatinine, Ser 0.74 0.44 - 1.00 mg/dL   Calcium 8.9 8.9 - 10.3 mg/dL   Total Protein 7.1 6.5 - 8.1 g/dL   Albumin 4.1 3.5 - 5.0 g/dL   AST 13 (L) 15 - 41 U/L   ALT 14 0 - 44 U/L   Alkaline Phosphatase 75 38 - 126 U/L   Total Bilirubin 0.3 0.3 - 1.2 mg/dL   GFR, Estimated >60 >60 mL/min    Comment: (NOTE) Calculated using the CKD-EPI Creatinine Equation (2021)    Anion gap 6 5 - 15    Comment: Performed at [facility], [address] [place], [place] [address]  Type and screen     Status: None   Collection Time: 06/05/22  1:50 PM  Result Value Ref Range   ABO/RH(D) O POS    Antibody Screen NEG    Sample Expiration 06/19/2022,2359    Extend sample reason      NO TRANSFUSIONS OR PREGNANCY IN THE PAST 3  MONTHS Performed at [facility], [address] [place], [place] [address]   Urinalysis, Routine w reflex microscopic Urine, Clean Catch     Status: Abnormal   Collection Time: 06/06/22  2:00 PM  Result Value Ref Range   Color, Urine YELLOW (A) YELLOW   APPearance CLEAR (A) CLEAR   Specific Gravity, Urine 1.028 1.005 - 1.030   pH 5.0 5.0 - 8.0   Glucose, UA NEGATIVE NEGATIVE mg/dL   Hgb urine dipstick NEGATIVE NEGATIVE   Bilirubin Urine NEGATIVE NEGATIVE   Ketones, ur NEGATIVE NEGATIVE mg/dL   Protein, ur NEGATIVE NEGATIVE mg/dL   Nitrite NEGATIVE NEGATIVE   Leukocytes,Ua NEGATIVE NEGATIVE    Comment: Performed at [facility], [address] [place], [place] [address]  Pregnancy, urine POC     Status: None   Collection Time: 06/13/22  6:20 AM  Result Value Ref Range   Preg Test, Ur NEGATIVE NEGATIVE    Comment:        THE SENSITIVITY OF THIS METHODOLOGY IS >24 mIU/mL   Cytology - Non PAP;     Status: None   Collection Time: 06/13/22  8:27 AM  Result Value Ref Range   CYTOLOGY - NON GYN      CYTOLOGY - NON PAP CASE: [id number] PATIENT: Karene Fry Non-Gynecological Cytology Report     Specimen Submitted: A. Pelvic washings  Clinical History: Granulosa cell tumor    DIAGNOSIS: A. PELVIC WASHINGS; LIQUID-BASED PREPARATION: - NEGATIVE FOR MALIGNANCY. - REACTIVE MESOTHELIAL CELLS, MACROPHAGES, AND RARE CHRONIC INFLAMMATORY CELLS.  Slides reviewed: 1 ThinPrep, 1 cell block, 2 cytospin  GROSS DESCRIPTION: A. Labeled: Pelvic washings Received: Fresh Collection time: 8:27 AM on 06/13/2022 Placed into formalin time: Not  applicable Volume: Approximately 50 mL Description of fluid and container in which it is received: Received in a clear specimen container with a white screw top lid is yellow, cloudy fluid Cytospin slide(s) received: Yes, 2  Specimen material submitted for: Cell block and ThinPrep  The cell block material is fixed  in formalin for 6 hours prior to processing.  CM 06/13/2022  Final Diagnosis performed by Allena Napoleon, MD.    Electronically signed 06/14/2022 3:08:01PM The electronic signature indicates that the named Attending Pathologist has evaluated the specimen Technical component performed at [facility], [address], [place], [place] [address] Lab: [phone] Dir: Rush Farmer, MD, MMM  Professional component performed at [facility], [facility], [place], [place], [place] [address] Lab: [phone] Dir: Kathi Simpers, MD   Surgical pathology     Status: None   Collection Time: 06/13/22  9:00 AM  Result Value Ref Range   SURGICAL PATHOLOGY      SURGICAL PATHOLOGY CASE: [id number] PATIENT: Karene Fry Surgical Pathology Report     Specimen Submitted: A. Uterus, cervix, left ovary and tube B. Omentum C. Peritoneum, right gutter D. Peritoneum, left gutter  Clinical History: Granulosa cell tumor    DIAGNOSIS: A. UTERUS WITH CERVIX, LEFT FALLOPIAN TUBE AND OVARY; TOTAL HYSTERECTOMY WITH LEFT SALPINGO-OOPHORECTOMY: - UTERINE CERVIX:      - BENIGN TRANSFORMATION ZONE.      - NEGATIVE FOR SQUAMOUS INTRAEPITHELIAL LESION AND MALIGNANCY. - ENDOMETRIUM:      - BENIGN ENDOMETRIAL POLYP.      - BACKGROUND MILDLY DISORDERED PROLIFERATIVE ENDOMETRIUM.      - NEGATIVE FOR ATYPICAL HYPERPLASIA/EIN AND MALIGNANCY. - MYOMETRIUM:      - LEIOMYOMATA UTERI.      - NEGATIVE FOR FEATURES OF MALIGNANCY. - UTERINE SEROSA:      - MILD LYMPHANGIECTASIA; OTHERWISE NO SIGNIFICANT HISTOPATHOLOGIC CHANGE. - FALLOPIAN TUBE:      - NO SIGNIFICANT HISTOPATHOLOGIC CHANGE. - OVARY:      - BENIGN PHYSIOLOGIC CHANGES.       - NEGATIVE FOR MALIGNANCY.  B. OMENTUM; OMENTECTOMY: - BENIGN FIBROADIPOSE OMENTAL TISSUE. - NEGATIVE FOR ATYPIA AND MALIGNANCY.  C. SOFT TISSUE, RIGHT PARACOLIC GUTTER PERITONEUM; BIOPSY: - BENIGN FIBROADIPOSE AND FIBROMEMBRANOUS TISSUE. -  NEGATIVE FOR ATYPIA AND MALIGNANCY  D. SOFT TISSUE, LEFT PARACOLIC GUTTER PERITONEUM; BIOPSY: - BENIGN FIBROADIPOSE AND FIBROMEMBRANOUS TISSUE. - NEGATIVE FOR ATYPIA AND MALIGNANCY.  Comment: This case is reviewed together with the prior right salpingo-oophorectomy ([id number]).  Prior staging remains unchanged.  GROSS DESCRIPTION: A. Labeled: Uterus, cervix, left tube and ovary Received: Fresh Collection time: 9 AM on 06/13/2022 Placed into formalin time: 10:29 AM on 06/13/2022 Weight: 133 grams Dimensions:      Fundus -8.9 (superior to inferior) x 6.3 (breadth of uterus at fundus) x 5.2 (anterior to posterior) cm      Cervix -3.7 x 3.3 cm Serosa: The serosa is tan-pink, smooth, and glistening with 2 subserosal nodu les on the anterior body, ranging from 1.3 to 1.7 cm in greatest dimension.  At the fundus there is a 0.4 x 0.3 cm area of flattened white-pink discoloration.  Additionally, the left fallopian tube is adherent to the serosa. Cervix: The cervix is tan, smooth, and pearly. Endocervix: The endocervix is tan, mucoid, and finely granular with a 0.8 cm slitlike endocervical os.  The surrounding cauterized areas are inked as follows: Anterior = blue and posterior = black. Endometrial cavity:      Dimensions -3.7 (superior to inferior) x 2.6 (  cornu to cornu) cm      Thickness -0.1 cm      Other findings -the endometrium is tan-red and finely granular.  At the anterior body there is a 1.7 x 0.4 x 0.2 cm endometrial polyp. Myometrium:     Thickness -2.7 cm     Other findings -the myometrium is tan-pink with 3 intramural nodules ranging from 0.5 to 1.5 cm in greatest dimension.  These nodules, along with the subserosal nodules, have tan-white, whorled, firm, and well-circumscrib ed cut surfaces. Adnexa:      Left ovary           Weight -5.48 grams           Measurement -0.6 x 2 x 1.5 cm           Serosa -the serosa is tan-pink and cerebriform.           Cut surface -the  cut surface is mottled tan and white with 2 white cystlike areas ranging from 0.4 to 0.5 cm in greatest dimension. Additionally, there are at least 2 unilocular cysts ranging from 0.3 to 0.4 cm in greatest dimension.      Left fallopian tube            Measurements -6 cm in length x 0.5 cm in diameter           Other findings -the fallopian tube is fimbriated with a purple-tan, smooth, glistening serosa.  There are 2 paratubal cysts ranging from 0.6 to 1 cm in greatest dimension.  The lumen is focally disjointed, consistent with a prior tubal ligation.  The remaining lumen is pinpoint and patent. Other comments: None grossly appreciated.  Block summary: 1 - 4 - cervix/endocervix, 12-3:00 5 - 8 - cervix/endocervix, 3-6:00 9 - 12 - cervix/endocervix, 6-9:00 13 - 16 - cervix /endocervix, 9-12:00 49 - representative anterior transmural endomyometrium 18 - representative posterior transmural endomyometrium 19 - area of serosal discoloration, bisected and submitted entirely 20 - representative serosal adhesions and entirety of endometrial polyp 21 - representative intramural nodules 22 - representative subserosal nodules 23 - 29 - ovary, submitted entirely 30 - 31 - fallopian tube fimbria, longitudinally bisected and submitted entirely 32 - 34 - fallopian tube cross-sections, submitted entirely and sequentially from fimbriated end to uterine body  B. Labeled: Omentum Received: Fresh Collection time: 9:24 AM on 06/13/2022 Placed into formalin time: 10:29 AM on 06/13/2022 Tissue fragment(s): Multiple Size: Aggregate, 5.7 x 4.5 x 1.5 cm Description: Received are fragments of yellow lobulated adipose tissue admixed with tan fine fibrous tissue.  The fragments are serially sectioned and no distinct masses or lesions are grossly identified. Repr esentative sections are submitted in cassettes 1-5.  C. Labeled: Right gutter peritoneum Received: Fresh Collection time: 9:24 AM on  06/13/2022 Placed into formalin time: 10:29 AM on 06/13/2022 Tissue fragment(s): 1 Size: 1.2 x 0.5 x 0.3 cm Description: Received on a Telfa pad is a fragment of pink-tan soft tissue.  The fragment is serially sectioned. Entirely submitted in 1 cassette.  D. Labeled: Left gutter peritoneum Received: Fresh Collection time: 9:24 AM on 06/13/2022 Placed into formalin time: 10:29 AM on 06/13/2022 Tissue fragment(s): 1 Size: 1.5 x 0.8 x 0.7 cm Description: Received on Telfa pad is a fragment of pink-tan soft tissue.  The fragment is serially sectioned. Entirely submitted in 1 cassette.  RB 06/13/2022  Final Diagnosis performed by Allena Napoleon, MD.   Electronically signed 06/14/2022 3:08:27PM The electronic signature indicates that the named  Attending Pathologist has evaluated the specimen Technical component performed at [facility], [address] [place], [place] [address] Lab: [phone] Dir: Rush Farmer, MD, MMM  Professional component performed at [facility], [facility], [place], [place], [place] [address] Lab: [phone] Dir: Kathi Simpers, MD   POCT urinalysis dipstick     Status: Abnormal   Collection Time: 06/20/22  1:05 PM  Result Value Ref Range   Color, UA gold    Clarity, UA clear    Glucose, UA Negative Negative   Bilirubin, UA negative    Ketones, UA negative    Spec Grav, UA 1.020 1.010 - 1.025   Blood, UA trace    pH, UA 6.0 5.0 - 8.0   Protein, UA Positive (A) Negative   Urobilinogen, UA negative (A) 0.2 or 1.0 E.U./dL   Nitrite, UA negative    Leukocytes, UA Trace (A) Negative   Appearance clear    Odor strong   Urine Culture     Status: None   Collection Time: 06/20/22  1:33 PM   Specimen: Urine  Result Value Ref Range   MICRO NUMBER: [id number]    SPECIMEN QUALITY: Adequate    Sample Source URINE    STATUS: FINAL    Result:      Mixed genital flora isolated. These superficial bacteria are not indicative of a urinary tract  infection. No further organism identification is warranted on this specimen. If clinically indicated, recollect clean-catch, mid-stream urine and transfer  immediately to Urine Culture Transport Tube.   Urinalysis, Complete w Microscopic     Status: Abnormal   Collection Time: 06/26/22  3:00 PM  Result Value Ref Range   Color, Urine YELLOW (A) YELLOW   APPearance CLEAR (A) CLEAR   Specific Gravity, Urine 1.025 1.005 - 1.030   pH 5.0 5.0 - 8.0   Glucose, UA NEGATIVE NEGATIVE mg/dL   Hgb urine dipstick NEGATIVE NEGATIVE   Bilirubin Urine NEGATIVE NEGATIVE   Ketones, ur NEGATIVE NEGATIVE mg/dL   Protein, ur NEGATIVE NEGATIVE mg/dL   Nitrite NEGATIVE NEGATIVE   Leukocytes,Ua NEGATIVE NEGATIVE   RBC / HPF 0-5 0 - 5 RBC/hpf   WBC, UA 0-5 0 - 5 WBC/hpf   Bacteria, UA RARE (A) NONE SEEN   Squamous Epithelial / LPF 0-5 0 - 5   Mucus PRESENT     Comment: Performed at [facility], [address], [place], [place] [address]  Urine culture     Status: None   Collection Time: 06/26/22  3:00 PM   Specimen: Urine, Random  Result Value Ref Range   Specimen Description      URINE, RANDOM Performed at [facility], [address] [place], [place] [address]    Special Requests      NONE Performed at [facility], [address] [place], [place] [address]    Culture      NO GROWTH Performed at [facility], [place] [address], [place],  [address]    Report Status 06/27/2022 FINAL   POCT urinalysis dipstick     Status: None   Collection Time: 06/29/22 11:01 AM  Result Value Ref Range   Color, UA yellow    Clarity, UA clear    Glucose, UA Negative Negative   Bilirubin, UA neg    Ketones, UA neg    Spec Grav, UA 1.015 1.010 - 1.025   Blood, UA trace    pH, UA 6.0 5.0 - 8.0  Protein, UA Negative Negative   Urobilinogen, UA 0.2 0.2 or 1.0 E.U./dL   Nitrite, UA neg    Leukocytes, UA Negative Negative   Appearance     Odor    Inhibin B     Status:  None   Collection Time: 07/18/22  3:01 PM  Result Value Ref Range   Inhibin B <7.0 pg/mL    Comment: (NOTE)                     Early Follicular           <947.6                     Late Follicular            <546.5                     Periovulatory              <189.0                     MidLuteal                  <164.0                     End Luteal                 <107.0                     Post Menopausal            < 17.0 This test was developed and its performance characteristics determined by [facility]. It has not been cleared or approved by the Food and Drug Administration. Inhibin B performed by [employer](TM) Enzyme Linked Immunoassay methodology. Values obtained with different assay methods or kits cannot be used interchangeably. Performed At: [facility] [place], [place] [id number] Rush Farmer MD EX:[id number]   Inhibin A     Status: None   Collection Time: 07/18/22  3:01 PM  Result Value Ref Range   Inhibin-A 1.0 pg/mL    Comment: (NOTE)                       Menstrual Phase                         Early Follicular        <49.6                         Late Follicular         <75.9                         Periovulatory       8.0-233.0                         MidLuteal              <145.0                         End Luteal             <145.0  Postmenopausal           <4.0 Inhibin A performed by [employer] Access Automated Immunoassay methodology. Values obtained with different assay methods or kits cannot be used interchangeably. Performed At: [facility] [place], [place] [id number] Rush Farmer MD VU:[id number]       PHQ2/9:    08/22/2022    8:05 AM 08/01/2022    2:06 PM 06/20/2022    9:11 AM 03/29/2022    3:38 PM 03/28/2022    8:27 AM  Depression screen PHQ 2/9  Decreased Interest 0 0 0 0 0  Down, Depressed, Hopeless 0 0 0 0 0  PHQ - 2 Score 0 0 0 0 0  Altered sleeping 0 0      Tired, decreased energy 0 0     Change in appetite 0 0     Feeling bad or failure about yourself  0 0     Trouble concentrating 0 0     Moving slowly or fidgety/restless 0 0     Suicidal thoughts 0 0     PHQ-9 Score 0 0       phq 9 is negative   Fall Risk:    08/22/2022    8:05 AM 08/01/2022    2:06 PM 06/20/2022    9:11 AM 03/29/2022    3:38 PM 03/28/2022    8:27 AM  Fall Risk   Falls in the past year? 0 0 0 0 0  Number falls in past yr:  0 0 0 0  Injury with Fall?  0 0 0 0  Risk for fall due to : No Fall Risks No Fall Risks  No Fall Risks No Fall Risks  Follow up Falls prevention discussed;Education provided;Falls evaluation completed Falls prevention discussed Falls evaluation completed Falls prevention discussed Falls prevention discussed     Assessment & Plan  1. Anemia, unspecified type  - CBC with Differential/Platelet - Iron, TIBC and Ferritin Panel  2. Overweight (BMI 25.0-29.9)  - Semaglutide-Weight Management (WEGOVY) 2.4 MG/0.75ML SOAJ; Inject 2.4 mg into the skin once a week.  Dispense: 9 mL; Refill: 0  3. Migraine without aura and without status migrainosus, not intractable   4. Angioedema, subsequent encounter  - EPINEPHrine 0.3 mg/0.3 mL IJ SOAJ injection; Inject 0.3 mg into the muscle as needed for anaphylaxis.  Dispense: 1 each; Refill: 1  5. History of obesity  - Semaglutide-Weight Management (WEGOVY) 2.4 MG/0.75ML SOAJ; Inject 2.4 mg into the skin once a week.  Dispense: 9 mL; Refill: 0  6. History of ovarian cancer    Discussed genetic testing

## 2022-08-23 LAB — CBC WITH DIFFERENTIAL/PLATELET
Absolute Monocytes: 378 cells/uL (ref 200–950)
Basophils Absolute: 119 cells/uL (ref 0–200)
Basophils Relative: 2.2 %
Eosinophils Absolute: 151 cells/uL (ref 15–500)
Eosinophils Relative: 2.8 %
HCT: 35.5 % (ref 35.0–45.0)
Hemoglobin: 11.2 g/dL — ABNORMAL LOW (ref 11.7–15.5)
Lymphs Abs: 2435 cells/uL (ref 850–3900)
MCH: 26.1 pg — ABNORMAL LOW (ref 27.0–33.0)
MCHC: 31.5 g/dL — ABNORMAL LOW (ref 32.0–36.0)
MCV: 82.8 fL (ref 80.0–100.0)
MPV: 10 fL (ref 7.5–12.5)
Monocytes Relative: 7 %
Neutro Abs: 2317 cells/uL (ref 1500–7800)
Neutrophils Relative %: 42.9 %
Platelets: 394 10*3/uL (ref 140–400)
RBC: 4.29 10*6/uL (ref 3.80–5.10)
RDW: 16 % — ABNORMAL HIGH (ref 11.0–15.0)
Total Lymphocyte: 45.1 %
WBC: 5.4 10*3/uL (ref 3.8–10.8)

## 2022-08-23 LAB — IRON,TIBC AND FERRITIN PANEL
%SAT: 10 % (calc) — ABNORMAL LOW (ref 16–45)
Ferritin: 23 ng/mL (ref 16–232)
Iron: 33 ug/dL — ABNORMAL LOW (ref 40–190)
TIBC: 342 mcg/dL (calc) (ref 250–450)

## 2022-09-28 ENCOUNTER — Other Ambulatory Visit: Payer: Self-pay

## 2022-10-31 ENCOUNTER — Other Ambulatory Visit: Payer: Self-pay

## 2022-11-01 ENCOUNTER — Other Ambulatory Visit: Payer: Self-pay

## 2022-11-07 ENCOUNTER — Other Ambulatory Visit: Payer: Self-pay | Admitting: Family Medicine

## 2022-11-07 DIAGNOSIS — Z1231 Encounter for screening mammogram for malignant neoplasm of breast: Secondary | ICD-10-CM

## 2022-11-21 ENCOUNTER — Inpatient Hospital Stay (HOSPITAL_BASED_OUTPATIENT_CLINIC_OR_DEPARTMENT_OTHER): Payer: 59 | Admitting: Obstetrics and Gynecology

## 2022-11-21 ENCOUNTER — Inpatient Hospital Stay: Payer: 59 | Attending: Obstetrics and Gynecology

## 2022-11-21 VITALS — BP 125/88 | HR 71 | Temp 97.8°F | Resp 20 | Wt 140.3 lb

## 2022-11-21 DIAGNOSIS — D3911 Neoplasm of uncertain behavior of right ovary: Secondary | ICD-10-CM

## 2022-11-21 DIAGNOSIS — R300 Dysuria: Secondary | ICD-10-CM

## 2022-11-21 DIAGNOSIS — N9412 Deep dyspareunia: Secondary | ICD-10-CM | POA: Diagnosis not present

## 2022-11-21 DIAGNOSIS — Z90710 Acquired absence of both cervix and uterus: Secondary | ICD-10-CM | POA: Diagnosis not present

## 2022-11-21 DIAGNOSIS — Z90721 Acquired absence of ovaries, unilateral: Secondary | ICD-10-CM | POA: Diagnosis not present

## 2022-11-21 DIAGNOSIS — N941 Unspecified dyspareunia: Secondary | ICD-10-CM

## 2022-11-21 NOTE — Progress Notes (Signed)
Gynecologic Oncology H&P  Referring Provider: Dr. Marcelline Mates   Chief Complaint: granulosa cell tumor  Subjective:  Lori Beltran is a 47 y.o. female who is seen in consultation from Dr. 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   No pain or bleeding. Feels well and denies complaints. She has been able to return to work.   Gynecologic Oncology History:  Patient presented to ER for RLQ abdominal pain. Was found to have hemoperitoneum, suspected ovarian source, RLQ pain, fibroid uterus. She was taken to OR by Dr. Marcelline Mates and Dr. Amalia Hailey on 03/30/22. Fallopian tubes previously surgically interrupted, appeared normal.  Left ovary appeared normal. Right ovary enlarged, 8-10 cm, with rupture. Moderate hemoperitoneum. Left ovary appeared normal but was removed along with tube. Was not sent for frozen pathology.   DIAGNOSIS:  A. FALLOPIAN TUBE AND OVARY, RIGHT; SALPINGO-OOPHORECTOMY:  - OVARY:       - GRANULOSA CELL TUMOR, ADULT TYPE, DISRUPTED.       - BACKGROUND OVARIAN PARENCHYMA WITH PHYSIOLOGIC CHANGES AND STROMAL HEMORRHAGE, SUGGESTIVE OF EARLY TORSION.       - SEE CANCER SUMMARY.  - FALLOPIAN TUBE:       - BENIGN PARATUBAL CYST.       - OTHERWISE NO SIGNIFICANT HISTOPATHOLOGIC CHANGE.   pTNM CLASSIFICATION (AJCC 8th Edition):  pT1c2  FIGO STAGE: 1C2  She presents to clinic for evaluation and management.   CT scan A/P 03/29/22 Reproductive: Small anterior uterine body fibroid including at 1.3 cm on 71/2.   Right and posterior/central pelvic heterogeneous 9.2 x 9.7 cm "mass" is favored to represent primarily hematoma. This presumably obscures the right ovary, which may be positioned superiorly and anteriorly on 61/2. Contiguous small volume hemorrhage extends into the paracollic gutters including in the left upper quadrant on 24/2.     IMPRESSION: 1. Heterogeneous  right posterior pelvic mass, favored to represent hematoma, presumably obscuring the right ovary. Question rupture of an underlying right ovarian mass. Correlate with beta HCG level, as ruptured ectopic pregnancy could have this appearance. Extension of smaller volume hemorrhage into the mid and lower abdomen.  2. Lingular 9 mm pulmonary nodule, indeterminate. Presuming the patient is not eventually diagnosed with primary malignancy, follow-up guidelines as follows: Consider one of the following in 3 months for both low-risk and high-risk individuals: (a) repeat chest CT, (b) follow-up PET-CT, or (c) tissue sampling. This recommendation follows the consensus statement: Guidelines for Management of Incidental Pulmonary Nodules Detected on CT Images: From the Fleischner Society 2017; Radiology 2017; 284:228-243. 3. Trace left pleural fluid. 4. Normal appendix. 5. Small uterine fibroid.  Patient underwent completion surgery 06/13/22 with TLH, LSO, omentectomy, staging biopsies and washings. No evidence of residual cancer.   No new complaints today.  DIAGNOSIS:  A. UTERUS WITH CERVIX, LEFT FALLOPIAN TUBE AND OVARY; TOTAL HYSTERECTOMY  WITH LEFT SALPINGO-OOPHORECTOMY:  - UTERINE CERVIX:       - BENIGN TRANSFORMATION ZONE.       - NEGATIVE FOR SQUAMOUS INTRAEPITHELIAL LESION AND MALIGNANCY.  - ENDOMETRIUM:       - BENIGN ENDOMETRIAL POLYP.       - BACKGROUND MILDLY DISORDERED PROLIFERATIVE ENDOMETRIUM.       - NEGATIVE FOR ATYPICAL HYPERPLASIA/EIN AND MALIGNANCY.  - MYOMETRIUM:       - LEIOMYOMATA UTERI.       - NEGATIVE FOR FEATURES OF MALIGNANCY.  - UTERINE SEROSA:       -  MILD LYMPHANGIECTASIA; OTHERWISE NO SIGNIFICANT HISTOPATHOLOGIC  CHANGE.  - FALLOPIAN TUBE:       - NO SIGNIFICANT HISTOPATHOLOGIC CHANGE.  - OVARY:       - BENIGN PHYSIOLOGIC CHANGES.       - NEGATIVE FOR MALIGNANCY.   B. OMENTUM; OMENTECTOMY:  - BENIGN FIBROADIPOSE OMENTAL TISSUE.  - NEGATIVE FOR ATYPIA AND  MALIGNANCY.   C. SOFT TISSUE, RIGHT PARACOLIC GUTTER PERITONEUM; BIOPSY:  - BENIGN FIBROADIPOSE AND FIBROMEMBRANOUS TISSUE.  - NEGATIVE FOR ATYPIA AND MALIGNANCY   D. SOFT TISSUE, LEFT PARACOLIC GUTTER PERITONEUM; BIOPSY:  - BENIGN FIBROADIPOSE AND FIBROMEMBRANOUS TISSUE.  - NEGATIVE FOR ATYPIA AND MALIGNANCY.   Problem List: Patient Active Problem List   Diagnosis Date Noted   Dyspareunia in female 11/21/2022   History of ovarian cancer 08/22/2022   Granulosa cell tumor of ovary, 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   Hemoperitoneum 03/29/2022   History of left knee surgery 03/08/2022   Pre-diabetes 03/08/2022   History of obesity 03/08/2022   Overweight (BMI 25.0-29.9) 03/08/2022   OSA on CPAP 07/20/2021   Primary osteoarthritis of both knees 01/31/2021   Angioedema 07/04/2018   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/2015   Chronic constipation 07/15/2015   History of iron deficiency anemia 07/15/2015   Migraine without aura and without status migrainosus, not intractable 05/04/2015   Environmental and seasonal allergies 05/04/2015   Plantar fasciitis of left foot 05/04/2015   Vitamin D deficiency 05/04/2015   GERD (gastroesophageal reflux disease) 05/04/2015   Past Medical History: Past Medical History:  Diagnosis Date   Arthritis    left knee   Cervical dysplasia    CINII   Constipation    GERD (gastroesophageal reflux disease)    Herpes    Migraine    Obesity    Pap smear abnormality of vagina with ASC-US    Plantar fasciitis    Pre-diabetes    Sleep apnea    Snoring    Vitamin D deficiency    Past Surgical History: Past Surgical History:  Procedure Laterality Date   COLONOSCOPY WITH PROPOFOL N/A 12/30/2020   Procedure: COLONOSCOPY WITH PROPOFOL;  Surgeon: Jonathon Bellows, MD;  Location: [facility] ENDOSCOPY;  Service: Gastroenterology;  Laterality: N/A;   COLPOSCOPY     ENDOMETRIAL ABLATION  2011   KNEE ARTHROSCOPY Left 11/23/2021    Procedure: ARTHROSCOPY KNEE, CHONDROPLASTY OF PATELLORFEMORAL JOINT, REMOVAL OF LOOSE BODIES;  Surgeon: Thornton Park, MD;  Location: [facility] ORS;  Service: Orthopedics;  Laterality: Left;   LAPAROSCOPIC HYSTERECTOMY N/A 06/13/2022   Procedure: HYSTERECTOMY TOTAL LAPAROSCOPIC, LEFT SALPINGO- OOPHORECTOMY, PERITONEAL BIOPSIES, AND OMENTECTOMY;  Surgeon: Mellody Drown, MD;  Location: [facility] ORS;  Service: Gynecology;  Laterality: N/A;   LAPAROSCOPY Right 03/29/2022   Procedure: LAPAROSCOPIC RIGHT SALPINGO-OOPHORECTOMY EVACUATION OF HEMOPERITONEUM ;  Surgeon: Rubie Maid, MD;  Location: [facility] ORS;  Service: Gynecology;  Laterality: Right;   LEEP     TUBAL LIGATION  2000   Past Gynecologic History:  Menarche: age 59 Sexually Active.  Pain with intercourse.  Hx of herpes.  Surgical contraception Denies history of abnormal pap  OB History:  OB History     Gravida  3   Para  3   Term  3   Preterm      AB      Living  3      SAB      IAB      Ectopic      Multiple  Live Births  3          Family History: Family History  Problem Relation Age of Onset   Sleep apnea Mother    Sleep apnea Father    Allergic rhinitis Daughter    Allergic rhinitis Son    Breast cancer Paternal Grandmother 20   Brain cancer Paternal Grandmother    Breast cancer Maternal Aunt 80   Colon cancer Maternal Uncle    Lung cancer Maternal Uncle    Prostate cancer Maternal Uncle    Bone cancer Maternal Aunt    Social History: Social History   Socioeconomic History   Marital status: Married    Spouse name: Gerald Stabs    Number of children: 3   Years of education: Not on file   Highest education level: Associate degree: occupational, Hotel manager, or vocational program  Occupational History   Not on file  Tobacco Use   Smoking status: Never   Smokeless tobacco: Never  Vaping Use   Vaping Use: Never used  Substance and Sexual Activity   Alcohol use: No    Alcohol/week: 0.0 standard  drinks of alcohol   Drug use: No   Sexual activity: Not Currently    Partners: Male    Birth control/protection: Surgical    Comment: hysterectomy  Other Topics Concern   Not on file  Social History Narrative   Not on file   Social Determinants of Health   Financial Resource Strain: Low Risk  (02/09/2022)   Overall Financial Resource Strain (CARDIA)    Difficulty of Paying Living Expenses: Not hard at all  Food Insecurity: No Food Insecurity (02/09/2022)   Hunger Vital Sign    Worried About Running Out of Food in the Last Year: Never true    [place] in the Last Year: Never true  Transportation Needs: No Transportation Needs (02/09/2022)   PRAPARE - Hydrologist (Medical): No    Lack of Transportation (Non-Medical): No  Physical Activity: Sufficiently Active (02/09/2022)   Exercise Vital Sign    Days of Exercise per Week: 5 days    Minutes of Exercise per Session: 30 min  Stress: No Stress Concern Present (02/09/2022)   [place]    Feeling of Stress : Not at all  Social Connections: Moderately Integrated (02/09/2022)   Social Connection and Isolation Panel [NHANES]    Frequency of Communication with Friends and Family: More than three times a week    Frequency of Social Gatherings with Friends and Family: More than three times a week    Attends Religious Services: More than 4 times per year    Active Member of [employer] or Organizations: No    Attends Archivist Meetings: Never    Marital Status: Married   Allergies: Allergies  Allergen Reactions   Peanut Butter Flavor Itching and Swelling   Shellfish Allergy Itching and Swelling   Current Medications: Current Outpatient Medications on File Prior to Visit  Medication Sig Dispense Refill   EPINEPHrine 0.3 mg/0.3 mL IJ SOAJ injection Inject 0.3 mg into the muscle as needed for anaphylaxis. 2 each 1   valACYclovir  (VALTREX) 500 MG tablet TAKE 1 TABLET BY MOUTH TWICE DAILY FOR 3 DAYS AS NEEDED FOR SYMPTOMS 30 tablet 1   Semaglutide-Weight Management (WEGOVY) 2.4 MG/0.75ML SOAJ Inject 2.4 mg into the skin once a week. (Patient not taking: Reported on 11/21/2022) 9 mL 0   No current  facility-administered medications on file prior to visit.   Review of Systems General: no complaints  HEENT: no complaints  Lungs: no complaints  Cardiac: no complaints  GI: no complaints  GU: no complaints  GYN: deep dysparunia Musculoskeletal: no complaints  Extremities: no complaints  Skin: no complaints  Neuro: no complaints  Endocrine: no complaints  Psych: no complaints        Objective:  Physical Examination:  Today's Vitals   11/21/22 1539  BP: 125/88  Pulse: 71  Resp: 20  Temp: 97.8 F (36.6 C)  SpO2: 100%  Weight: 140 lb 4.8 oz (63.6 kg)    Body mass index is 25.66 kg/m.   ECOG Performance Status: 0 - Asymptomatic  GENERAL: Patient is a well appearing female in no acute distress HEENT:  Atraumatic and normocephalic. PERRL, neck supple. NODES:  No cervical, supraclavicular, axillary, or inguinal lymphadenopathy palpated.  LUNGS:  Normal respiratory effort  ABDOMEN:  Soft, nontender. Nondistended. No masses/ascites/hernia/or hepatomegaly.  EXTREMITIES:  No peripheral edema.   NEURO:  Nonfocal. Well oriented.  Appropriate affect.  Pelvic: chaperoned with RN EGBUS: no lesions Cervix: surgically absent Vagina: no lesions, no discharge or bleeding Uterus: surgically absent BME: no palpable masses Rectovaginal: deferred   Lab Review Inhibin A & B pending  Radiologic Imaging:  No imaging on site today.   Assessment:  Varetta Chavers Mcdevitt is a 47 y.o. 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  No other disease noted in the abdomen. Not fertility desiring. Now s/p TLH-LSO with biopsies, washings and  omentectomy on 06/13/22. Clinically NED  Inhibin levels normal post op.   Deep dysparunia   Plan:   Granulosa cell tumor of ovary, right  Dyspareunia in female  Previously the team discussed options for management including observation vs adjuvant chemotherapy.  She opted for observation.   We discussed alternating visits with Dr. Marcelline Mates.  She will reach out to make that appointment in the next 4 to 6 months. We will plan to see her back for follow up 8 months with inhibin levels. Thereafter we can alternative visits very 4-6 months. According to NCCN guidelines.  After 2 years he can extend to every 6 months until year 5 and then every 12 months.  Obtain imaging if she has elevated biomarkers, symptoms, or suspicious findings on physical exams.   Vaginal cuff now well healed.  We discussed her dyspareunia symptoms.  We reviewed the use of lubrication and different options for intercourse to identify the etiology of the symptoms and to reduce the extent of dyspareunia.  I personally saw the patient and performed a the entire face-to-face encounter, provided additional documentation, assessment and plan which was fully formulated by me. Beckey Rutter, NP scribed the note.   Lucette Kratz Gaetana Michaelis, MD

## 2022-11-21 NOTE — Patient Instructions (Signed)
Please see Dr. Marcelline Mates in 4 months and we will see you back in 8 months.

## 2022-11-23 ENCOUNTER — Other Ambulatory Visit: Payer: Self-pay

## 2022-11-23 LAB — INHIBIN B: Inhibin B: <7 pg/mL

## 2022-11-26 LAB — INHIBIN A: Inhibin-A: 0.7 pg/mL

## 2022-11-27 ENCOUNTER — Encounter: Payer: Self-pay | Admitting: Family Medicine

## 2022-11-27 ENCOUNTER — Ambulatory Visit (INDEPENDENT_AMBULATORY_CARE_PROVIDER_SITE_OTHER): Payer: 59 | Admitting: Family Medicine

## 2022-11-27 ENCOUNTER — Other Ambulatory Visit: Payer: Self-pay

## 2022-11-27 VITALS — BP 122/74 | HR 92 | Resp 16 | Ht 62.0 in | Wt 137.0 lb

## 2022-11-27 DIAGNOSIS — M545 Low back pain, unspecified: Secondary | ICD-10-CM

## 2022-11-27 DIAGNOSIS — E663 Overweight: Secondary | ICD-10-CM | POA: Diagnosis not present

## 2022-11-27 DIAGNOSIS — Z8639 Personal history of other endocrine, nutritional and metabolic disease: Secondary | ICD-10-CM

## 2022-11-27 MED ORDER — CELECOXIB 100 MG PO CAPS
100.0000 mg | ORAL_CAPSULE | Freq: Two times a day (BID) | ORAL | 0 refills | Status: AC
  Filled 2022-11-27: qty 60, 30d supply, fill #0

## 2022-11-27 MED ORDER — WEGOVY 2.4 MG/0.75ML ~~LOC~~ SOAJ
2.4000 mg | SUBCUTANEOUS | 0 refills | Status: DC
  Filled 2022-11-27: qty 9, 84d supply, fill #0

## 2022-11-27 MED ORDER — BACLOFEN 10 MG PO TABS
10.0000 mg | ORAL_TABLET | Freq: Three times a day (TID) | ORAL | 0 refills | Status: AC | PRN
  Filled 2022-11-27: qty 60, 20d supply, fill #0

## 2022-11-27 NOTE — Progress Notes (Signed)
Name: Lori Beltran   MRN: [id number]    DOB: 04-03-1976   Date:11/27/2022       Progress Note  Subjective  Chief Complaint  Low Back Pain  HPI  History of obesity:  she states her weight when she graduated HS was 120 lbs, She states after birth of her first child her weight stays around 150 lbs, she states after third she started to have problems staying around 150 lbs. She has tried [employer] but only lost 6 lbs,  She tried Contrave in the past but it caused nausea   Started Ozempic end of 06/2021 at a weight 185 lbs, she  lost down to 156 lbs, we switched to [facility] 02/2022 and today is 137 lbs, originally her goal weight 135 lbs but she wants to go down to 130 lbs and build muscle after that   Acute low back pain: she states she has intermittent pain that goes down right lower leg, only lasts a few seconds and goes from right buttocks to popliteal fossa. Over the past 4 days she has noticed pain on lumbar spine, constant, and dull and aching pain, sometimes sharp with movement . No bowel or bladder incontinence. No rashes. She states better with stretching and ibuprofen. She works at [employer] and sits most of the day.    Patient Active Problem List   Diagnosis Date Noted   Dyspareunia in female 11/21/2022   History of ovarian cancer 08/22/2022   Granulosa cell tumor of ovary, 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   Hemoperitoneum 03/29/2022   History of left knee surgery 03/08/2022   Pre-diabetes 03/08/2022   History of obesity 03/08/2022   Overweight (BMI 25.0-29.9) 03/08/2022   OSA on CPAP 07/20/2021   Primary osteoarthritis of both knees 01/31/2021   Angioedema 07/04/2018   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/2015   Chronic constipation 07/15/2015   History of iron deficiency anemia 07/15/2015   Migraine without aura and without status migrainosus, not intractable 05/04/2015   Environmental and seasonal allergies 05/04/2015   Plantar fasciitis of  left foot 05/04/2015   Vitamin D deficiency 05/04/2015   GERD (gastroesophageal reflux disease) 05/04/2015    Past Surgical History:  Procedure Laterality Date   COLONOSCOPY WITH PROPOFOL N/A 12/30/2020   Procedure: COLONOSCOPY WITH PROPOFOL;  Surgeon: Jonathon Bellows, MD;  Location: [facility] ENDOSCOPY;  Service: Gastroenterology;  Laterality: N/A;   COLPOSCOPY     ENDOMETRIAL ABLATION  2011   KNEE ARTHROSCOPY Left 11/23/2021   Procedure: ARTHROSCOPY KNEE, CHONDROPLASTY OF PATELLORFEMORAL JOINT, REMOVAL OF LOOSE BODIES;  Surgeon: Thornton Park, MD;  Location: [facility] ORS;  Service: Orthopedics;  Laterality: Left;   LAPAROSCOPIC HYSTERECTOMY N/A 06/13/2022   Procedure: HYSTERECTOMY TOTAL LAPAROSCOPIC, LEFT SALPINGO- OOPHORECTOMY, PERITONEAL BIOPSIES, AND OMENTECTOMY;  Surgeon: Mellody Drown, MD;  Location: [facility] ORS;  Service: Gynecology;  Laterality: N/A;   LAPAROSCOPY Right 03/29/2022   Procedure: LAPAROSCOPIC RIGHT SALPINGO-OOPHORECTOMY EVACUATION OF HEMOPERITONEUM ;  Surgeon: Rubie Maid, MD;  Location: [facility] ORS;  Service: Gynecology;  Laterality: Right;   LEEP     TUBAL LIGATION  2000    Family History  Problem Relation Age of Onset   Sleep apnea Mother    Sleep apnea Father    Allergic rhinitis Daughter    Allergic rhinitis Son    Breast cancer Paternal Grandmother 74   Brain cancer Paternal Grandmother    Breast cancer Maternal Aunt 80   Colon cancer Maternal Uncle  Lung cancer Maternal Uncle    Prostate cancer Maternal Uncle    Bone cancer Maternal Aunt     Social History   Tobacco Use   Smoking status: Never   Smokeless tobacco: Never  Substance Use Topics   Alcohol use: No    Alcohol/week: 0.0 standard drinks of alcohol     Current Outpatient Medications:    EPINEPHrine 0.3 mg/0.3 mL IJ SOAJ injection, Inject 0.3 mg into the muscle as needed for anaphylaxis., Disp: 2 each, Rfl: 1   valACYclovir (VALTREX) 500 MG tablet, TAKE 1 TABLET BY MOUTH TWICE DAILY FOR 3 DAYS  AS NEEDED FOR SYMPTOMS, Disp: 30 tablet, Rfl: 1   Semaglutide-Weight Management (WEGOVY) 2.4 MG/0.75ML SOAJ, Inject 2.4 mg into the skin once a week. (Patient not taking: Reported on 11/21/2022), Disp: 9 mL, Rfl: 0  Allergies  Allergen Reactions   Peanut Butter Flavor Itching and Swelling   Shellfish Allergy Itching and Swelling    I personally reviewed active problem list, medication list, allergies, family history, social history, health maintenance with the patient/caregiver today.   ROS  Constitutional: Negative for fever, positive for  weight change.  Respiratory: Negative for cough and shortness of breath.   Cardiovascular: Negative for chest pain or palpitations.  Gastrointestinal: Negative for abdominal pain, no bowel changes.  Musculoskeletal: positive  for gait problem but no  joint swelling.  Skin: Negative for rash.  Neurological: Negative for dizziness or headache.  No other specific complaints in a complete review of systems (except as listed in HPI above).   Objective  Vitals:   11/27/22 0747  BP: 122/74  Pulse: 92  Resp: 16  SpO2: 99%  Weight: 137 lb (62.1 kg)  Height: '5\' 2"'$  (1.575 m)    Body mass index is 25.06 kg/m.  Physical Exam  Constitutional: Patient appears well-developed and well-nourished.  No distress.  HEENT: head atraumatic, normocephalic, pupils equal and reactive to light, neck supple, throat within normal limits Cardiovascular: Normal rate, regular rhythm and normal heart sounds.  No murmur heard. No BLE edema. Pulmonary/Chest: Effort normal and breath sounds normal. No respiratory distress. Abdominal: Soft.  There is no tenderness. Muscular Skeletal pain during palpation of lumbar spine, negative straight leg raise, pain with rom of spine Psychiatric: Patient has a normal mood and affect. behavior is normal. Judgment and thought content normal.   Recent Results (from the past 2160 hour(s))  Inhibin B     Status: None   Collection Time:  11/21/22  3:26 PM  Result Value Ref Range   Inhibin B <7.0 pg/mL    Comment: (NOTE)                     Early Follicular           <824.2                     Late Follicular            <353.6                     Periovulatory              <189.0                     MidLuteal                  <164.0  End Luteal                 <107.0                     Post Menopausal            < 17.0 This test was developed and its performance characteristics determined by [facility]. It has not been cleared or approved by the Food and Drug Administration. Inhibin B performed by [employer]sh[employer](TM) Enzyme Linked Immunoassay methodology. Values obtained with different assay methods or kits cannot be used interchangeably. Performed At: [facility] [place], [place] [id number] Rush Farmer MD ST:[id number]   Inhibin A     Status: None   Collection Time: 11/21/22  3:26 PM  Result Value Ref Range   Inhibin-A 0.7 pg/mL    Comment: (NOTE)                       Menstrual Phase                         Early Follicular        <89.2                         Late Follicular         <11.9                         Periovulatory       8.0-233.0                         MidLuteal              <145.0                         End Luteal             <145.0                         Postmenopausal           <4.0 Inhibin A performed by [employer] Access Automated Immunoassay methodology. Values obtained with different assay methods or kits cannot be used interchangeably. Performed At: [facility] [place], [place] [id number] Rush Farmer MD YJ:[id number]     PHQ2/9:    11/27/2022    7:46 AM 08/22/2022    8:05 AM 08/01/2022    2:06 PM 06/20/2022    9:11 AM 03/29/2022    3:38 PM  Depression screen PHQ 2/9  Decreased Interest 0 0 0 0 0  Down, Depressed, Hopeless 0 0 0 0 0  PHQ - 2 Score 0 0 0 0 0  Altered sleeping 0 0 0    Tired, decreased energy 0  0 0    Change in appetite 0 0 0    Feeling bad or failure about yourself  0 0 0    Trouble concentrating 0 0 0    Moving slowly or fidgety/restless 0 0 0    Suicidal thoughts 0 0 0    PHQ-9 Score 0 0 0      phq 9 is negative   Fall Risk:    11/27/2022    7:46 AM 08/22/2022    8:05 AM 08/01/2022    2:06 PM 06/20/2022  9:11 AM 03/29/2022    3:38 PM  Fall Risk   Falls in the past year? 0 0 0 0 0  Number falls in past yr: 0  0 0 0  Injury with Fall? 0  0 0 0  Risk for fall due to : No Fall Risks No Fall Risks No Fall Risks  No Fall Risks  Follow up Falls prevention discussed Falls prevention discussed;Education provided;Falls evaluation completed Falls prevention discussed Falls evaluation completed Falls prevention discussed      Functional Status Survey: Is the patient deaf or have difficulty hearing?: No Does the patient have difficulty seeing, even when wearing glasses/contacts?: No Does the patient have difficulty concentrating, remembering, or making decisions?: No Does the patient have difficulty walking or climbing stairs?: No Does the patient have difficulty dressing or bathing?: No Does the patient have difficulty doing errands alone such as visiting a doctor's office or shopping?: No    Assessment & Plan  1. Overweight (BMI 25.0-29.9)  - Semaglutide-Weight Management (WEGOVY) 2.4 MG/0.75ML SOAJ; Inject 2.4 mg into the skin once a week.  Dispense: 9 mL; Refill: 0  2. History of obesity  - Semaglutide-Weight Management (WEGOVY) 2.4 MG/0.75ML SOAJ; Inject 2.4 mg into the skin once a week.  Dispense: 9 mL; Refill: 0  3. Acute bilateral low back pain without sciatica  - baclofen (LIORESAL) 10 MG tablet; Take 1 tablet (10 mg total) by mouth 3 (three) times daily as needed for muscle spasms.  Dispense: 60 each; Refill: 0 - celecoxib (CELEBREX) 100 MG capsule; Take 1 capsule (100 mg total) by mouth 2 (two) times daily.  Dispense: 60 capsule; Refill: 0

## 2022-12-07 ENCOUNTER — Ambulatory Visit
Admission: RE | Admit: 2022-12-07 | Discharge: 2022-12-07 | Disposition: A | Discharge status: Home / Self Care | Payer: 59 | Source: Ambulatory Visit | Attending: Family Medicine | Admitting: Family Medicine

## 2022-12-07 DIAGNOSIS — Z1231 Encounter for screening mammogram for malignant neoplasm of breast: Secondary | ICD-10-CM | POA: Diagnosis not present

## 2023-03-11 NOTE — Progress Notes (Unsigned)
PCP:  Alba Cory, MD   No chief complaint on file.    HPI:      [name] is a 47 y.o. No obstetric history on file. who LMP was Patient's last menstrual period was 10/27/2021 (approximate)., presents today for her annual examination.  Her menses are now monthly, lasting 5 days, light flow, no BTB, mild dysmen, improved with tylenol. Had been amenorrheic, s/p ablation 2011. S/p RSO 6/23 with Dr. 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 Ignacia Palma;    Sex activity: single partner, contraception - tubal ligation. No pain/bleeding.  Last Pap: 10/23/18  Results were: no abnormalities /neg HPV DNA Hx of STDs: HSV, takes valtrex prn--doesn't need Rx RF  Last mammo: 04/26/20 Results: no abnormalities, repeat in 12 months There is a FH of breast cancer in her mat aunt and PGM, genetic testing not indicated. There is no FH of ovarian cancer. The patient does do self-breast exams.  Tobacco use: The patient denies current or previous tobacco use. Alcohol use: none No drug use.  Exercise: mod active  Colonoscopy: 3/22 at [facility], repeat due after 10 yrs  She does get adequate calcium but not Vitamin D in her diet.  Labs with PCP.    Past Medical History:  Diagnosis Date   Arthritis    left knee   Cervical dysplasia    CINII   Constipation    GERD (gastroesophageal reflux disease)    Herpes    Migraine    Obesity    Pap smear abnormality of vagina with ASC-US    Plantar fasciitis    Pre-diabetes    Sleep apnea    Snoring    Vitamin D deficiency     Past Surgical History:  Procedure Laterality Date   COLONOSCOPY WITH PROPOFOL N/A 12/30/2020   Procedure: COLONOSCOPY WITH PROPOFOL;  Surgeon: Wyline Mood, MD;  Location: [facility] ENDOSCOPY;  Service: Gastroenterology;  Laterality: N/A;   COLPOSCOPY     ENDOMETRIAL ABLATION  2011   KNEE  ARTHROSCOPY Left 11/23/2021   Procedure: ARTHROSCOPY KNEE, CHONDROPLASTY OF PATELLORFEMORAL JOINT, REMOVAL OF LOOSE BODIES;  Surgeon: Juanell Fairly, MD;  Location: [facility] ORS;  Service: Orthopedics;  Laterality: Left;   LAPAROSCOPIC HYSTERECTOMY N/A 06/13/2022   Procedure: HYSTERECTOMY TOTAL LAPAROSCOPIC, LEFT SALPINGO- OOPHORECTOMY, PERITONEAL BIOPSIES, AND OMENTECTOMY;  Surgeon: Leida Lauth, MD;  Location: [facility] ORS;  Service: Gynecology;  Laterality: N/A;   LAPAROSCOPY Right 03/29/2022   Procedure: LAPAROSCOPIC RIGHT SALPINGO-OOPHORECTOMY EVACUATION OF HEMOPERITONEUM ;  Surgeon: Hildred Laser, MD;  Location: [facility] ORS;  Service: Gynecology;  Laterality: Right;   LEEP     TUBAL LIGATION  2000    Family History  Problem Relation Age of Onset   Sleep apnea Mother    Sleep apnea Father    Allergic rhinitis Daughter    Allergic rhinitis Son    Breast cancer Paternal Grandmother 41   Brain cancer Paternal Grandmother    Breast cancer Maternal Aunt 37   Colon cancer Maternal Uncle    Lung cancer Maternal Uncle    Prostate cancer Maternal Uncle    Bone cancer Maternal Aunt     Social History   Socioeconomic History   Marital status: Married    Spouse name: Thayer Ohm    Number of children: 3   Years of education: Not on file   Highest education level: Associate degree:  occupational, Scientist, product/process development, or vocational program  Occupational History   Not on file  Tobacco Use   Smoking status: Never   Smokeless tobacco: Never  Vaping Use   Vaping Use: Never used  Substance and Sexual Activity   Alcohol use: No    Alcohol/week: 0.0 standard drinks of alcohol   Drug use: No   Sexual activity: Not Currently    Partners: Male    Birth control/protection: Surgical    Comment: hysterectomy  Other Topics Concern   Not on file  Social History Narrative   Not on file   Social Determinants of Health   Financial Resource Strain: Low Risk  (02/09/2022)   Overall Financial Resource Strain (CARDIA)     Difficulty of Paying Living Expenses: Not hard at all  Food Insecurity: No Food Insecurity (02/09/2022)   Hunger Vital Sign    Worried About Running Out of Food in the Last Year: Never true    Ran Out of Food in the Last Year: Never true  Transportation Needs: No Transportation Needs (02/09/2022)   PRAPARE - Administrator, Civil Service (Medical): No    Lack of Transportation (Non-Medical): No  Physical Activity: Sufficiently Active (02/09/2022)   Exercise Vital Sign    Days of Exercise per Week: 5 days    Minutes of Exercise per Session: 30 min  Stress: No Stress Concern Present (02/09/2022)   [employer] of Occupational Health - Occupational Stress Questionnaire    Feeling of Stress : Not at all  Social Connections: Moderately Integrated (02/09/2022)   Social Connection and Isolation Panel [NHANES]    Frequency of Communication with Friends and Family: More than three times a week    Frequency of Social Gatherings with Friends and Family: More than three times a week    Attends Religious Services: More than 4 times per year    Active Member of [employer] or Organizations: No    Attends Banker Meetings: Never    Marital Status: Married  Catering manager Violence: Not At Risk (02/09/2022)   Humiliation, Afraid, Rape, and Kick questionnaire    Fear of Current or Ex-Partner: No    Emotionally Abused: No    Physically Abused: No    Sexually Abused: No    No outpatient medications have been marked as taking for the 03/12/23 encounter (Appointment) with Menachem Urbanek, Helmut Muster B, PA-C.     ROS:  Review of Systems  Constitutional:  Negative for fatigue, fever and unexpected weight change.  Respiratory:  Negative for cough, shortness of breath and wheezing.   Cardiovascular:  Negative for chest pain, palpitations and leg swelling.  Gastrointestinal:  Negative for blood in stool, constipation, diarrhea, nausea and vomiting.  Endocrine: Negative for cold intolerance,  heat intolerance and polyuria.  Genitourinary:  Negative for dyspareunia, dysuria, flank pain, frequency, genital sores, hematuria, menstrual problem, pelvic pain, urgency, vaginal bleeding, vaginal discharge and vaginal pain.  Musculoskeletal:  Negative for back pain, joint swelling and myalgias.  Skin:  Negative for rash.  Neurological:  Negative for dizziness, syncope, light-headedness, numbness and headaches.  Hematological:  Negative for adenopathy.  Psychiatric/Behavioral:  Negative for agitation, confusion, sleep disturbance and suicidal ideas. The patient is not nervous/anxious.      Objective: LMP 10/27/2021 (Approximate) Comment: tubal  Physical Exam Constitutional:      Appearance: She is well-developed.  Genitourinary:     Vulva normal.     Right Labia: No rash, tenderness or lesions.    Left Labia:  No tenderness, lesions or rash.    No vaginal discharge, erythema or tenderness.      Right Adnexa: not tender and no mass present.    Left Adnexa: not tender and no mass present.    No cervical friability or polyp.     Uterus is not enlarged or tender.  Breasts:    Right: No mass, nipple discharge, skin change or tenderness.     Left: No mass, nipple discharge, skin change or tenderness.  Neck:     Thyroid: No thyromegaly.  Cardiovascular:     Rate and Rhythm: Normal rate and regular rhythm.     Heart sounds: Normal heart sounds. No murmur heard. Pulmonary:     Effort: Pulmonary effort is normal.     Breath sounds: Normal breath sounds.  Abdominal:     Palpations: Abdomen is soft.     Tenderness: There is no abdominal tenderness. There is no guarding or rebound.  Musculoskeletal:        General: Normal range of motion.     Cervical back: Normal range of motion.  Lymphadenopathy:     Cervical: No cervical adenopathy.  Neurological:     General: No focal deficit present.     Mental Status: She is alert and oriented to person, place, and time.     Cranial Nerves:  No cranial nerve deficit.  Skin:    General: Skin is warm and dry.  Psychiatric:        Mood and Affect: Mood normal.        Behavior: Behavior normal.        Thought Content: Thought content normal.        Judgment: Judgment normal.  Vitals reviewed.     Assessment/Plan: Encounter for annual routine gynecological examination  Encounter for screening mammogram for malignant neoplasm of breast - Plan: MM 3D SCREEN BREAST BILATERAL; pt [facility]  Herpes simplex vulvovaginitis - Plan: valACYclovir (VALTREX) 500 MG tablet; Rx RF.   No orders of the defined types were placed in this encounter.          GYN counsel breast self exam, mammography screening, adequate intake of calcium and vitamin D, diet and exercise     F/U  No follow-ups on file.  Destynie Toomey B. [place] Liz, PA-C 03/11/2023 5:02 PM

## 2023-03-12 ENCOUNTER — Ambulatory Visit (INDEPENDENT_AMBULATORY_CARE_PROVIDER_SITE_OTHER): Payer: 59 | Admitting: Obstetrics and Gynecology

## 2023-03-12 ENCOUNTER — Other Ambulatory Visit: Payer: Self-pay

## 2023-03-12 ENCOUNTER — Encounter: Payer: Self-pay | Admitting: Obstetrics and Gynecology

## 2023-03-12 VITALS — BP 102/70 | Ht 62.0 in | Wt 138.0 lb

## 2023-03-12 DIAGNOSIS — A6004 Herpesviral vulvovaginitis: Secondary | ICD-10-CM | POA: Diagnosis not present

## 2023-03-12 DIAGNOSIS — D3911 Neoplasm of uncertain behavior of right ovary: Secondary | ICD-10-CM

## 2023-03-12 DIAGNOSIS — Z01411 Encounter for gynecological examination (general) (routine) with abnormal findings: Secondary | ICD-10-CM

## 2023-03-12 DIAGNOSIS — Z1231 Encounter for screening mammogram for malignant neoplasm of breast: Secondary | ICD-10-CM

## 2023-03-12 DIAGNOSIS — Z803 Family history of malignant neoplasm of breast: Secondary | ICD-10-CM

## 2023-03-12 DIAGNOSIS — Z01419 Encounter for gynecological examination (general) (routine) without abnormal findings: Secondary | ICD-10-CM

## 2023-03-12 MED ORDER — VALACYCLOVIR HCL 500 MG PO TABS
500.0000 mg | ORAL_TABLET | Freq: Every day | ORAL | 3 refills | Status: AC
Start: 2023-03-12 — End: ?
  Filled 2023-03-12: qty 90, 80d supply, fill #0
  Filled 2023-03-21: qty 90, 90d supply, fill #0
  Filled 2023-06-18: qty 90, 90d supply, fill #1

## 2023-03-12 MED ORDER — VALACYCLOVIR HCL 500 MG PO TABS
ORAL_TABLET | ORAL | 3 refills | Status: DC
Start: 2023-03-12 — End: 2023-03-12

## 2023-03-12 NOTE — Addendum Note (Signed)
Addended by: Althea Grimmer B on: 03/12/2023 03:35 PM   Modules accepted: Orders

## 2023-03-12 NOTE — Patient Instructions (Signed)
I value your feedback and you entrusting us with your care. If you get a [facility] patient survey, I would appreciate you taking the time to let us know about your experience today. Thank you! ? ? ?

## 2023-03-21 ENCOUNTER — Other Ambulatory Visit: Payer: Self-pay

## 2023-06-28 ENCOUNTER — Ambulatory Visit: Payer: Self-pay

## 2023-06-28 ENCOUNTER — Ambulatory Visit: Payer: 59 | Admitting: Nurse Practitioner

## 2023-06-28 NOTE — Progress Notes (Deleted)
LMP 10/27/2021 (Approximate) Comment: tubal   Subjective:    Patient ID: Lori Beltran, female    DOB: March 22, 1976, 47 y.o.   MRN: [id number]  HPI: Lori Beltran is a 47 y.o. female  No chief complaint on file.   Relevant past medical, surgical, family and social history reviewed and updated as indicated. Interim medical history since our last visit reviewed. Allergies and medications reviewed and updated.  Review of Systems  Constitutional: Negative for fever or weight change.  Respiratory: Negative for cough and shortness of breath.   Cardiovascular: Negative for chest pain or palpitations.  Gastrointestinal: Negative for abdominal pain, no bowel changes.  Musculoskeletal: Negative for gait problem or joint swelling.  Skin: Negative for rash.  Neurological: Negative for dizziness or headache.  No other specific complaints in a complete review of systems (except as listed in HPI above).      Objective:    LMP 10/27/2021 (Approximate) Comment: tubal  Wt Readings from Last 3 Encounters:  03/12/23 138 lb (62.6 kg)  11/27/22 137 lb (62.1 kg)  11/21/22 140 lb 4.8 oz (63.6 kg)    Physical Exam  Constitutional: Patient appears well-developed and well-nourished.  No distress.  HEENT: head atraumatic, normocephalic, pupils equal and reactive to light, neck supple Cardiovascular: Normal rate, regular rhythm and normal heart sounds.  No murmur heard. No BLE edema. Pulmonary/Chest: Effort normal and breath sounds normal. No respiratory distress. Abdominal: Soft.  There is no tenderness. Psychiatric: Patient has a normal mood and affect. behavior is normal. Judgment and thought content normal.      Assessment & Plan:   Problem List Items Addressed This Visit   None    Follow up plan: No follow-ups on file.

## 2023-06-28 NOTE — Telephone Encounter (Signed)
  Chief Complaint: Back pain - spine pain Symptoms: above Frequency: Since at least february Pertinent Negatives: Patient denies  Disposition: [] ED /[] Urgent Care (no appt availability in office) / [x] Appointment(In office/virtual)/ []  [place] [facility]/ [] Home Care/ [] Refused Recommended Disposition /[] [facility] [place] [facility]/ []  Follow-up with PCP Additional Notes: Call from pt's husband, Thayer Ohm. Thayer Ohm states that pt's back pain has not resolved. Pt was available as well. Pt states that she is taking medication prescribed for back pain when needed. Appt made for this afternoon.     Reason for Disposition  [1] MODERATE back pain (e.g., interferes with normal activities) AND [2] present > 3 days  Answer Assessment - Initial Assessment Questions 1. ONSET: "When did the pain begin?"      At least February 2. LOCATION: "Where does it hurt?" (upper, mid or lower back)     Lower back spine 3. SEVERITY: "How bad is the pain?"  (e.g., Scale 1-10; mild, moderate, or severe)   - MILD (1-3): Doesn't interfere with normal activities.    - MODERATE (4-7): Interferes with normal activities or awakens from sleep.    - SEVERE (8-10): Excruciating pain, unable to do any normal activities.      moderate  Protocols used: Back Pain-A-AH

## 2023-07-17 ENCOUNTER — Inpatient Hospital Stay: Payer: 59 | Attending: Oncology

## 2023-07-17 ENCOUNTER — Inpatient Hospital Stay: Payer: 59

## 2023-07-24 ENCOUNTER — Ambulatory Visit: Payer: 59

## 2023-07-24 ENCOUNTER — Other Ambulatory Visit: Payer: 59

## 2023-08-22 ENCOUNTER — Ambulatory Visit: Payer: 59 | Admitting: Internal Medicine

## 2023-08-22 ENCOUNTER — Other Ambulatory Visit: Payer: Self-pay

## 2023-08-22 ENCOUNTER — Encounter: Payer: Self-pay | Admitting: Internal Medicine

## 2023-08-22 VITALS — BP 124/70 | HR 76 | Temp 97.8°F | Resp 18 | Ht 62.0 in

## 2023-08-22 DIAGNOSIS — R351 Nocturia: Secondary | ICD-10-CM | POA: Diagnosis not present

## 2023-08-22 DIAGNOSIS — N3090 Cystitis, unspecified without hematuria: Secondary | ICD-10-CM

## 2023-08-22 LAB — POCT URINALYSIS DIPSTICK
Bilirubin, UA: NEGATIVE
Blood, UA: NEGATIVE
Glucose, UA: NEGATIVE
Ketones, UA: NEGATIVE
Nitrite, UA: POSITIVE
Protein, UA: NEGATIVE
Spec Grav, UA: 1.025 (ref 1.010–1.025)
Urobilinogen, UA: 0.2 U/dL
pH, UA: 6.5 (ref 5.0–8.0)

## 2023-08-22 MED ORDER — SULFAMETHOXAZOLE-TRIMETHOPRIM 800-160 MG PO TABS
1.0000 | ORAL_TABLET | Freq: Two times a day (BID) | ORAL | 0 refills | Status: AC
Start: 2023-08-22 — End: 2023-08-25
  Filled 2023-08-22: qty 6, 3d supply, fill #0

## 2023-08-22 NOTE — Progress Notes (Signed)
Acute Office Visit  Subjective:     Patient ID: Lori Beltran, female    DOB: 1976/03/07, 47 y.o.   MRN: [id number]  Chief Complaint  Patient presents with   Nocturia    HPI Patient is in today for urinary frequency, worse at night. Started about 3 months ago. Will have to stop drinking all liquids around 4 pm but will still have to get up and urinate 4-5 times a night. Denies dysuria, hematuria, pelvic/abdominal pain/pressure, flank pain or fevers. Will urinate a few times during the day but definitively worse at night. Denies incomplete emptying but has noticed a strong odor.   Review of Systems  Constitutional:  Negative for chills and fever.  Gastrointestinal:  Negative for abdominal pain.  Genitourinary:  Positive for frequency and urgency. Negative for dysuria, flank pain and hematuria.        Objective:    BP 124/70   Pulse 76   Temp 97.8 F (36.6 C)   Resp 18   Ht 5\' 2"  (1.575 m)   LMP 10/27/2021 (Approximate) Comment: tubal  SpO2 99%   BMI 25.24 kg/m  BP Readings from Last 3 Encounters:  08/22/23 124/70  03/12/23 102/70  11/27/22 122/74   Wt Readings from Last 3 Encounters:  03/12/23 138 lb (62.6 kg)  11/27/22 137 lb (62.1 kg)  11/21/22 140 lb 4.8 oz (63.6 kg)      Physical Exam Constitutional:      Appearance: Normal appearance.  HENT:     Head: Normocephalic and atraumatic.  Eyes:     Conjunctiva/sclera: Conjunctivae normal.  Cardiovascular:     Rate and Rhythm: Normal rate and regular rhythm.  Pulmonary:     Effort: Pulmonary effort is normal.     Breath sounds: Normal breath sounds.  Abdominal:     General: There is no distension.     Palpations: Abdomen is soft.     Tenderness: There is no abdominal tenderness. There is no right CVA tenderness or left CVA tenderness.  Skin:    General: Skin is warm and dry.  Neurological:     General: No focal deficit present.     Mental Status: She is alert. Mental status is at baseline.   Psychiatric:        Mood and Affect: Mood normal.        Behavior: Behavior normal.     Results for orders placed or performed in visit on 08/22/23  POCT urinalysis dipstick  Result Value Ref Range   Color, UA yellow    Clarity, UA cloudy    Glucose, UA Negative Negative   Bilirubin, UA neg    Ketones, UA neg    Spec Grav, UA 1.025 1.010 - 1.025   Blood, UA neg    pH, UA 6.5 5.0 - 8.0   Protein, UA Negative Negative   Urobilinogen, UA 0.2 0.2 or 1.0 E.U./dL   Nitrite, UA positive    Leukocytes, UA Moderate (2+) (A) Negative   Appearance cloudy    Odor foul         Assessment & Plan:   1. Cystitis/Nocturia: UA consistent with UTI, will treat with Bactrim x 3 days and send for culture. Overactive bladder may also be contributing to symptoms, patient will follow up if symptoms worsen or fail to improve after treatment.   - sulfamethoxazole-trimethoprim (BACTRIM DS) 800-160 MG tablet; Take 1 tablet by mouth 2 (two) times daily for 3 days.  Dispense: 6 tablet; Refill: 0 -  POCT urinalysis dipstick - Urine Culture   Return if symptoms worsen or fail to improve.  Margarita Mail, DO

## 2023-08-24 LAB — URINE CULTURE
MICRO NUMBER:: 15670364
SPECIMEN QUALITY:: ADEQUATE

## 2023-08-26 ENCOUNTER — Other Ambulatory Visit: Payer: Self-pay

## 2023-08-26 MED ORDER — NITROFURANTOIN MONOHYD MACRO 100 MG PO CAPS
100.0000 mg | ORAL_CAPSULE | Freq: Two times a day (BID) | ORAL | 0 refills | Status: AC
Start: 2023-08-26 — End: 2023-09-01
  Filled 2023-08-26: qty 10, 5d supply, fill #0

## 2023-08-26 NOTE — Addendum Note (Signed)
Addended by: Margarita Mail on: 08/26/2023 08:16 AM   Modules accepted: Orders

## 2023-08-29 ENCOUNTER — Other Ambulatory Visit: Payer: Self-pay

## 2023-12-27 ENCOUNTER — Other Ambulatory Visit: Payer: Self-pay | Admitting: Family Medicine

## 2023-12-27 DIAGNOSIS — Z1231 Encounter for screening mammogram for malignant neoplasm of breast: Secondary | ICD-10-CM

## 2024-01-06 ENCOUNTER — Ambulatory Visit
Admission: RE | Admit: 2024-01-06 | Discharge: 2024-01-06 | Disposition: A | Discharge status: Home / Self Care | Source: Ambulatory Visit | Attending: Family Medicine | Admitting: Family Medicine

## 2024-01-06 DIAGNOSIS — Z1231 Encounter for screening mammogram for malignant neoplasm of breast: Secondary | ICD-10-CM | POA: Diagnosis not present

## 2024-01-29 ENCOUNTER — Encounter: Payer: Self-pay | Admitting: Family Medicine

## 2024-01-29 ENCOUNTER — Ambulatory Visit (INDEPENDENT_AMBULATORY_CARE_PROVIDER_SITE_OTHER): Admitting: Family Medicine

## 2024-01-29 ENCOUNTER — Other Ambulatory Visit: Payer: Self-pay

## 2024-01-29 VITALS — BP 118/74 | HR 92 | Resp 16 | Ht 62.0 in | Wt 166.4 lb

## 2024-01-29 DIAGNOSIS — J3089 Other allergic rhinitis: Secondary | ICD-10-CM | POA: Diagnosis not present

## 2024-01-29 DIAGNOSIS — J040 Acute laryngitis: Secondary | ICD-10-CM | POA: Diagnosis not present

## 2024-01-29 DIAGNOSIS — H9203 Otalgia, bilateral: Secondary | ICD-10-CM | POA: Diagnosis not present

## 2024-01-29 DIAGNOSIS — J069 Acute upper respiratory infection, unspecified: Secondary | ICD-10-CM | POA: Diagnosis not present

## 2024-01-29 DIAGNOSIS — R051 Acute cough: Secondary | ICD-10-CM | POA: Diagnosis not present

## 2024-01-29 DIAGNOSIS — J029 Acute pharyngitis, unspecified: Secondary | ICD-10-CM

## 2024-01-29 LAB — POCT RAPID STREP A (OFFICE): Rapid Strep A Screen: NEGATIVE

## 2024-01-29 MED ORDER — FLUTICASONE PROPIONATE 50 MCG/ACT NA SUSP
2.0000 | Freq: Every day | NASAL | 0 refills | Status: DC
  Filled 2024-01-29: qty 16, 30d supply, fill #0

## 2024-01-29 MED ORDER — PREDNISONE 10 MG PO TABS
10.0000 mg | ORAL_TABLET | Freq: Two times a day (BID) | ORAL | 0 refills | Status: DC
Start: 2024-01-29 — End: 2024-02-05
  Filled 2024-01-29: qty 10, 5d supply, fill #0

## 2024-01-29 MED ORDER — BENZONATATE 100 MG PO CAPS
100.0000 mg | ORAL_CAPSULE | Freq: Three times a day (TID) | ORAL | 0 refills | Status: DC | PRN
  Filled 2024-01-29: qty 40, 7d supply, fill #0

## 2024-01-29 MED ORDER — HYDROCOD POLI-CHLORPHE POLI ER 10-8 MG/5ML PO SUER
5.0000 mL | Freq: Every evening | ORAL | 0 refills | Status: DC | PRN
Start: 2024-01-29 — End: 2024-02-05
  Filled 2024-01-29: qty 115, 23d supply, fill #0

## 2024-01-29 NOTE — Progress Notes (Signed)
 Name: Lori Beltran   MRN: [id number]    DOB: 07-24-1976   Date:01/29/2024       Progress Note  Subjective  Chief Complaint  Chief Complaint  Patient presents with   Ear Pain   Sore Throat    Since Sunday   Cough   Nasal Congestion    Drainage     Discussed the use of AI scribe software for clinical note transcription with the patient, who gave verbal consent to proceed.  History of Present Illness Lori Beltran is a 48 year old female who presents with upper respiratory symptoms including congestion, cough, and ear pain.  She began experiencing upper respiratory symptoms on Saturday after spending time outside. By Saturday afternoon, she felt drained, and by late Saturday night, she developed congestion. On Sunday, she experienced a stuffy nose, a little cough, and throat irritation. No fever or chills. She feels tired and drained. Her appetite has been normal.  She reports ear pain, primarily in the left ear, and hoarseness that started on Sunday morning. She experiences nighttime coughing and notices 'crazy noises' when waking up at night, which she attributes to drainage in the back of her throat. The cough is described as wet, and she sometimes coughs up phlegm. No wheezing or shortness of breath.  She has a history of seasonal allergies and is allergic to shellfish. No year-round allergies or asthma. She takes Zyrtec and Xyzal for allergies, with Zyrtec being taken at night due to its sedative effects.    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/2015   Chronic constipation 07/15/2015   History of iron deficiency anemia 07/15/2015   Migraine without aura and without status migrainosus, not intractable 05/04/2015   Environmental and seasonal allergies 05/04/2015   Plantar fasciitis of left foot 05/04/2015   Vitamin D deficiency 05/04/2015   GERD (gastroesophageal reflux disease) 05/04/2015    Social History   Tobacco Use   Smoking status: Never   Smokeless tobacco: Never  Substance Use Topics   Alcohol use: No    Alcohol/week: 0.0 standard drinks of alcohol     Current Outpatient Medications:    baclofen (LIORESAL) 10 MG tablet, Take 1 tablet (10 mg total) by mouth 3 (three) times daily as needed for muscle spasms., Disp: 60 each, Rfl: 0   celecoxib (CELEBREX) 100 MG capsule, Take 1 capsule (100 mg total) by mouth 2 (two) times daily., Disp: 60 capsule, Rfl: 0   EPINEPHrine 0.3 mg/0.3 mL IJ SOAJ injection, Inject 0.3 mg into the muscle as needed for anaphylaxis., Disp: 2 each, Rfl: 1   valACYclovir (VALTREX) 500 MG tablet, Take 1 tablet (500 mg total) by mouth daily or two times daily for 3 days as needed for symptoms, Disp: 90 tablet, Rfl: 3  Allergies  Allergen Reactions   Peanut Butter Flavoring Agent (Non-Screening) Itching and Swelling   Shellfish Allergy Itching and Swelling    ROS  Ten systems reviewed and is negative except as mentioned in  HPI    Objective  Vitals:   01/29/24 1329  BP: 118/74  Pulse: 92  Resp: 16  SpO2: 96%  Weight: 166 lb 6.4 oz (75.5 kg)  Height: 5\' 2"  (1.575 m)    Body mass index is 30.43 kg/m.  Physical Exam CONSTITUTIONAL: Patient appears well-developed and well-nourished. No distress. HEENT: Head atraumatic, normocephalic, neck supple. Pharynx erythematous. Tonsils small. Cerumen present in right ear canal . Tympanic membranes normal. No sinus tenderness. Cervical lymphadenopathy present. CARDIOVASCULAR: Normal  rate, regular rhythm and normal heart sounds. No murmur heard. No BLE edema. PULMONARY: Effort normal and breath sounds normal. No respiratory distress. Lungs clear to auscultation bilaterally. ABDOMINAL: There is no tenderness or distention. MUSCULOSKELETAL: Normal gait. Without gross motor or sensory deficit. PSYCHIATRIC: Patient has a normal mood and affect. Behavior is normal. Judgment and thought content normal.  Assessment & Plan Upper Respiratory Tract Infection Symptoms suggest viral infection with congestion, cough, hoarseness, and ear pain. Negative strep test. Likely exacerbated by allergies. Ear pain due to eustachian tube dysfunction, not bacterial infection. No antibiotics unless symptoms persist beyond Friday. - Prescribed prednisone twice daily with food to reduce inflammation, hoarseness, and fatigue. - Prescribed Tessalon Perles for daytime cough management, one or two as needed. - Recommended Tussionex  for nighttime cough to aid sleep. - Advised Flonase nasal spray for ear pressure and congestion. - Instructed to continue Zyrtec or Xyzal for allergies, with dose adjustment during high pollen counts. - Advised to contact office if no improvement by Friday for potential antibiotics.  Seasonal Allergies Allergies may contribute to symptoms. Currently managed with Zyrtec and Xyzal. - Continue Zyrtec or Xyzal, adjust dose during high pollen counts.

## 2024-02-05 ENCOUNTER — Encounter: Payer: Self-pay | Admitting: Family Medicine

## 2024-02-05 ENCOUNTER — Other Ambulatory Visit: Payer: Self-pay

## 2024-02-05 ENCOUNTER — Telehealth: Admitting: Family Medicine

## 2024-02-05 VITALS — BP 118/72 | HR 82 | Temp 98.4°F | Resp 18 | Ht 62.0 in | Wt 166.0 lb

## 2024-02-05 DIAGNOSIS — N390 Urinary tract infection, site not specified: Secondary | ICD-10-CM | POA: Diagnosis not present

## 2024-02-05 DIAGNOSIS — R300 Dysuria: Secondary | ICD-10-CM

## 2024-02-05 DIAGNOSIS — Z1612 Extended spectrum beta lactamase (ESBL) resistance: Secondary | ICD-10-CM | POA: Diagnosis not present

## 2024-02-05 DIAGNOSIS — R8290 Unspecified abnormal findings in urine: Secondary | ICD-10-CM

## 2024-02-05 DIAGNOSIS — A499 Bacterial infection, unspecified: Secondary | ICD-10-CM | POA: Diagnosis not present

## 2024-02-05 LAB — POCT URINALYSIS DIPSTICK
Bilirubin, UA: NEGATIVE
Glucose, UA: NEGATIVE
Ketones, UA: NEGATIVE
Nitrite, UA: POSITIVE
Protein, UA: NEGATIVE
Spec Grav, UA: 1.02 (ref 1.010–1.025)
Urobilinogen, UA: 0.2 U/dL
pH, UA: 5 (ref 5.0–8.0)

## 2024-02-05 MED ORDER — PREMARIN 0.625 MG/GM VA CREA
TOPICAL_CREAM | VAGINAL | 12 refills | Status: DC
  Filled 2024-02-05: qty 30, 18d supply, fill #0

## 2024-02-05 MED ORDER — CEPHALEXIN 500 MG PO CAPS
500.0000 mg | ORAL_CAPSULE | Freq: Two times a day (BID) | ORAL | 0 refills | Status: DC
  Filled 2024-02-05: qty 10, 5d supply, fill #0

## 2024-02-05 NOTE — Progress Notes (Addendum)
 Patient ID: Lori Beltran, female    DOB: January 25, 1976, 48 y.o.   MRN: [id number]  PCP: Arleen Lacer, MD  Chief Complaint  Patient presents with   Urinary Tract Infection    Onset 3 days w/ foul/strong urine smell.  Positive urine dip   I connected with  Marton Sleeper  on 02/17/24 at  4:00 PM EDT by a video enabled telemedicine application and verified that I am speaking with the correct person using two identifiers.  I discussed the limitations of evaluation and management by telemedicine and the availability of in person appointments. The patient expressed understanding and agreed to proceed. Staff also discussed with the patient that there may be a patient responsible charge related to this service. Patient Location: in clinic then had to leave in private vehicle Provider Location: [facility] clinic office Additional Individuals present: none  Subjective:   Lori Beltran is a 48 y.o. female, presents to clinic with CC of the following:  HPI  Pt presents with urinary odor and cloudiness x a few days, has hx of recurrent UTI and seems to have more freq as "getting older"  Last UTI was culture positive with some resistance She is s/p  TLH-LSO 06/13/2022 and postmenopausal since roughly Jan 2023 No abd pain, flank pain, dysuria or hematuria Results for orders placed or performed in visit on 02/05/24  POCT Urinalysis Dipstick   Collection Time: 02/05/24  4:08 PM  Result Value Ref Range   Color, UA yellow    Clarity, UA clody    Glucose, UA Negative Negative   Bilirubin, UA neg    Ketones, UA neg    Spec Grav, UA 1.020 1.010 - 1.025   Blood, UA trace    pH, UA 5.0 5.0 - 8.0   Protein, UA Negative Negative   Urobilinogen, UA 0.2 0.2 or 1.0 E.U./dL   Nitrite, UA positive    Leukocytes, UA Large (3+) (A) Negative   Appearance     Odor       Patient Active Problem List   Diagnosis Date Noted   Dyspareunia in female 11/21/2022   History of ovarian cancer 08/22/2022    Granulosa cell tumor of ovary, 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   Hemoperitoneum 03/29/2022   History of left knee surgery 03/08/2022   Pre-diabetes 03/08/2022   History of obesity 03/08/2022   Overweight (BMI 25.0-29.9) 03/08/2022   OSA on CPAP 07/20/2021   Primary osteoarthritis of both knees 01/31/2021   Angioedema 07/04/2018   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/2015   Chronic constipation 07/15/2015   History of iron deficiency anemia 07/15/2015   Migraine without aura and without status migrainosus, not intractable 05/04/2015   Environmental and seasonal allergies 05/04/2015   Plantar fasciitis of left foot 05/04/2015   Vitamin D  deficiency 05/04/2015   GERD (gastroesophageal reflux disease) 05/04/2015      Current Outpatient Medications:    baclofen  (LIORESAL ) 10 MG tablet, Take 1 tablet (10 mg total) by mouth 3 (three) times daily as needed for muscle spasms., Disp: 60 each, Rfl: 0   celecoxib  (CELEBREX ) 100 MG capsule, Take 1 capsule (100 mg total) by mouth 2 (two) times daily., Disp: 60 capsule, Rfl: 0   EPINEPHrine  0.3 mg/0.3 mL IJ SOAJ injection, Inject 0.3 mg into the muscle as needed for anaphylaxis., Disp: 2 each, Rfl: 1   fluticasone  (FLONASE ) 50 MCG/ACT nasal spray, Place 2 sprays into both nostrils daily., Disp: 16  g, Rfl: 0   valACYclovir  (VALTREX ) 500 MG tablet, Take 1 tablet (500 mg total) by mouth daily or two times daily for 3 days as needed for symptoms, Disp: 90 tablet, Rfl: 3   Allergies  Allergen Reactions   Peanut Butter Flavoring Agent (Non-Screening) Itching and Swelling   Shellfish Allergy  Itching and Swelling     Social History   Tobacco Use   Smoking status: Never   Smokeless tobacco: Never  Vaping Use   Vaping status: Never Used  Substance Use Topics   Alcohol use: No    Alcohol/week: 0.0 standard drinks of alcohol   Drug use: No      Chart Review Today: I personally reviewed active problem list,  medication list, allergies, family history, social history, health maintenance, notes from last encounter, lab results, imaging with the patient/caregiver today.   Review of Systems  Constitutional: Negative.   HENT: Negative.    Eyes: Negative.   Respiratory: Negative.    Cardiovascular: Negative.   Gastrointestinal: Negative.   Endocrine: Negative.   Genitourinary: Negative.   Musculoskeletal: Negative.   Skin: Negative.   Allergic/Immunologic: Negative.   Neurological: Negative.   Hematological: Negative.   Psychiatric/Behavioral: Negative.    All other systems reviewed and are negative.      Objective:   Vitals:   02/05/24 1605  BP: 118/72  Pulse: 82  Resp: 18  Temp: 98.4 F (36.9 C)  SpO2: 98%  Weight: 166 lb (75.3 kg)  Height: 5\' 2"  (1.575 m)    Body mass index is 30.36 kg/m.  Physical Exam Vitals and nursing note reviewed.  Constitutional:      General: She is not in acute distress.    Appearance: She is not ill-appearing, toxic-appearing or diaphoretic.  HENT:     Head: Normocephalic and atraumatic.     Right Ear: External ear normal.     Left Ear: External ear normal.  Cardiovascular:     Rate and Rhythm: Normal rate.  Pulmonary:     Effort: No respiratory distress.  Neurological:     Mental Status: She is alert.     Gait: Gait normal.  Psychiatric:        Mood and Affect: Mood normal.        Behavior: Behavior normal.     Exam limited by virtual encounter   Results for orders placed or performed in visit on 02/05/24  POCT Urinalysis Dipstick   Collection Time: 02/05/24  4:08 PM  Result Value Ref Range   Color, UA yellow    Clarity, UA clody    Glucose, UA Negative Negative   Bilirubin, UA neg    Ketones, UA neg    Spec Grav, UA 1.020 1.010 - 1.025   Blood, UA trace    pH, UA 5.0 5.0 - 8.0   Protein, UA Negative Negative   Urobilinogen, UA 0.2 0.2 or 1.0 E.U./dL   Nitrite, UA positive    Leukocytes, UA Large (3+) (A) Negative    Appearance     Odor         Assessment & Plan:   1. Burning with urination (Primary) Odor/cloudy, she denies dysuria but dx was put in and associated with DIP Dip + She will provider urine tomorrow in office and we will send for culture - POCT Urinalysis Dipstick - Urine Culture  2. Bad odor of urine Se#1 - POCT Urinalysis Dipstick  3. Recurrent UTI More frequent - discussed tx with vaginal estrogen creams which  is shown to reduce UTI in postmenopausal women, no contraindications, if still having freq/recurrent UTI recommend urology or urogyn eval - Urine Culture - conjugated estrogens  (PREMARIN ) vaginal cream; Place 1 Applicatorful vaginally at bedtime x 2 weeks, then do twice weekly for recurrent UTI/atrophic vaginitis symptoms  Dispense: 42.5 g; Refill: 12   will start empiric tx with keflex  and follow culture Pt well appearing w/o pain, stable VS, no constitutional sx  -Red flags and when to present for emergency care or RTC including chest pain, shortness of breath, new/worsening/un-resolving symptoms, reviewed with patient at time of visit. Follow up and care instructions discussed and provided in AVS. - I discussed the assessment and treatment plan with the patient. The patient was provided an opportunity to ask questions and all were answered. The patient agreed with the plan and demonstrated an understanding of the instructions.   I provided 18+ minutes of non-face-to-face time during this encounter.    Adeline Hone, PA-C 02/05/24 4:10 PM

## 2024-02-06 ENCOUNTER — Other Ambulatory Visit: Payer: Self-pay

## 2024-02-06 DIAGNOSIS — R300 Dysuria: Secondary | ICD-10-CM | POA: Diagnosis not present

## 2024-02-06 DIAGNOSIS — N390 Urinary tract infection, site not specified: Secondary | ICD-10-CM | POA: Diagnosis not present

## 2024-02-08 LAB — URINE CULTURE
MICRO NUMBER:: 16343998
SPECIMEN QUALITY:: ADEQUATE

## 2024-02-11 ENCOUNTER — Encounter: Payer: Self-pay | Admitting: Family Medicine

## 2024-02-11 ENCOUNTER — Other Ambulatory Visit: Payer: Self-pay

## 2024-02-11 DIAGNOSIS — A499 Bacterial infection, unspecified: Secondary | ICD-10-CM | POA: Insufficient documentation

## 2024-02-11 DIAGNOSIS — N390 Urinary tract infection, site not specified: Secondary | ICD-10-CM | POA: Insufficient documentation

## 2024-02-11 MED ORDER — ESTRADIOL 0.1 MG/GM VA CREA
TOPICAL_CREAM | VAGINAL | 12 refills | Status: AC
  Filled 2024-02-11: qty 42.5, 30d supply, fill #0

## 2024-02-11 MED ORDER — CIPROFLOXACIN HCL 500 MG PO TABS
500.0000 mg | ORAL_TABLET | Freq: Two times a day (BID) | ORAL | 0 refills | Status: AC
  Filled 2024-02-11: qty 6, 3d supply, fill #0

## 2024-02-11 NOTE — Addendum Note (Signed)
 Addended by: Aurianna Earlywine on: 02/11/2024 05:01 PM   Modules accepted: Orders

## 2024-02-14 ENCOUNTER — Other Ambulatory Visit: Payer: Self-pay

## 2024-05-21 ENCOUNTER — Ambulatory Visit (INDEPENDENT_AMBULATORY_CARE_PROVIDER_SITE_OTHER): Admitting: Family Medicine

## 2024-05-21 ENCOUNTER — Encounter: Payer: Self-pay | Admitting: Family Medicine

## 2024-05-21 VITALS — BP 126/76 | HR 91 | Resp 16 | Ht 61.13 in | Wt 175.9 lb

## 2024-05-21 DIAGNOSIS — Z79899 Other long term (current) drug therapy: Secondary | ICD-10-CM

## 2024-05-21 DIAGNOSIS — R7303 Prediabetes: Secondary | ICD-10-CM

## 2024-05-21 DIAGNOSIS — Z862 Personal history of diseases of the blood and blood-forming organs and certain disorders involving the immune mechanism: Secondary | ICD-10-CM

## 2024-05-21 DIAGNOSIS — Z13220 Encounter for screening for lipoid disorders: Secondary | ICD-10-CM | POA: Diagnosis not present

## 2024-05-21 DIAGNOSIS — E559 Vitamin D deficiency, unspecified: Secondary | ICD-10-CM | POA: Diagnosis not present

## 2024-05-21 DIAGNOSIS — Z124 Encounter for screening for malignant neoplasm of cervix: Secondary | ICD-10-CM

## 2024-05-21 DIAGNOSIS — Z0000 Encounter for general adult medical examination without abnormal findings: Secondary | ICD-10-CM

## 2024-05-21 LAB — LIPID PANEL
Cholesterol: 173 mg/dL (ref <200)
HDL: 53 mg/dL (ref >=50)
LDL Cholesterol (Calc): 96 mg/dL
Non-HDL Cholesterol (Calc): 120 mg/dL (ref <130)
Total CHOL/HDL Ratio: 3.3 (calc) (ref <5.0)
Triglycerides: 143 mg/dL (ref <150)

## 2024-05-21 LAB — CBC WITH DIFFERENTIAL/PLATELET
Absolute Lymphocytes: 2328 {cells}/uL (ref 850–3900)
Absolute Monocytes: 462 {cells}/uL (ref 200–950)
Basophils Absolute: 90 {cells}/uL (ref 0–200)
Basophils Relative: 1.5 %
Eosinophils Absolute: 198 {cells}/uL (ref 15–500)
Eosinophils Relative: 3.3 %
HCT: 38.3 % (ref 35.0–45.0)
Hemoglobin: 11.9 g/dL (ref 11.7–15.5)
MCH: 27.2 pg (ref 27.0–33.0)
MCHC: 31.1 g/dL — ABNORMAL LOW (ref 32.0–36.0)
MCV: 87.4 fL (ref 80.0–100.0)
MPV: 10.1 fL (ref 7.5–12.5)
Monocytes Relative: 7.7 %
Neutro Abs: 2922 {cells}/uL (ref 1500–7800)
Neutrophils Relative %: 48.7 %
Platelets: 409 Thousand/uL — ABNORMAL HIGH (ref 140–400)
RBC: 4.38 Million/uL (ref 3.80–5.10)
RDW: 14.7 % (ref 11.0–15.0)
Total Lymphocyte: 38.8 %
WBC: 6 Thousand/uL (ref 3.8–10.8)

## 2024-05-21 LAB — COMPREHENSIVE METABOLIC PANEL WITH GFR
AG Ratio: 1.9 (calc) (ref 1.0–2.5)
ALT: 11 U/L (ref 6–29)
AST: 12 U/L (ref 10–35)
Albumin: 4.3 g/dL (ref 3.6–5.1)
Alkaline phosphatase (APISO): 85 U/L (ref 37–153)
BUN: 9 mg/dL (ref 7–25)
CO2: 29 mmol/L (ref 20–32)
Calcium: 9.8 mg/dL (ref 8.6–10.4)
Chloride: 106 mmol/L (ref 98–110)
Creat: 0.83 mg/dL (ref 0.50–1.03)
Globulin: 2.3 g/dL (ref 1.9–3.7)
Glucose, Bld: 82 mg/dL (ref 65–99)
Potassium: 4.3 mmol/L (ref 3.5–5.3)
Sodium: 141 mmol/L (ref 135–146)
Total Bilirubin: 0.5 mg/dL (ref 0.2–1.2)
Total Protein: 6.6 g/dL (ref 6.1–8.1)
eGFR: 86 mL/min/1.73m2 (ref >=60)

## 2024-05-21 LAB — HEMOGLOBIN A1C
Hgb A1c MFr Bld: 5.9 % — ABNORMAL HIGH (ref <5.7)
Mean Plasma Glucose: 123 mg/dL
eAG (mmol/L): 6.8 mmol/L

## 2024-05-21 LAB — VITAMIN D 25 HYDROXY (VIT D DEFICIENCY, FRACTURES): Vit D, 25-Hydroxy: 24 ng/mL — ABNORMAL LOW (ref 30–100)

## 2024-05-21 NOTE — Patient Instructions (Signed)
 Preventive Care 16-50 Years Old, Female  Preventive care refers to lifestyle choices and visits with your health care provider that can promote health and wellness. Preventive care visits are also called wellness exams.  What can I expect for my preventive care visit?  Counseling  Your health care provider may ask you questions about your:  Medical history, including:  Past medical problems.  Family medical history.  Pregnancy history.  Current health, including:  Menstrual cycle.  Method of birth control.  Emotional well-being.  Home life and relationship well-being.  Sexual activity and sexual health.  Lifestyle, including:  Alcohol, nicotine or tobacco, and drug use.  Access to firearms.  Diet, exercise, and sleep habits.  Work and work Astronomer.  Sunscreen use.  Safety issues such as seatbelt and bike helmet use.  Physical exam  Your health care provider will check your:  Height and weight. These may be used to calculate your BMI (body mass index). BMI is a measurement that tells if you are at a healthy weight.  Waist circumference. This measures the distance around your waistline. This measurement also tells if you are at a healthy weight and may help predict your risk of certain diseases, such as type 2 diabetes and high blood pressure.  Heart rate and blood pressure.  Body temperature.  Skin for abnormal spots.  What immunizations do I need?    Vaccines are usually given at various ages, according to a schedule. Your health care provider will recommend vaccines for you based on your age, medical history, and lifestyle or other factors, such as travel or where you work.  What tests do I need?  Screening  Your health care provider may recommend screening tests for certain conditions. This may include:  Lipid and cholesterol levels.  Diabetes screening. This is done by checking your blood sugar (glucose) after you have not eaten for a while (fasting).  Pelvic exam and Pap test.  Hepatitis B test.  Hepatitis C  test.  HIV (human immunodeficiency virus) test.  STI (sexually transmitted infection) testing, if you are at risk.  Lung cancer screening.  Colorectal cancer screening.  Mammogram. Talk with your health care provider about when you should start having regular mammograms. This may depend on whether you have a family history of breast cancer.  BRCA-related cancer screening. This may be done if you have a family history of breast, ovarian, tubal, or peritoneal cancers.  Bone density scan. This is done to screen for osteoporosis.  Talk with your health care provider about your test results, treatment options, and if necessary, the need for more tests.  Follow these instructions at home:  Eating and drinking    Eat a diet that includes fresh fruits and vegetables, whole grains, lean protein, and low-fat dairy products.  Take vitamin and mineral supplements as recommended by your health care provider.  Do not drink alcohol if:  Your health care provider tells you not to drink.  You are pregnant, may be pregnant, or are planning to become pregnant.  If you drink alcohol:  Limit how much you have to 0-1 drink a day.  Know how much alcohol is in your drink. In the [place] one drink equals one 12 oz bottle of beer (355 mL), one 5 oz glass of wine (148 mL), or one 1 oz glass of hard liquor (44 mL).  Lifestyle  Brush your teeth every morning and night with fluoride toothpaste. Floss one time each day.  Exercise for at least  30 minutes 5 or more days each week.  Do not use any products that contain nicotine or tobacco. These products include cigarettes, chewing tobacco, and vaping devices, such as e-cigarettes. If you need help quitting, ask your health care provider.  Do not use drugs.  If you are sexually active, practice safe sex. Use a condom or other form of protection to prevent STIs.  If you do not wish to become pregnant, use a form of birth control. If you plan to become pregnant, see your health care provider for a  prepregnancy visit.  Take aspirin only as told by your health care provider. Make sure that you understand how much to take and what form to take. Work with your health care provider to find out whether it is safe and beneficial for you to take aspirin daily.  Find healthy ways to manage stress, such as:  Meditation, yoga, or listening to music.  Journaling.  Talking to a trusted person.  Spending time with friends and family.  Minimize exposure to UV radiation to reduce your risk of skin cancer.  Safety  Always wear your seat belt while driving or riding in a vehicle.  Do not drive:  If you have been drinking alcohol. Do not ride with someone who has been drinking.  When you are tired or distracted.  While texting.  If you have been using any mind-altering substances or drugs.  Wear a helmet and other protective equipment during sports activities.  If you have firearms in your house, make sure you follow all gun safety procedures.  Seek help if you have been physically or sexually abused.  What's next?  Visit your health care provider once a year for an annual wellness visit.  Ask your health care provider how often you should have your eyes and teeth checked.  Stay up to date on all vaccines.  This information is not intended to replace advice given to you by your health care provider. Make sure you discuss any questions you have with your health care provider.  Document Revised: 04/05/2021 Document Reviewed: 04/05/2021  Elsevier Patient Education  2024 [employer].

## 2024-05-21 NOTE — Progress Notes (Signed)
 Name: Lori Beltran   MRN: [id number]    DOB: [birth year]   Date:05/21/2024       Progress Note  Subjective  Chief Complaint  Chief Complaint  Patient presents with   Annual Exam    HPI  Patient presents for annual CPE.  Diet: not very healthy lately, craving sweets  Exercise: she has been walking during lunch break for about 30 minutes but too hot  Last Eye Exam: completed Last Dental Exam: completed  Flowsheet Row Office Visit from 05/21/2024 in [facility]  AUDIT-C Score 0   Depression: Phq 9 is  negative    05/21/2024    8:10 AM 01/29/2024    1:27 PM 08/22/2023    9:00 AM 11/27/2022    7:46 AM 08/22/2022    8:05 AM  Depression screen PHQ 2/9  Decreased Interest 0 0 0 0 0  Down, Depressed, Hopeless 0 0 0 0 0  PHQ - 2 Score 0 0 0 0 0  Altered sleeping  0 0 0 0  Tired, decreased energy  0 0 0 0  Change in appetite  0 0 0 0  Feeling bad or failure about yourself   0 0 0 0  Trouble concentrating  0 0 0 0  Moving slowly or fidgety/restless  0 0 0 0  Suicidal thoughts  0 0 0 0  PHQ-9 Score  0 0 0 0  Difficult doing work/chores  Not difficult at all Not difficult at all     Hypertension: BP Readings from Last 3 Encounters:  05/21/24 126/76  02/05/24 118/72  01/29/24 118/74   Obesity: Wt Readings from Last 3 Encounters:  05/21/24 175 lb 14.4 oz (79.8 kg)  02/05/24 166 lb (75.3 kg)  01/29/24 166 lb 6.4 oz (75.5 kg)   BMI Readings from Last 3 Encounters:  05/21/24 33.10 kg/m  02/05/24 30.36 kg/m  01/29/24 30.43 kg/m     Vaccines: reviewed with the patient.   Hep C Screening: completed STD testing and prevention (HIV/chl/gon/syphilis): N/A Intimate partner violence: negative screen  Sexual History : no pain during sex Menstrual History/LMP/Abnormal Bleeding: s/p hysterectomy  Discussed importance of follow up if any post-menopausal bleeding: not applicable  Incontinence Symptoms: negative for symptoms   Breast cancer:  - Last  Mammogram: up to date - BRCA gene screening:   Osteoporosis Prevention : Discussed high calcium and vitamin D  supplementation, weight bearing exercises Bone density :not applicable   Cervical cancer screening: not applicable due to hysterectomy  Skin cancer: Discussed monitoring for atypical lesions  Colorectal cancer: repeat in 2032   Lung cancer:  Low Dose CT Chest recommended if Age 93-80 years, 20 pack-year currently smoking OR have quit w/in 15years. Patient does not qualify for screen   ECG: next follow up  Advanced Care Planning: A voluntary discussion about advance care planning including the explanation and discussion of advance directives.  Discussed health care proxy and Living will, and the patient was able to identify a health care proxy as [place] and mother .  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   Hemoperitoneum 03/29/2022   History of left knee surgery 03/08/2022  Pre-diabetes 03/08/2022   History of obesity 03/08/2022   Overweight (BMI 25.0-29.9) 03/08/2022   OSA on CPAP 07/20/2021   Primary osteoarthritis of both knees 01/31/2021   Angioedema 07/04/2018   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/2015   Chronic constipation 07/15/2015   History of iron deficiency anemia 07/15/2015   Migraine without aura and without status migrainosus, not intractable 05/04/2015   Environmental and seasonal allergies 05/04/2015   Plantar fasciitis of left foot 05/04/2015   Vitamin D  deficiency 05/04/2015   GERD (gastroesophageal reflux disease) 05/04/2015    Past Surgical History:  Procedure Laterality Date   COLONOSCOPY  WITH PROPOFOL  N/A 12/30/2020   Procedure: COLONOSCOPY WITH PROPOFOL ;  Surgeon: Therisa Bi, MD;  Location: [facility] ENDOSCOPY;  Service: Gastroenterology;  Laterality: N/A;   COLPOSCOPY     ENDOMETRIAL ABLATION  2011   KNEE ARTHROSCOPY Left 11/23/2021   Procedure: ARTHROSCOPY KNEE, CHONDROPLASTY OF PATELLORFEMORAL JOINT, REMOVAL OF LOOSE BODIES;  Surgeon: Marchia Drivers, MD;  Location: [facility] ORS;  Service: Orthopedics;  Laterality: Left;   LAPAROSCOPIC HYSTERECTOMY N/A 06/13/2022   Procedure: HYSTERECTOMY TOTAL LAPAROSCOPIC, LEFT SALPINGO- OOPHORECTOMY, PERITONEAL BIOPSIES, AND OMENTECTOMY;  Surgeon: Mancil Barter, MD;  Location: [facility] ORS;  Service: Gynecology;  Laterality: N/A;   LAPAROSCOPY Right 03/29/2022   Procedure: LAPAROSCOPIC RIGHT SALPINGO-OOPHORECTOMY EVACUATION OF HEMOPERITONEUM ;  Surgeon: Connell Davies, MD;  Location: [facility] ORS;  Service: Gynecology;  Laterality: Right;   LEEP     TUBAL LIGATION  2000    Family History  Problem Relation Age of Onset   Sleep apnea Mother    Sleep apnea Father    Allergic rhinitis Daughter    Allergic rhinitis Son    Breast cancer Maternal Aunt 63   Bone cancer Maternal Aunt    Pancreatic cancer Maternal Aunt        unsure of age   Colon cancer Maternal Uncle    Lung cancer Maternal Uncle    Prostate cancer Maternal Uncle    Breast cancer Paternal Grandmother 95   Brain cancer Paternal Grandmother     Social History   Socioeconomic History   Marital status: Married    Spouse name: [place]    Number of children: 3   Years of education: Not on file   Highest education level: Associate degree: occupational, Scientist, product/process development, or vocational program  Occupational History   Not on file  Tobacco Use   Smoking status: Never   Smokeless tobacco: Never  Vaping Use   Vaping status: Never Used  Substance and Sexual Activity   Alcohol use: No    Alcohol/week: 0.0 standard drinks of alcohol   Drug use: No   Sexual activity: Yes    Partners: Male     Birth control/protection: Surgical    Comment: hysterectomy  Other Topics Concern   Not on file  Social History Narrative   Not on file   Social Drivers of Health   Financial Resource Strain: Low Risk  (05/21/2024)   Overall Financial Resource Strain (CARDIA)    Difficulty of Paying Living Expenses: Not hard at all  Food Insecurity: No Food Insecurity (05/21/2024)   Hunger Vital Sign    Worried About Running Out of Food in the Last Year: Never true    Ran Out of Food in the Last Year: Never true  Transportation Needs: No Transportation Needs (05/21/2024)   PRAPARE - Transportation    Lack of Transportation (Medical): No    Lack of  Transportation (Non-Medical): No  Physical Activity: Sufficiently Active (05/21/2024)   Exercise Vital Sign    Days of Exercise per Week: 5 days    Minutes of Exercise per Session: 30 min  Stress: No Stress Concern Present (05/21/2024)   [employer] of Occupational Health - Occupational Stress Questionnaire    Feeling of Stress: Not at all  Social Connections: Moderately Integrated (05/21/2024)   Social Connection and Isolation Panel    Frequency of Communication with Friends and Family: More than three times a week    Frequency of Social Gatherings with Friends and Family: More than three times a week    Attends Religious Services: More than 4 times per year    Active Member of [employer] or Organizations: No    Attends Banker Meetings: Never    Marital Status: Married  Catering manager Violence: Not At Risk (05/21/2024)   Humiliation, Afraid, Rape, and Kick questionnaire    Fear of Current or Ex-Partner: No    Emotionally Abused: No    Physically Abused: No    Sexually Abused: No     Current Outpatient Medications:    baclofen  (LIORESAL ) 10 MG tablet, Take 1 tablet (10 mg total) by mouth 3 (three) times daily as needed for muscle spasms., Disp: 60 each, Rfl: 0   celecoxib  (CELEBREX ) 100 MG capsule, Take 1 capsule (100 mg total) by  mouth 2 (two) times daily., Disp: 60 capsule, Rfl: 0   EPINEPHrine  0.3 mg/0.3 mL IJ SOAJ injection, Inject 0.3 mg into the muscle as needed for anaphylaxis., Disp: 2 each, Rfl: 1   fluticasone  (FLONASE ) 50 MCG/ACT nasal spray, Place 2 sprays into both nostrils daily., Disp: 16 g, Rfl: 0   valACYclovir  (VALTREX ) 500 MG tablet, Take 1 tablet (500 mg total) by mouth daily or two times daily for 3 days as needed for symptoms, Disp: 90 tablet, Rfl: 3   estradiol  (ESTRACE ) 0.1 MG/GM vaginal cream, Place 1 Applicatorful vaginally at bedtime for 14 days, THEN 1 Applicatorful 3 (three) times a week. (Patient not taking: No sig reported), Disp: 42.5 g, Rfl: 12  Allergies  Allergen Reactions   Peanut Butter Flavoring Agent (Non-Screening) Itching and Swelling   Shellfish Allergy  Itching and Swelling     ROS  Constitutional: Negative for fever or weight change.  Respiratory: Negative for cough and shortness of breath.   Cardiovascular: Negative for chest pain or palpitations.  Gastrointestinal: Negative for abdominal pain, no bowel changes.  Musculoskeletal: Negative for gait problem or joint swelling.  Skin: Negative for rash.  Neurological: Negative for dizziness or headache.  No other specific complaints in a complete review of systems (except as listed in HPI above).   Objective  Vitals:   05/21/24 0814  BP: 126/76  Pulse: 91  Resp: 16  SpO2: 96%  Weight: 175 lb 14.4 oz (79.8 kg)  Height: 5' 1.13 (1.553 m)    Body mass index is 33.1 kg/m.  Physical Exam  Constitutional: Patient appears well-developed and well-nourished. Obesity No distress.  HENT: Head: Normocephalic and atraumatic. Ears: B TMs ok, no erythema or effusion; Nose: Nose normal. Mouth/Throat: Oropharynx is clear and moist. No oropharyngeal exudate.  Eyes: Conjunctivae and EOM are normal. Pupils are equal, round, and reactive to light. No scleral icterus.  Neck: Normal range of motion. Neck supple. No JVD present. No  thyromegaly present.  Cardiovascular: Normal rate, regular rhythm and normal heart sounds.  No murmur heard. No BLE edema. Pulmonary/Chest: Effort normal and breath sounds  normal. No respiratory distress. Abdominal: Soft. Bowel sounds are normal, no distension. There is no tenderness. no masses Breast: no lumps or masses, no nipple discharge or rashes FEMALE GENITALIA:  Not done  RECTAL: not done  Musculoskeletal: Normal range of motion, no joint effusions. No gross deformities Neurological: he is alert and oriented to person, place, and time. No cranial nerve deficit. Coordination, balance, strength, speech and gait are normal.  Skin: Skin is warm and dry. No rash noted. No erythema.  Psychiatric: Patient has a normal mood and affect. behavior is normal. Judgment and thought content normal.     Assessment & Plan  1. Well adult exam (Primary)  - Lipid panel - CBC with Differential/Platelet - Comprehensive metabolic panel with GFR - Hemoglobin A1c - VITAMIN D  25 Hydroxy (Vit-D Deficiency, Fractures)  2. Pre-diabetes  - Hemoglobin A1c  3. Vitamin D  deficiency  - VITAMIN D  25 Hydroxy (Vit-D Deficiency, Fractures)  4. Lipid screening  - Lipid panel  5. History of iron deficiency anemia  - CBC with Differential/Platelet  6. Long-term use of high-risk medication  - CBC with Differential/Platelet - Comprehensive metabolic panel with GFR   -USPSTF grade A and B recommendations reviewed with patient; age-appropriate recommendations, preventive care, screening tests, etc discussed and encouraged; healthy living encouraged; see AVS for patient education given to patient -Discussed importance of 150 minutes of physical activity weekly, eat two servings of fish weekly, eat one serving of tree nuts ( cashews, pistachios, pecans, almonds.SABRA) every other day, eat 6 servings of fruit/vegetables daily and drink plenty of water and avoid sweet beverages.   -Reviewed Health Maintenance:  Yes.

## 2024-05-22 ENCOUNTER — Ambulatory Visit: Payer: Self-pay | Admitting: Family Medicine

## 2024-10-13 DIAGNOSIS — H5213 Myopia, bilateral: Secondary | ICD-10-CM | POA: Diagnosis not present

## 2024-11-05 ENCOUNTER — Other Ambulatory Visit: Payer: Self-pay

## 2024-11-05 ENCOUNTER — Emergency Department
Admission: EM | Admit: 2024-11-05 | Discharge: 2024-11-05 | Disposition: A | Discharge status: Home / Self Care | Attending: Emergency Medicine | Admitting: Emergency Medicine

## 2024-11-05 ENCOUNTER — Emergency Department

## 2024-11-05 DIAGNOSIS — R42 Dizziness and giddiness: Secondary | ICD-10-CM | POA: Diagnosis present

## 2024-11-05 DIAGNOSIS — G43809 Other migraine, not intractable, without status migrainosus: Secondary | ICD-10-CM | POA: Insufficient documentation

## 2024-11-05 DIAGNOSIS — R0981 Nasal congestion: Secondary | ICD-10-CM | POA: Insufficient documentation

## 2024-11-05 DIAGNOSIS — R519 Headache, unspecified: Secondary | ICD-10-CM

## 2024-11-05 DIAGNOSIS — G43801 Other migraine, not intractable, with status migrainosus: Secondary | ICD-10-CM

## 2024-11-05 LAB — CBC
HCT: 37.5 % (ref 36.0–46.0)
Hemoglobin: 11.9 g/dL — ABNORMAL LOW (ref 12.0–15.0)
MCH: 27.3 pg (ref 26.0–34.0)
MCHC: 31.7 g/dL (ref 30.0–36.0)
MCV: 86 fL (ref 80.0–100.0)
Platelets: 417 K/uL — ABNORMAL HIGH (ref 150–400)
RBC: 4.36 MIL/uL (ref 3.87–5.11)
RDW: 15 % (ref 11.5–15.5)
WBC: 6.8 K/uL (ref 4.0–10.5)
nRBC: 0 % (ref 0.0–0.2)

## 2024-11-05 LAB — BASIC METABOLIC PANEL WITH GFR
Anion gap: 9 (ref 5–15)
BUN: 9 mg/dL (ref 6–20)
CO2: 26 mmol/L (ref 22–32)
Calcium: 9.5 mg/dL (ref 8.9–10.3)
Chloride: 107 mmol/L (ref 98–111)
Creatinine, Ser: 0.72 mg/dL (ref 0.44–1.00)
GFR, Estimated: >60 mL/min
Glucose, Bld: 98 mg/dL (ref 70–99)
Potassium: 4.1 mmol/L (ref 3.5–5.1)
Sodium: 143 mmol/L (ref 135–145)

## 2024-11-05 LAB — RESP PANEL BY RT-PCR (RSV, FLU A&B, COVID)  RVPGX2
Influenza A by PCR: NEGATIVE
Influenza B by PCR: NEGATIVE
Resp Syncytial Virus by PCR: NEGATIVE
SARS Coronavirus 2 by RT PCR: NEGATIVE

## 2024-11-05 MED ORDER — PROCHLORPERAZINE EDISYLATE 10 MG/2ML IJ SOLN
10.0000 mg | INTRAMUSCULAR | Status: AC
  Administered 2024-11-05: 10 mg via INTRAVENOUS
  Filled 2024-11-05: qty 2

## 2024-11-05 MED ORDER — GADOBUTROL 1 MMOL/ML IV SOLN
7.5000 mL | Freq: Once | INTRAVENOUS | Status: AC | PRN
  Administered 2024-11-05: 7.5 mL via INTRAVENOUS

## 2024-11-05 MED ORDER — MECLIZINE HCL 25 MG PO TABS
25.0000 mg | ORAL_TABLET | Freq: Once | ORAL | Status: AC
  Administered 2024-11-05: 25 mg via ORAL
  Filled 2024-11-05: qty 1

## 2024-11-05 MED ORDER — PROCHLORPERAZINE MALEATE 10 MG PO TABS
10.0000 mg | ORAL_TABLET | Freq: Four times a day (QID) | ORAL | 0 refills | Status: AC | PRN
  Filled 2024-11-05: qty 30, 8d supply, fill #0

## 2024-11-05 MED ORDER — KETOROLAC TROMETHAMINE 15 MG/ML IJ SOLN
15.0000 mg | Freq: Once | INTRAMUSCULAR | Status: AC
  Administered 2024-11-05: 15 mg via INTRAVENOUS
  Filled 2024-11-05: qty 1

## 2024-11-05 MED ORDER — METOCLOPRAMIDE HCL 5 MG/ML IJ SOLN
10.0000 mg | INTRAMUSCULAR | Status: AC
  Administered 2024-11-05: 10 mg via INTRAVENOUS
  Filled 2024-11-05: qty 2

## 2024-11-05 MED ORDER — SODIUM CHLORIDE 0.9 % IV BOLUS
1000.0000 mL | Freq: Once | INTRAVENOUS | Status: AC
  Administered 2024-11-05: 1000 mL via INTRAVENOUS

## 2024-11-05 MED ORDER — DEXAMETHASONE SOD PHOSPHATE PF 10 MG/ML IJ SOLN
10.0000 mg | Freq: Once | INTRAMUSCULAR | Status: AC
  Administered 2024-11-05: 10 mg via INTRAVENOUS
  Filled 2024-11-05: qty 1

## 2024-11-05 MED ORDER — MECLIZINE HCL 25 MG PO TABS
25.0000 mg | ORAL_TABLET | Freq: Three times a day (TID) | ORAL | 1 refills | Status: AC | PRN
  Filled 2024-11-05: qty 30, 10d supply, fill #0

## 2024-11-05 NOTE — ED Triage Notes (Signed)
 Pt to ED [facility] from home for dizziness started this am. C/o congestion and dryness. +h/a

## 2024-11-05 NOTE — ED Provider Notes (Addendum)
 "  [facility] Provider Note    Event Date/Time   First MD Initiated Contact with Patient 11/05/24 [phone]     (approximate)   History   Chief Complaint: Dizziness   HPI  Lori Beltran is a 49 y.o. female with a history of GERD, migraines, obesity who comes ED complaining of generalized headache and vertigo that was present when she woke up this morning.  Felt normal last night at bedtime.  Does report that she has been having nasal congestion over the past week and last night used a nasal spray to try and help with that.  Denies fever, chest pain shortness of breath or neck pain.  No neck stiffness.  Endorses photophobia        Past Medical History:  Diagnosis Date   Arthritis    left knee   Cervical dysplasia    CINII   Constipation    GERD (gastroesophageal reflux disease)    Herpes    Migraine    Obesity    Pap smear abnormality of vagina with ASC-US     Plantar fasciitis    Pre-diabetes    Sleep apnea    Snoring    Vitamin D  deficiency     Current Outpatient Rx   Order #: [id number] Class: Normal   Order #: [id number] Class: Normal   Order #: [id number] Class: Normal   Reflex Order#: [id number] (Nmi#:[id number])Rojdd: Normal   Order #: [id number] Class: Normal   Order #: [id number] Class: Normal   Order #: [id number] Class: Normal   Order #: [id number] Class: Normal    Past Surgical History:  Procedure Laterality Date   COLONOSCOPY WITH PROPOFOL  N/A 12/30/2020   Procedure: COLONOSCOPY WITH PROPOFOL ;  Surgeon: Therisa Bi, MD;  Location: [facility] ENDOSCOPY;  Service: Gastroenterology;  Laterality: N/A;   COLPOSCOPY     ENDOMETRIAL ABLATION  2011   KNEE ARTHROSCOPY Left 11/23/2021   Procedure: ARTHROSCOPY KNEE, CHONDROPLASTY OF PATELLORFEMORAL JOINT, REMOVAL OF LOOSE BODIES;  Surgeon: Marchia Drivers, MD;  Location: [facility] ORS;  Service: Orthopedics;  Laterality: Left;   LAPAROSCOPIC HYSTERECTOMY N/A 06/13/2022   Procedure: HYSTERECTOMY TOTAL  LAPAROSCOPIC, LEFT SALPINGO- OOPHORECTOMY, PERITONEAL BIOPSIES, AND OMENTECTOMY;  Surgeon: Mancil Barter, MD;  Location: [facility] ORS;  Service: Gynecology;  Laterality: N/A;   LAPAROSCOPY Right 03/29/2022   Procedure: LAPAROSCOPIC RIGHT SALPINGO-OOPHORECTOMY EVACUATION OF HEMOPERITONEUM ;  Surgeon: Connell Davies, MD;  Location: [facility] ORS;  Service: Gynecology;  Laterality: Right;   LEEP     TUBAL LIGATION  2000    Physical Exam   Triage Vital Signs: ED Triage Vitals  Encounter Vitals Group     BP 11/05/24 0725 137/88     Girls Systolic BP Percentile --      Girls Diastolic BP Percentile --      Boys Systolic BP Percentile --      Boys Diastolic BP Percentile --      Pulse Rate 11/05/24 0725 67     Resp 11/05/24 0725 16     Temp 11/05/24 0725 98.1 F (36.7 C)     Temp src --      SpO2 11/05/24 0725 97 %     Weight 11/05/24 0724 175 lb (79.4 kg)     Height 11/05/24 0724 5' 1 (1.549 m)     Head Circumference --      Peak Flow --      Pain Score 11/05/24 0724 0     Pain Loc --      Pain Education --  Exclude from Growth Chart --     Most recent vital signs: Vitals:   11/05/24 1200 11/05/24 1201  BP:  92/72  Pulse:  68  Resp: 18 18  Temp: 97.7 F (36.5 C) 97.7 F (36.5 C)  SpO2:  97%    General: Awake, no distress.  CV:  Good peripheral perfusion.  Regular rate rhythm Resp:  Normal effort.  Clear lungs Abd:  No distention. Soft nontender Other:  No meningismus.  Cranial nerves III through XII intact.   ED Results / Procedures / Treatments   Labs (all labs ordered are listed, but only abnormal results are displayed) Labs Reviewed  CBC - Abnormal; Notable for the following components:      Result Value   Hemoglobin 11.9 (*)    Platelets 417 (*)    All other components within normal limits  RESP PANEL BY RT-PCR (RSV, FLU A&B, COVID)  [id number]  BASIC METABOLIC PANEL WITH GFR     EKG Interpreted by me Normal sinus rhythm rate of 66.  Normal axis intervals  QRS ST segments T waves   RADIOLOGY CT head interpreted by me, unremarkable.  Radiology report reviewed   PROCEDURES:  Procedures   MEDICATIONS ORDERED IN ED: Medications  sodium chloride  0.9 % bolus 1,000 mL (1,000 mLs Intravenous New Bag/Given 11/05/24 0905)  metoCLOPramide  (REGLAN ) injection 10 mg (10 mg Intravenous Given 11/05/24 0905)  ketorolac  (TORADOL ) 15 MG/ML injection 15 mg (15 mg Intravenous Given 11/05/24 0905)  meclizine  (ANTIVERT ) tablet 25 mg (25 mg Oral Given 11/05/24 0925)  dexamethasone  (DECADRON ) injection 10 mg (10 mg Intravenous Given 11/05/24 1203)  prochlorperazine  (COMPAZINE ) injection 10 mg (10 mg Intravenous Given 11/05/24 1203)  gadobutrol  (GADAVIST ) 1 MMOL/ML injection 7.5 mL (7.5 mLs Intravenous Contrast Given 11/05/24 1149)     IMPRESSION / MDM / ASSESSMENT AND PLAN / ED COURSE  I reviewed the triage vital signs and the nursing notes.  DDx: Intracranial hemorrhage, posterior stroke, migraine syndrome, COVID, influenza, AKI  Patient's presentation is most consistent with acute presentation with potential threat to life or bodily function.  Patient presents with generalized headache and vertigo, most likely migraine precipitated by use of vasoconstrictor spray nasally last night.  She is having vertigo which is atypical for her migraine syndrome.  CT head unremarkable.  Will obtain MRI/MRV, continue IV fluids and medications for supportive care.   ----------------------------------------- 12:51 PM on 11/05/2024 ----------------------------------------- MRI brain unremarkable.  Starting to feel better.  Tolerating oral intake.  Stable for discharge, continue migraine management at home.      FINAL CLINICAL IMPRESSION(S) / ED DIAGNOSES   Final diagnoses:  Vertigo  Other migraine with status migrainosus, not intractable     Rx / DC Orders   ED Discharge Orders          Ordered    prochlorperazine  (COMPAZINE ) 10 MG tablet  Every 6 hours PRN         11/05/24 1250    meclizine  (ANTIVERT ) 25 MG tablet  3 times daily PRN        11/05/24 1250             Note:  This document was prepared using Dragon voice recognition software and may include unintentional dictation errors.   Viviann Pastor, MD 11/05/24 1251    Viviann Pastor, MD 11/05/24 1251  "

## 2024-11-10 ENCOUNTER — Other Ambulatory Visit: Payer: Self-pay

## 2024-11-10 ENCOUNTER — Ambulatory Visit: Admitting: Family Medicine

## 2024-11-10 ENCOUNTER — Encounter: Payer: Self-pay | Admitting: Family Medicine

## 2024-11-10 VITALS — BP 114/80 | HR 86 | Resp 16 | Ht 61.0 in | Wt 180.8 lb

## 2024-11-10 DIAGNOSIS — H9203 Otalgia, bilateral: Secondary | ICD-10-CM

## 2024-11-10 DIAGNOSIS — G43011 Migraine without aura, intractable, with status migrainosus: Secondary | ICD-10-CM

## 2024-11-10 DIAGNOSIS — R42 Dizziness and giddiness: Secondary | ICD-10-CM | POA: Diagnosis not present

## 2024-11-10 MED ORDER — FLUTICASONE PROPIONATE 50 MCG/ACT NA SUSP
2.0000 | Freq: Every day | NASAL | 2 refills | Status: AC
  Filled 2024-11-10: qty 16, 30d supply, fill #0

## 2024-11-10 MED ORDER — QULIPTA 30 MG PO TABS
1.0000 | ORAL_TABLET | Freq: Every day | ORAL | 0 refills | Status: AC
  Filled 2024-11-10: qty 90, 90d supply, fill #0

## 2024-11-10 MED ORDER — UBRELVY 100 MG PO TABS
1.0000 | ORAL_TABLET | Freq: Every day | ORAL | 0 refills | Status: AC | PRN
  Filled 2024-11-10 – 2024-11-13 (×3): qty 16, 16d supply, fill #0

## 2024-11-10 MED ORDER — RIZATRIPTAN BENZOATE 10 MG PO TBDP
10.0000 mg | ORAL_TABLET | ORAL | 0 refills | Status: AC | PRN
  Filled 2024-11-10: qty 10, 30d supply, fill #0

## 2024-11-10 MED ORDER — TOPIRAMATE 50 MG PO TABS
50.0000 mg | ORAL_TABLET | Freq: Every evening | ORAL | 0 refills | Status: AC
  Filled 2024-11-10: qty 90, 90d supply, fill #0

## 2024-11-10 NOTE — Progress Notes (Signed)
 Name: Lori Beltran   MRN: [id number]    DOB: 1976-03-26   Date:11/10/2024       Progress Note  Subjective  Chief Complaint  Chief Complaint  Patient presents with   Dizziness    Seen at ER on 11/05/24, continues feeling dizzy depending how she moves/ certain noises   Discussed the use of AI scribe software for clinical note transcription with the patient, who gave verbal consent to proceed.  History of Present Illness Lori Beltran is a 49 year old female who presents with vertigo and headache.  She experienced an episode of vertigo on Thursday morning upon getting out of bed, initially feeling 'something's off' when taking her first steps, followed by a spinning sensation when sitting down in the bathroom. During this episode, she was unable to respond to her daughter, despite hearing her.  She was transported to the emergency room by EMS due to her symptoms. Upon arrival, she developed a headache. In the emergency room, she underwent blood work, a CT scan, and an MRI of the brain. She was treated with IV fluids, ketorolac  for pain, and medications for nausea including prochlorperazine  and metoclopramide .  Since the ER visit, she continues to experience headaches and vertigo. Loud sounds and certain movements exacerbate her symptoms, making it difficult for her to drive. No hearing loss or tinnitus. She has a history of recurrent headaches but has not been on migraine-specific medication recently, using Excedrin instead. She previously stopped migraine medication due to side effects but cannot recall the name of the medication ( it was years ago)   Her nasal passages are very dry, and she has used saline spray for relief. No new symptoms or changes in her health otherwise.    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/2015   Chronic constipation 07/15/2015   History of iron deficiency anemia 07/15/2015   Migraine without aura and without status migrainosus, not intractable 05/04/2015   Environmental and seasonal allergies 05/04/2015   Plantar fasciitis of left foot 05/04/2015   Vitamin D  deficiency 05/04/2015   GERD (gastroesophageal reflux disease) 05/04/2015    Past Surgical History:  Procedure Laterality Date   COLONOSCOPY WITH PROPOFOL  N/A 12/30/2020   Procedure: COLONOSCOPY WITH PROPOFOL ;  Surgeon: Therisa Bi, MD;  Location: [facility] ENDOSCOPY;  Service: Gastroenterology;  Laterality: N/A;   COLPOSCOPY     ENDOMETRIAL ABLATION  2011   KNEE ARTHROSCOPY Left 11/23/2021   Procedure: ARTHROSCOPY KNEE, CHONDROPLASTY OF PATELLORFEMORAL JOINT, REMOVAL OF LOOSE BODIES;  Surgeon: Marchia Drivers, MD;  Location: [facility] ORS;  Service: Orthopedics;  Laterality: Left;   LAPAROSCOPIC HYSTERECTOMY N/A 06/13/2022   Procedure: HYSTERECTOMY TOTAL LAPAROSCOPIC, LEFT SALPINGO- OOPHORECTOMY, PERITONEAL BIOPSIES, AND OMENTECTOMY;  Surgeon: Mancil Barter, MD;  Location: [facility] ORS;  Service: Gynecology;  Laterality: N/A;   LAPAROSCOPY Right 03/29/2022   Procedure: LAPAROSCOPIC RIGHT SALPINGO-OOPHORECTOMY EVACUATION OF HEMOPERITONEUM ;  Surgeon: Connell Davies, MD;  Location: [facility] ORS;  Service: Gynecology;  Laterality: Right;   LEEP     TUBAL LIGATION  2000    Family History  Problem Relation Age of Onset   Sleep apnea Mother    Sleep apnea Father    Allergic rhinitis Daughter    Allergic  rhinitis Son    Breast cancer Maternal Aunt 59   Bone cancer Maternal Aunt    Pancreatic cancer Maternal Aunt        unsure of age   Colon cancer Maternal Uncle    Lung cancer Maternal Uncle    Prostate cancer Maternal Uncle    Breast cancer Paternal Grandmother 63   Brain cancer Paternal Grandmother     Social History   Tobacco Use   Smoking status: Never   Smokeless tobacco: Never  Substance Use Topics   Alcohol use: No    Alcohol/week: 0.0 standard drinks of alcohol    Current Medications[1]  Allergies[2]  I personally reviewed active problem list, medication list, allergies, family history with the patient/caregiver today.   ROS  Ten systems reviewed and is negative except as mentioned in HPI    Objective Physical Exam  CONSTITUTIONAL: Patient appears well-developed and well-nourished.  HEENT: Head atraumatic, normocephalic, neck supple. Nasal mucosa swollen. CARDIOVASCULAR: Normal rate, regular rhythm and normal heart sounds. No murmur heard. No BLE edema. PULMONARY: Effort normal and breath sounds normal. No respiratory distress.. MUSCULOSKELETAL:slow gait, can move head but is scared to do so due to vertigo .  PSYCHIATRIC: Patient has a normal mood and affect. Behavior is normal. Judgment and thought content normal. NEUROLOGICAL: Cranial nerves II-XII grossly intact. Motor strength 5/5 in upper extremities. Sensation intact, no nystagmus but  vertigo gets worse with ocular movement, headache gets worse with head movement  Vitals:   11/10/24 1543  BP: 114/80  Pulse: 86  Resp: 16  SpO2: 96%  Weight: 180 lb 12.8 oz (82 kg)  Height: 5' 1 (1.549 m)    Body mass index is 34.16 kg/m.  Recent Results (from the past 2160 hours)  CBC     Status: Abnormal   Collection Time: 11/05/24  7:27 AM  Result Value Ref Range   WBC 6.8 4.0 - 10.5 K/uL   RBC 4.36 3.87 - 5.11 MIL/uL   Hemoglobin 11.9 (L) 12.0 - 15.0 g/dL   HCT 62.4 63.9 - 53.9 %   MCV 86.0 80.0 -  100.0 fL   MCH 27.3 26.0 - 34.0 pg   MCHC 31.7 30.0 - 36.0 g/dL   RDW 84.9 88.4 - 84.4 %   Platelets 417 (H) 150 - 400 K/uL   nRBC 0.0 0.0 - 0.2 %    Comment: Performed at [facility], [address], [place], [place] [address]  Basic metabolic panel     Status: None   Collection Time: 11/05/24  7:27 AM  Result Value Ref Range   Sodium 143 135 - 145 mmol/L   Potassium 4.1 3.5 - 5.1 mmol/L   Chloride 107 98 - 111 mmol/L   CO2 26 22 - 32 mmol/L   Glucose, Bld 98 70 - 99 mg/dL    Comment: Glucose reference range applies only to samples taken after fasting for at least 8 hours.   BUN 9 6 - 20 mg/dL   Creatinine, Ser 9.27 0.44 - 1.00 mg/dL   Calcium 9.5 8.9 - 89.6 mg/dL   GFR, Estimated >39 >39 mL/min    Comment: (NOTE) Calculated using the CKD-EPI Creatinine  Equation (2021)    Anion gap 9 5 - 15    Comment: Performed at [facility], [address] [place], [place] [address]  Resp panel by RT-PCR (RSV, Flu A&B, Covid) Anterior Nasal Swab     Status: None   Collection Time: 11/05/24  9:06 AM   Specimen: Anterior Nasal Swab  Result Value Ref Range   SARS Coronavirus 2 by RT PCR NEGATIVE NEGATIVE    Comment: (NOTE) SARS-CoV-2 target nucleic acids are NOT DETECTED.  The SARS-CoV-2 RNA is generally detectable in upper respiratory specimens during the acute phase of infection. The lowest concentration of SARS-CoV-2 viral copies this assay can detect is 138 copies/mL. A negative result does not preclude SARS-Cov-2 infection and should not be used as the sole basis for treatment or other patient management decisions. A negative result may occur with  improper specimen collection/handling, submission of specimen other than nasopharyngeal swab, presence of viral mutation(s) within the areas targeted by this assay, and inadequate number of viral copies(<138 copies/mL). A negative result must be combined with clinical observations, patient history, and  epidemiological information. The expected result is Negative.  Fact Sheet for Patients:  bloggercourse.com  Fact Sheet for Healthcare Providers:  seriousbroker.it  This test is no t yet approved or cleared by the [place]  FDA and  has been authorized for detection and/or diagnosis of SARS-CoV-2 by FDA under an Emergency Use Authorization (EUA). This EUA will remain  in effect (meaning this test can be used) for the duration of the COVID-19 declaration under Section 564(b)(1) of the Act, 21 U.S.C.section 360bbb-3(b)(1), unless the authorization is terminated  or revoked sooner.       Influenza A by PCR NEGATIVE NEGATIVE   Influenza B by PCR NEGATIVE NEGATIVE    Comment: (NOTE) The Xpert Xpress SARS-CoV-2/FLU/RSV plus assay is intended as an aid in the diagnosis of influenza from Nasopharyngeal swab specimens and should not be used as a sole basis for treatment. Nasal washings and aspirates are unacceptable for Xpert Xpress SARS-CoV-2/FLU/RSV testing.  Fact Sheet for Patients: bloggercourse.com  Fact Sheet for Healthcare Providers: seriousbroker.it  This test is not yet approved or cleared by the [place]  FDA and has been authorized for detection and/or diagnosis of SARS-CoV-2 by FDA under an Emergency Use Authorization (EUA). This EUA will remain in effect (meaning this test can be used) for the duration of the COVID-19 declaration under Section 564(b)(1) of the Act, 21 U.S.C. section 360bbb-3(b)(1), unless the authorization is terminated or revoked.     Resp Syncytial Virus by PCR NEGATIVE NEGATIVE    Comment: (NOTE) Fact Sheet for Patients: bloggercourse.com  Fact Sheet for Healthcare Providers: seriousbroker.it  This test is not yet approved or cleared by the [place]  FDA and has been authorized for  detection and/or diagnosis of SARS-CoV-2 by FDA under an Emergency Use Authorization (EUA). This EUA will remain in effect (meaning this test can be used) for the duration of the COVID-19 declaration under Section 564(b)(1) of the Act, 21 U.S.C. section 360bbb-3(b)(1), unless the authorization is terminated or revoked.  Performed at [facility], [address] [place], [place] [address]     Diabetic Foot Exam:     PHQ2/9:    11/10/2024    3:42 PM 05/21/2024    8:10 AM 01/29/2024    1:27 PM 08/22/2023    9:00 AM 11/27/2022    7:46 AM  Depression screen PHQ 2/9  Decreased Interest 0 0 0 0 0  Down, Depressed, Hopeless 0 0 0 0 0  PHQ - 2 Score 0 0 0 0 0  Altered sleeping   0 0 0  Tired, decreased energy   0 0 0  Change in appetite   0 0 0  Feeling bad or failure about yourself    0 0 0  Trouble concentrating   0 0 0  Moving slowly or fidgety/restless   0 0 0  Suicidal thoughts   0 0 0  PHQ-9 Score   0  0  0   Difficult doing work/chores   Not difficult at all Not difficult at all      Data saved with a previous flowsheet row definition    phq 9 is negative  Fall Risk:    11/10/2024    3:42 PM 05/21/2024    8:10 AM 02/05/2024    4:06 PM 08/22/2023    9:00 AM 11/27/2022    7:46 AM  Fall Risk   Falls in the past year? 0 0 0 0 0  Number falls in past yr: 0 0 0 0 0  Injury with Fall? 0 0  0  0  0   Risk for fall due to : No Fall Risks No Fall Risks   No Fall Risks  Follow up Falls evaluation completed Falls evaluation completed Falls evaluation completed  Falls prevention discussed     Data saved with a previous flowsheet row definition      Assessment & Plan Intractable migraine without aura with status migrainosus Recurrent migraines with recent exacerbation. Current episode intractable with status migrainosus. No structural brain damage on imaging. Symptoms align with migraine. - Prescribed Topamax  50 mg at night. - Prescribed Maxalt  for acute migraine  episodes. - Prescribed Qulipta  30 mg daily as a preventive measure. - Prescribed Ubrelvy  as needed, maximum of two doses in 24 hours. - Referred to neurologist for further evaluation and management. - Advised rest in a dark room and hydration.  Vertigo may be associated with migraine/ possible started as aura?  Exacerbated by sound and light. Symptoms suggest a central cause. - Manage vertigo as part of migraine treatment with prescribed medications. - Referred to ENT to rule out other causes of vertigo.  Nasal mucosal swelling Chronic nasal mucosal swelling with dryness. - Prescribed fluticasone  nasal spray. - Recommended over-the-counter Xyzal for allergy  symptoms.        [1]  Current Outpatient Medications:    baclofen  (LIORESAL ) 10 MG tablet, Take 1 tablet (10 mg total) by mouth 3 (three) times daily as needed for muscle spasms., Disp: 60 each, Rfl: 0   celecoxib  (CELEBREX ) 100 MG capsule, Take 1 capsule (100 mg total) by mouth 2 (two) times daily., Disp: 60 capsule, Rfl: 0   EPINEPHrine  0.3 mg/0.3 mL IJ SOAJ injection, Inject 0.3 mg into the muscle as needed for anaphylaxis., Disp: 2 each, Rfl: 1   fluticasone  (FLONASE ) 50 MCG/ACT nasal spray, Place 2 sprays into both nostrils daily., Disp: 16 g, Rfl: 0   meclizine  (ANTIVERT ) 25 MG tablet, Take 1 tablet (25 mg total) by mouth 3 (three) times daily as needed for dizziness or nausea., Disp: 30 tablet, Rfl: 1   valACYclovir  (VALTREX ) 500 MG tablet, Take 1 tablet (500 mg total) by mouth daily or two times daily for 3 days as needed for symptoms, Disp: 90 tablet, Rfl: 3   estradiol  (ESTRACE ) 0.1 MG/GM vaginal cream, Place 1 Applicatorful vaginally at bedtime for 14 days, THEN 1 Applicatorful 3 (three)  times a week. (Patient not taking: No sig reported), Disp: 42.5 g, Rfl: 12   prochlorperazine  (COMPAZINE ) 10 MG tablet, Take 1 tablet (10 mg total) by mouth every 6 (six) hours as needed for nausea or vomiting. (Patient not taking: Reported  on 11/10/2024), Disp: 30 tablet, Rfl: 0 [2]  Allergies Allergen Reactions   Peanut Butter Flavoring Agent (Non-Screening) Itching and Swelling   Shellfish Allergy  Itching and Swelling

## 2024-11-11 ENCOUNTER — Other Ambulatory Visit: Payer: Self-pay

## 2024-11-12 ENCOUNTER — Telehealth: Payer: Self-pay | Admitting: Pharmacy Technician

## 2024-11-12 ENCOUNTER — Other Ambulatory Visit (HOSPITAL_COMMUNITY): Payer: Self-pay

## 2024-11-12 NOTE — Telephone Encounter (Signed)
 Pharmacy Patient Advocate Encounter   Received notification from [facility] KEY that prior authorization for Qulipta  30MG  tablets is required/requested.   Insurance verification completed.   The patient is insured through [facility].   Per test claim: PA required; PA started via CoverMyMeds. KEY [id number] . Waiting for clinical questions to populate.

## 2024-11-12 NOTE — Telephone Encounter (Signed)
 Pharmacy Patient Advocate Encounter   Received notification from [facility] KEY that prior authorization for Ubrelvy  100MG  tablets is required/requested.   Insurance verification completed.   The patient is insured through [facility].   Per test claim: PA required; PA started via CoverMyMeds. KEY [id number] . Waiting for clinical questions to populate.

## 2024-11-12 NOTE — Telephone Encounter (Signed)
 Pharmacy Patient Advocate Encounter  Received notification from [employer] that Prior Authorization for Qulipta  30MG  tablets has been APPROVED from 11/12/24 to 05/11/25. Ran test claim, Copay is $0.00 for 3 months. This test claim was processed through [facility]- copay amounts may vary at other pharmacies due to pharmacy/plan contracts, or as the patient moves through the different stages of their insurance plan.   PA #/Case ID/Reference #: [id number]

## 2024-11-13 ENCOUNTER — Other Ambulatory Visit (HOSPITAL_COMMUNITY): Payer: Self-pay

## 2024-11-13 ENCOUNTER — Other Ambulatory Visit: Payer: Self-pay

## 2024-11-13 NOTE — Telephone Encounter (Signed)
 Pharmacy Patient Advocate Encounter  Received notification from [facility] that Prior Authorization for Ubrelvy  100MG  tablets has been APPROVED from 11/12/24 to 05/11/25. Ran test claim, Copay is $0.00. This test claim was processed through [facility]- copay amounts may vary at other pharmacies due to pharmacy/plan contracts, or as the patient moves through the different stages of their insurance plan.   PA #/Case ID/Reference #: [id number]

## 2024-11-16 ENCOUNTER — Other Ambulatory Visit (HOSPITAL_COMMUNITY): Payer: Self-pay

## 2024-11-16 ENCOUNTER — Encounter: Payer: Self-pay | Admitting: Family Medicine

## 2024-11-20 ENCOUNTER — Ambulatory Visit: Admitting: Family Medicine

## 2024-12-04 ENCOUNTER — Ambulatory Visit: Admitting: Family Medicine
